# Patient Record
Sex: Female | Born: 1960 | Race: White | Hispanic: No | Smoking: Never smoker
Health system: Southern US, Community
[De-identification: ages and names within clinical notes are randomized; demographics above are authoritative.]

## PROBLEM LIST (undated history)

## (undated) DIAGNOSIS — K635 Polyp of colon: Secondary | ICD-10-CM

## (undated) DIAGNOSIS — G250 Essential tremor: Secondary | ICD-10-CM

## (undated) DIAGNOSIS — F419 Anxiety disorder, unspecified: Secondary | ICD-10-CM

## (undated) DIAGNOSIS — E669 Obesity, unspecified: Secondary | ICD-10-CM

## (undated) DIAGNOSIS — M1990 Unspecified osteoarthritis, unspecified site: Secondary | ICD-10-CM

## (undated) DIAGNOSIS — F32A Depression, unspecified: Secondary | ICD-10-CM

## (undated) DIAGNOSIS — F329 Major depressive disorder, single episode, unspecified: Secondary | ICD-10-CM

## (undated) DIAGNOSIS — G43909 Migraine, unspecified, not intractable, without status migrainosus: Secondary | ICD-10-CM

## (undated) DIAGNOSIS — K589 Irritable bowel syndrome without diarrhea: Secondary | ICD-10-CM

## (undated) HISTORY — DX: Polyp of colon: K63.5

## (undated) HISTORY — DX: Depression, unspecified: F32.A

## (undated) HISTORY — DX: Unspecified osteoarthritis, unspecified site: M19.90

## (undated) HISTORY — DX: Migraine, unspecified, not intractable, without status migrainosus: G43.909

## (undated) HISTORY — PX: APPENDECTOMY: SHX54

## (undated) HISTORY — PX: OTHER SURGICAL HISTORY: SHX169

## (undated) HISTORY — DX: Irritable bowel syndrome, unspecified: K58.9

## (undated) HISTORY — DX: Essential tremor: G25.0

## (undated) HISTORY — DX: Obesity, unspecified: E66.9

## (undated) HISTORY — DX: Major depressive disorder, single episode, unspecified: F32.9

---

## 1997-08-01 ENCOUNTER — Other Ambulatory Visit: Admission: RE | Admit: 1997-08-01 | Discharge: 1997-08-01 | Payer: Self-pay | Admitting: Obstetrics & Gynecology

## 1997-09-14 ENCOUNTER — Other Ambulatory Visit: Admission: RE | Admit: 1997-09-14 | Discharge: 1997-09-14 | Payer: Self-pay | Admitting: Obstetrics and Gynecology

## 1997-10-12 ENCOUNTER — Inpatient Hospital Stay (HOSPITAL_COMMUNITY): Admission: AD | Admit: 1997-10-12 | Discharge: 1997-10-14 | Payer: Self-pay | Admitting: Obstetrics and Gynecology

## 1998-06-12 ENCOUNTER — Other Ambulatory Visit: Admission: RE | Admit: 1998-06-12 | Discharge: 1998-06-12 | Payer: Self-pay | Admitting: Obstetrics and Gynecology

## 1999-09-09 ENCOUNTER — Encounter: Payer: Self-pay | Admitting: Obstetrics and Gynecology

## 1999-09-09 ENCOUNTER — Encounter: Admission: RE | Admit: 1999-09-09 | Discharge: 1999-09-09 | Payer: Self-pay | Admitting: Obstetrics and Gynecology

## 1999-10-08 ENCOUNTER — Other Ambulatory Visit: Admission: RE | Admit: 1999-10-08 | Discharge: 1999-10-08 | Payer: Self-pay | Admitting: Obstetrics and Gynecology

## 2001-02-15 ENCOUNTER — Other Ambulatory Visit: Admission: RE | Admit: 2001-02-15 | Discharge: 2001-02-15 | Payer: Self-pay | Admitting: Obstetrics and Gynecology

## 2002-07-11 ENCOUNTER — Other Ambulatory Visit: Admission: RE | Admit: 2002-07-11 | Discharge: 2002-07-11 | Payer: Self-pay | Admitting: Obstetrics and Gynecology

## 2002-07-13 ENCOUNTER — Ambulatory Visit (HOSPITAL_COMMUNITY): Admission: RE | Admit: 2002-07-13 | Discharge: 2002-07-13 | Payer: Self-pay | Admitting: Obstetrics and Gynecology

## 2002-07-13 ENCOUNTER — Encounter: Payer: Self-pay | Admitting: Obstetrics and Gynecology

## 2004-06-08 ENCOUNTER — Inpatient Hospital Stay (HOSPITAL_COMMUNITY): Admission: EM | Admit: 2004-06-08 | Discharge: 2004-06-09 | Payer: Self-pay | Admitting: Emergency Medicine

## 2004-07-15 ENCOUNTER — Ambulatory Visit: Payer: Self-pay | Admitting: Family Medicine

## 2004-08-01 ENCOUNTER — Encounter (INDEPENDENT_AMBULATORY_CARE_PROVIDER_SITE_OTHER): Payer: Self-pay | Admitting: Specialist

## 2004-08-01 ENCOUNTER — Ambulatory Visit (HOSPITAL_COMMUNITY): Admission: RE | Admit: 2004-08-01 | Discharge: 2004-08-01 | Payer: Self-pay | Admitting: Gastroenterology

## 2004-11-15 ENCOUNTER — Other Ambulatory Visit: Admission: RE | Admit: 2004-11-15 | Discharge: 2004-11-15 | Payer: Self-pay | Admitting: Obstetrics and Gynecology

## 2006-04-17 ENCOUNTER — Ambulatory Visit: Payer: Self-pay | Admitting: Family Medicine

## 2007-06-15 ENCOUNTER — Telehealth (INDEPENDENT_AMBULATORY_CARE_PROVIDER_SITE_OTHER): Payer: Self-pay | Admitting: *Deleted

## 2007-06-23 ENCOUNTER — Other Ambulatory Visit: Admission: RE | Admit: 2007-06-23 | Discharge: 2007-06-23 | Payer: Self-pay | Admitting: Family Medicine

## 2007-06-23 ENCOUNTER — Encounter: Payer: Self-pay | Admitting: Family Medicine

## 2007-06-23 ENCOUNTER — Ambulatory Visit: Payer: Self-pay | Admitting: Family Medicine

## 2007-06-23 DIAGNOSIS — G43829 Menstrual migraine, not intractable, without status migrainosus: Secondary | ICD-10-CM | POA: Insufficient documentation

## 2007-06-23 DIAGNOSIS — F329 Major depressive disorder, single episode, unspecified: Secondary | ICD-10-CM

## 2007-06-23 DIAGNOSIS — F32A Depression, unspecified: Secondary | ICD-10-CM | POA: Insufficient documentation

## 2007-06-23 DIAGNOSIS — F419 Anxiety disorder, unspecified: Secondary | ICD-10-CM | POA: Insufficient documentation

## 2007-06-30 ENCOUNTER — Encounter (INDEPENDENT_AMBULATORY_CARE_PROVIDER_SITE_OTHER): Payer: Self-pay | Admitting: *Deleted

## 2010-09-13 NOTE — Discharge Summary (Signed)
Sandra Mills            ACCOUNT NO.:  [id number]   MEDICAL RECORD NO.:  [id number]          PATIENT TYPE:  INP   LOCATION:  0456                         FACILITY:  [facility]   PHYSICIAN:  Anselm Pancoast. 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  Operation was drainage of perirectal abscess and sphincterotomy.   HISTORY OF PRESENT ILLNESS:  Sandra Mills is a 50 year old female,  mother of 4, who had been seen in the office of the [facility] on Friday by one of their PA's, and this had been discussed  with the gynecologist, but it was questioned whether it was a perirectal  abscess, and our office was called, and the patient was advised to come to  the emergency room and be seen by the ER physician.  The ER physician saw  her and then called me, and I examined her, and on examination, you could  feel a fullness that was really kind of lateral and in the posterior part of  the vagina but not really truly pointed perirectally, and she gave a history  that about 3 or 4 years ago, following her last delivery, she developed an  infection in her vagina which she described as a Bartholin's cyst, and said  this was in a similar area and that she thought this was what was going on.  I examined her, and as far as really on rectal exam, she was just in so much  sphincter spasm, it was difficult to tell anything, but there did appear to  be a little pustule kind of going into the labia majora on the right, and I  called, and talked with the gynecologist on call, and had her come over and  examine the patient.  Dr. Ladell Pier did come over, and she thought that this was  basically an abscess associated with her vagina, and scheduled her for the  surgery, and I asked to  also be there so I could complete my examination.  However, after she had her up in stirrups and induction of general  anesthesia, on examination, the area appeared to be a little deeper than  what she had thought when she clinically examined her.  I scrubbed in, and  with an anoscope, you could see definitely there was purulence coming from  midline anterior as if this had been basically a fistula-in-ano that had  pointed up into the intersphincteric areas, kind of really more lateral and  deeper than the usual perirectal abscess.  I put a right-angle retractor  clamp in the opening and then divided the internal sphincter so we could  actually open up into the abscess cavity and then extended the incision a  little bit, but most of it is really within the external anal canal area and  actually truly a perirectal incision.  After we were comfortable that all  the drainage had been removed, we cultured it, aerobic  and anaerobically.  She was sent to the recovery room, and we have continued IV antibiotics.  She said she was definitely feeling much better 6-8 hours after surgery, and  she has soaked in the tub, and she is afebrile, and has continued to  improve.  I examined her this morning, and now it is about 36 hours after  surgery, and there is no fluctuation nor any purulence draining.  The  incision area is clean, and there has been no bleeding.  I would like to see  her back in the office in approximately 3 days, and she is instructed if  there appears to be increasing pain or the fluctuation is reoccurring to  call or be seen sooner.  I was conservative on my drainage, fearing that we  may develop anal incontinence because of the previous deliveries and  episiotomies, etc.  The culture is not growing any bacteria, but this was  frank pus at the time of surgery, and I will wait for final results.  I am  going to discharge her on Augmentin 875 b.i.d., Vicodin for pain.  She  continues on  her chronic medications, and will soak in the tub about 3 times  a day plus p.r.n. if she has had a bowel movement.  So far, she has not  really had a good bowel movement after surgery.  She was additionally having  problems with voiding, but that has subsided.  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  She understands that she may need further drainage if  this appears to reoccur, but hopefully, this was basically a fistula that  probably started as a fissure.  She states she has had problems with what  she thought were hemorrhoids intermittently, and was from the anal dentate  line as the area of origin.      WJW/MEDQ  D:  06/09/2004  T:  06/09/2004  Job:  [id number]   cc:   Anselm Pancoast. Zachery Dakins, M.D.  [address], [address]  [place]  [place] [address]   Crist Fat. Rivard, M.D.  [address]  [place]  [place] [address]  Fax: [phone]

## 2010-09-13 NOTE — Op Note (Signed)
[name], Sandra Mills            ACCOUNT NO.:  [id number]   MEDICAL RECORD NO.:  [id number]          PATIENT TYPE:  EMS   LOCATION:  ED                           FACILITY:  [facility]   PHYSICIAN:  Anselm Pancoast. Weatherly, M.D.DATE OF BIRTH:  May 15, 1960   DATE OF PROCEDURE:  06/07/2004  DATE OF DISCHARGE:                                 OPERATIVE REPORT   PREOPERATIVE DIAGNOSIS:  Either a paravaginal abscess or possibly a  perirectal abscess.   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   OPERATION:  Evaluation under anesthesia and internal sphincterotomy and  drainage of intersphincteric abscess.   ANESTHESIA:  General anesthesia in stirrups.   HISTORY:  Sandra Sandra Mills Sandra Mills is a 49 year old female, who presented to the  emergency room after she was seen today by the PA in the [facility], and it was questionable of whether this was a perirectal or a  perivaginal abscess.  Approximately 5-6 years ago following a delivery, she  had what she described as a Bartholin cyst and says that the symptoms are  similar and that she saw the gynecologist today.  They recommended that she  get a general surgical opinion, and she was sent over to the emergency room,  and I was called and examined her.  On exam, she has definitely got a lot of  sphincter spasm and points to a little area kind of in the posterolateral  vaginal area as the area of more tenderness, and you could certainly feel a  fullness there, and there is a little of what looks like a little pustule  kind of in the labia majora at this area.  I tried to do a rectal exam, and  she is just so tender, you cannot really tell, and I talked with Dr. Estanislado Pandy,  and she came over and examined her and thought that this was basically a  vaginal abscess.  I recommended that when she took her to surgery to let me  be aware because I wanted to do a rectal examination to make  sure that it is  not a perirectal abscess and a kind of a more anterior location.  She was up  in stirrups and being examined and when she examined her, felt that this  abscess really was deeper than she had appreciated with the local  anesthesia.  I scrubbed in and put an anoscope into the anal canal and with  pushing on the abscess, she could see frank pus coming up from the midline,  dentate line area.  I put a right-angle in this, and it kind of tunnels off  to the right intersphincteric area, and this portion of the external or  internal sphincter was divided with cautery over to the abscess carrier.  As  far as getting truly into the little central localized abscess, it appears  that there are several little kind of pockets.  There is definitely nothing  that goes into the vagina, and we kind of used the [employer] and a right-  angle to kind of open into the area so that  all the pockets and fluctuation  were basically drained.  I also let the anesthesist go light, and I think  that we have done an adequate internal sphincterotomy so that we could get  good drainage and do not want to divide the skin and perianal area any  further, fearing that she possibly will be incontinent.  I think that we  should stop surgery at this point, as the abscess has been drained and the  fistula opening now opened in communication with the actual abscess cavity.  The little packing was placed into the anterior anal canal, and I am going  to continue on IV antibiotics, will reexamine her in the morning and if she  is feeling better, release her on oral antibiotics and follow her in the  office.  Hopefully, she will have anal sphincter  control and also allow adequate drainage of this abscess.  I think this is  most likely an abscess that did originate anterior intersphincteric, sort of  a fistula-in-ano, even though it is in a very anterior position, and the  actual pocket was more approximately at 10  o'clock, even though the internal  fistula opening was directly midline anterior.      WJW/MEDQ  D:  06/07/2004  T:  06/08/2004  Job:  [id number]   cc:   Dois Davenport A. Rivard, M.D.  [address]  [place]  [place] [address]  Fax: [phone]

## 2011-01-01 ENCOUNTER — Other Ambulatory Visit: Payer: Self-pay | Admitting: Internal Medicine

## 2011-01-01 ENCOUNTER — Ambulatory Visit (HOSPITAL_COMMUNITY)
Admission: RE | Admit: 2011-01-01 | Discharge: 2011-01-01 | Disposition: A | Discharge status: Home / Self Care | Payer: BC Managed Care – PPO | Source: Ambulatory Visit | Attending: Internal Medicine | Admitting: Internal Medicine

## 2011-01-01 DIAGNOSIS — K829 Disease of gallbladder, unspecified: Secondary | ICD-10-CM

## 2011-01-01 DIAGNOSIS — R1011 Right upper quadrant pain: Secondary | ICD-10-CM | POA: Insufficient documentation

## 2011-01-01 DIAGNOSIS — K7689 Other specified diseases of liver: Secondary | ICD-10-CM | POA: Insufficient documentation

## 2011-02-21 ENCOUNTER — Emergency Department (INDEPENDENT_AMBULATORY_CARE_PROVIDER_SITE_OTHER): Payer: BC Managed Care – PPO

## 2011-02-21 ENCOUNTER — Inpatient Hospital Stay (HOSPITAL_COMMUNITY): Payer: BC Managed Care – PPO

## 2011-02-21 ENCOUNTER — Encounter: Payer: Self-pay | Admitting: *Deleted

## 2011-02-21 ENCOUNTER — Emergency Department (HOSPITAL_BASED_OUTPATIENT_CLINIC_OR_DEPARTMENT_OTHER)
Admission: EM | Admit: 2011-02-21 | Discharge: 2011-02-21 | Disposition: A | Discharge status: Other Acute Inpatient Hospital | Payer: BC Managed Care – PPO | Source: Home / Self Care | Attending: Emergency Medicine | Admitting: Emergency Medicine

## 2011-02-21 ENCOUNTER — Observation Stay (HOSPITAL_COMMUNITY)
Admission: RE | Admit: 2011-02-21 | Discharge: 2011-02-22 | Disposition: A | Discharge status: Home / Self Care | Payer: BC Managed Care – PPO | Source: Other Acute Inpatient Hospital | Attending: Internal Medicine | Admitting: Internal Medicine

## 2011-02-21 ENCOUNTER — Other Ambulatory Visit: Payer: Self-pay

## 2011-02-21 DIAGNOSIS — R55 Syncope and collapse: Secondary | ICD-10-CM

## 2011-02-21 DIAGNOSIS — R197 Diarrhea, unspecified: Secondary | ICD-10-CM

## 2011-02-21 DIAGNOSIS — R51 Headache: Secondary | ICD-10-CM

## 2011-02-21 DIAGNOSIS — I951 Orthostatic hypotension: Secondary | ICD-10-CM

## 2011-02-21 DIAGNOSIS — R109 Unspecified abdominal pain: Secondary | ICD-10-CM

## 2011-02-21 DIAGNOSIS — R42 Dizziness and giddiness: Secondary | ICD-10-CM | POA: Insufficient documentation

## 2011-02-21 DIAGNOSIS — R112 Nausea with vomiting, unspecified: Secondary | ICD-10-CM

## 2011-02-21 DIAGNOSIS — E860 Dehydration: Secondary | ICD-10-CM | POA: Insufficient documentation

## 2011-02-21 DIAGNOSIS — R404 Transient alteration of awareness: Secondary | ICD-10-CM

## 2011-02-21 DIAGNOSIS — N83209 Unspecified ovarian cyst, unspecified side: Secondary | ICD-10-CM | POA: Insufficient documentation

## 2011-02-21 DIAGNOSIS — F411 Generalized anxiety disorder: Secondary | ICD-10-CM | POA: Insufficient documentation

## 2011-02-21 HISTORY — DX: Anxiety disorder, unspecified: F41.9

## 2011-02-21 LAB — COMPREHENSIVE METABOLIC PANEL
ALT: 29 U/L (ref 0–35)
Albumin: 3.9 g/dL (ref 3.5–5.2)
Alkaline Phosphatase: 73 U/L (ref 39–117)
BUN: 11 mg/dL (ref 6–23)
Chloride: 103 mEq/L (ref 96–112)
Glucose, Bld: 101 mg/dL — ABNORMAL HIGH (ref 70–99)
Potassium: 3.6 mEq/L (ref 3.5–5.1)
Sodium: 139 mEq/L (ref 135–145)
Total Bilirubin: 0.5 mg/dL (ref 0.3–1.2)

## 2011-02-21 LAB — URINALYSIS, ROUTINE W REFLEX MICROSCOPIC
Bilirubin Urine: NEGATIVE
Glucose, UA: NEGATIVE mg/dL
Hgb urine dipstick: NEGATIVE
Specific Gravity, Urine: >1.046 — ABNORMAL HIGH (ref 1.005–1.030)
Urobilinogen, UA: 0.2 mg/dL (ref 0.0–1.0)

## 2011-02-21 LAB — CARDIAC PANEL(CRET KIN+CKTOT+MB+TROPI)
CK, MB: 2 ng/mL (ref 0.3–4.0)
CK, MB: 2.4 ng/mL (ref 0.3–4.0)
Relative Index: 1.7 (ref 0.0–2.5)
Relative Index: INVALID (ref 0.0–2.5)
Total CK: 118 U/L (ref 7–177)
Troponin I: <0.3 ng/mL (ref <0.30)
Troponin I: <0.3 ng/mL (ref <0.30)
Troponin I: <0.3 ng/mL (ref <0.30)

## 2011-02-21 LAB — CBC
HCT: 41.8 % (ref 36.0–46.0)
Hemoglobin: 14.5 g/dL (ref 12.0–15.0)
RDW: 12.9 % (ref 11.5–15.5)
WBC: 9.8 10*3/uL (ref 4.0–10.5)

## 2011-02-21 LAB — PREGNANCY, URINE: Preg Test, Ur: NEGATIVE

## 2011-02-21 LAB — T3, FREE: T3, Free: 3.2 pg/mL (ref 2.3–4.2)

## 2011-02-21 LAB — LACTIC ACID, PLASMA: Lactic Acid, Venous: 1.7 mmol/L (ref 0.5–2.2)

## 2011-02-21 LAB — T4, FREE: Free T4: 1.22 ng/dL (ref 0.80–1.80)

## 2011-02-21 LAB — OCCULT BLOOD X 1 CARD TO LAB, STOOL: Fecal Occult Bld: NEGATIVE

## 2011-02-21 MED ORDER — IOHEXOL 300 MG/ML  SOLN
80.0000 mL | Freq: Once | INTRAMUSCULAR | Status: AC | PRN
  Administered 2011-02-21: 80 mL via INTRAVENOUS

## 2011-02-21 MED ORDER — IOHEXOL 300 MG/ML  SOLN
100.0000 mL | Freq: Once | INTRAMUSCULAR | Status: AC | PRN
  Administered 2011-02-21: 100 mL via INTRAVENOUS

## 2011-02-21 MED ORDER — SODIUM CHLORIDE 0.9 % IV SOLN
INTRAVENOUS | Status: DC
  Administered 2011-02-21: 1000 mL via INTRAVENOUS

## 2011-02-21 MED ORDER — ONDANSETRON HCL 4 MG/2ML IJ SOLN
4.0000 mg | Freq: Once | INTRAMUSCULAR | Status: AC
  Administered 2011-02-21: 4 mg via INTRAVENOUS
  Filled 2011-02-21: qty 2

## 2011-02-21 MED ORDER — SODIUM CHLORIDE 0.9 % IV BOLUS (SEPSIS)
1000.0000 mL | Freq: Once | INTRAVENOUS | Status: AC
  Administered 2011-02-21: 1000 mL via INTRAVENOUS

## 2011-02-21 MED ORDER — SODIUM CHLORIDE 0.9 % IV BOLUS (SEPSIS)
1000.0000 mL | Freq: Once | INTRAVENOUS | Status: DC
  Administered 2011-02-21: 1000 mL via INTRAVENOUS

## 2011-02-21 NOTE — ED Notes (Signed)
To  ED via EMS for syncopal episode

## 2011-02-21 NOTE — ED Provider Notes (Signed)
History     CSN: [id number] Arrival date & time: 02/21/2011 12:47 AM   First MD Initiated Contact with Patient 02/21/11 0055      Chief Complaint  Patient presents with  . Loss of Consciousness  . Abdominal Pain    (Consider location/radiation/quality/duration/timing/severity/associated sxs/prior treatment) HPI Comments: Patient presents by EMS after having syncopal episode at home. Patient states she felt well prior to going to bed but is initially then she began to feel nauseated. She went to the bathroom and became lightheaded, dizzy, clammy hands and had a syncopal episode that was witnessed by her husband. She did not hit her head and she lowered her to the ground. The husband reports she was out for one to 2 minutes. Since this episode patient has had 4 loose bowel movements and continues to feel very nauseated. She denies any chest pain, shortness of breath, headache.  She does admit to some lower abdominal cramping.  She denies any vaginal bleeding or discharge, dysuria, hematuria, blood in the stool.  The history is provided by the patient and the spouse.    Past Medical History  Diagnosis Date  . Anxiety     No past surgical history on file.  No family history on file.  History  Substance Use Topics  . Smoking status: Not on file  . Smokeless tobacco: Not on file  . Alcohol Use:     OB History    Grav Para Term Preterm Abortions TAB SAB Ect Mult Living                  Review of Systems  Constitutional: Positive for diaphoresis and fatigue. Negative for fever, activity change and appetite change.  HENT: Negative for rhinorrhea.   Respiratory: Negative for cough, chest tightness and shortness of breath.   Cardiovascular: Positive for syncope. Negative for chest pain.  Gastrointestinal: Positive for nausea, abdominal pain and diarrhea. Negative for vomiting.  Genitourinary: Negative for dysuria and hematuria.  Musculoskeletal: Negative for back pain.  Skin:  Negative for rash.  Neurological: Positive for dizziness, syncope, weakness and light-headedness. Negative for headaches.    Allergies  Review of patient's allergies indicates no known allergies.  Home Medications   Current Outpatient Rx  Name Route Sig Dispense Refill  . CITALOPRAM HYDROBROMIDE 10 MG PO TABS Oral Take 10 mg by mouth daily.      Marland Kitchen ONDANSETRON HCL 4 MG PO TABS Oral Take 4 mg by mouth once.        BP 113/67  Pulse 81  Temp(Src) 97.8 F (36.6 C) (Oral)  Resp 16  Ht 5\' 2"  (1.575 m)  Wt 170 lb (77.111 kg)  BMI 31.09 kg/m2  SpO2 99%  Physical Exam  Constitutional: She is oriented to person, place, and time. No distress.       Pale  HENT:  Head: Normocephalic and atraumatic.  Mouth/Throat: Oropharynx is clear and moist. No oropharyngeal exudate.       Dry Mucous membranes  Neck: Normal range of motion.  Cardiovascular: Normal rate, regular rhythm and normal heart sounds.   Pulmonary/Chest: Effort normal and breath sounds normal. No respiratory distress.  Abdominal: Soft. There is tenderness. There is no rebound and no guarding.  Musculoskeletal: Normal range of motion. She exhibits no edema and no tenderness.  Neurological: She is alert and oriented to person, place, and time. No cranial nerve deficit.  Skin: Skin is warm.    ED Course  [place] bedside Performed by: Glynn Octave  Authorized by: Glynn Octave Consent: Verbal consent obtained. Consent given by: patient Patient understanding: patient states understanding of the procedure being performed Patient identity confirmed: verbally with patient Patient tolerance: Patient tolerated the procedure well with no immediate complications. Comments: Subxiphoid and apical 4 chamber views obtained.  No pericardial effusion, no RV dilation or signs of strain.     (including critical care time)  Labs Reviewed  COMPREHENSIVE METABOLIC PANEL - Abnormal; Notable for the following:    Glucose, Bld 101 (*)     All other components within normal limits  URINALYSIS, ROUTINE W REFLEX MICROSCOPIC - Abnormal; Notable for the following:    Specific Gravity, Urine >1.046 (*)    All other components within normal limits  D-DIMER, QUANTITATIVE - Abnormal; Notable for the following:    D-Dimer, Quant 14.91 (*)    All other components within normal limits  CBC  LACTIC ACID, PLASMA  LIPASE, BLOOD  PREGNANCY, URINE  CARDIAC PANEL(CRET KIN+CKTOT+MB+TROPI)  OCCULT BLOOD X 1 CARD TO LAB, STOOL   Ct Head Wo Contrast  02/21/2011  *RADIOLOGY REPORT*  Clinical Data: Syncope; headache.  Nausea, vomiting and abdominal pain.  Loss of consciousness.  CT HEAD WITHOUT CONTRAST  Technique:  Contiguous axial images were obtained from the base of the skull through the vertex without contrast.  Comparison: None.  Findings: There is no evidence of acute infarction, mass lesion, or intra- or extra-axial hemorrhage on CT.  The posterior fossa, including the cerebellum, brainstem and fourth ventricle, is within normal limits.  The third and lateral ventricles, and basal ganglia are unremarkable in appearance.  The cerebral hemispheres are symmetric in appearance, with normal gray- white differentiation.  No mass effect or midline shift is seen.  There is no evidence of fracture; visualized osseous structures are unremarkable in appearance.  The orbits are within normal limits. The paranasal sinuses and mastoid air cells are well-aerated.  No significant soft tissue abnormalities are seen.  IMPRESSION: Unremarkable noncontrast CT of the head.  Original Report Authenticated By: Tonia Ghent, M.D.   Ct Abdomen Pelvis W Contrast  02/21/2011  *RADIOLOGY REPORT*  Clinical Data: Abdominal pain, nausea and vomiting; syncope.  CT ABDOMEN AND PELVIS WITH CONTRAST  Technique:  Multidetector CT imaging of the abdomen and pelvis was performed following the standard protocol during bolus administration of intravenous contrast.  Contrast:  OMNIPAQUE IOHEXOL 300 MG/ML IV SOLN  Comparison: Abdominal ultrasound performed 01/01/2011  Findings: The visualized lung bases are clear.  Scattered prominent cysts are noted within the liver, measuring up to 4.8 cm in size.  The liver is otherwise unremarkable in appearance.  The spleen is within normal limits.  The gallbladder is unremarkable.  The pancreas and adrenal glands are within normal limits.  The kidneys are unremarkable in appearance.  There is no evidence of hydronephrosis.  No renal or ureteral stones are seen.  No perinephric stranding is appreciated.  No free fluid is identified.  The small bowel is unremarkable in appearance.  The stomach is within normal limits.  No acute vascular abnormalities are seen.  There is minimal nonspecific stranding within the proximal mesentery.  The appendix is normal in caliber, without evidence for appendicitis.  The colon is unremarkable in appearance.  The bladder is largely decompressed and not well assessed.  A small amount of free fluid is noted within the pelvis; the distribution is slightly unusual, with a small amount of fluid noted superior to the bladder.  There is a slightly irregular cystic  focus within the right ovary, and this could conceivably reflect a recently ruptured ovarian cyst.  The ovaries are otherwise unremarkable in appearance.  No suspicious adnexal masses are seen.  No inguinal lymphadenopathy is seen.  No acute osseous abnormalities are identified.  IMPRESSION:  1.  Small amount of free fluid in the pelvis; the distribution of the fluid is slightly unusual.  Given a mildly irregular cystic focus in the right ovary, this could reflect a recently ruptured ovarian cyst. 2.  Minimal nonspecific stranding within the proximal mesentery. 3.  Prominent hepatic cysts noted.  Original Report Authenticated By: Tonia Ghent, M.D.   CRITICAL CARE Performed by: Glynn Octave   Total critical care time: 45  Critical care time was exclusive  of separately billable procedures and treating other patients.  Critical care was necessary to treat or prevent imminent or life-threatening deterioration.  Critical care was time spent personally by me on the following activities: development of treatment plan with patient and/or surrogate as well as nursing, discussions with consultants, evaluation of patient's response to treatment, examination of patient, obtaining history from patient or surrogate, ordering and performing treatments and interventions, ordering and review of laboratory studies, ordering and review of radiographic studies, pulse oximetry and re-evaluation of patient's condition.  1. Syncope   2. Orthostatic hypotension   3. Diarrhea       MDM  Syncopal episode with prodrome, now with nausea and diarrhea and lower abdominal pain.  Several episodes of diarrhea since arrival in ED and dizziness and near syncope with standing.  Denies CP, SOB, vomiting. Etiology of syncope appears to be vasovagal given ongoing diarrhea and lightheadedness.  Labs reviewed and essentially unremarkable.  Unable to tolerate orthostatic vital signs but clinically positive.  Doubt PE given hemodynamic stability, no hypoxia, chest pain, shortness of breath, tachypnea, tachycardia. No signs of right hear strain on bedside US.  Patient had already received IV contrast prior to elevated Ddimer and would not like to expose kidneys to second dose of contrast from CT chest.   Remains orthostatic and nauseated in the ED with persistent diarrhea.  Will admit for further hydration and workup of syncope.    Date: 02/21/2011  Rate: 85  Rhythm: normal sinus rhythm  QRS Axis: normal  Intervals: normal  ST/T Wave abnormalities: nonspecific ST/T changes  Conduction Disutrbances:none  Narrative Interpretation:   Old EKG Reviewed: unchanged         Glynn Octave, MD 02/21/11 [phone]

## 2011-02-21 NOTE — ED Notes (Signed)
Attempted to call report to floor nurse but unable to take report at this time. RN states she will call back later for report.

## 2011-02-21 NOTE — ED Notes (Signed)
Pt has 20 gauge IV in left hand, patent upon transfer with NS bolus infusing, IV site unremarkable.

## 2011-02-21 NOTE — ED Notes (Signed)
Pt report given to Sean, RN with [facility] 

## 2011-02-21 NOTE — ED Notes (Signed)
Pt assisted to [facility] with assist. Pt urinated small amount of clear yellow urine but no bowel movement at this time. Pt tolerated well.

## 2011-02-21 NOTE — ED Notes (Signed)
Pt report given to Jan, RN at [facility] unit 2000

## 2011-02-21 NOTE — ED Notes (Signed)
Dr Manus Gunning at bs with ultrasound machine for scan of heart and chest. Pt tolerated well.

## 2011-02-21 NOTE — ED Notes (Signed)
Husband called staff to room. Pt was sitting on the bedside commode having a bowel movement. Was pale and her head was lowered. Pt was sliding off the chair, and appeared to be having a near syncopal episode. Additional staff called to room. Pt assisted back to bed. Cleaned of stool. Pt c/o nausea. Cardiac monitor applied. Vitals obtained and stable. Fannie Knee RN at bedside to [employer]. MD made aware of episode.

## 2011-02-21 NOTE — ED Notes (Signed)
Pt room assignment changed to 2036 per charge nurse at [facility].

## 2011-02-21 NOTE — ED Notes (Signed)
Narrative of care til 0315:on arrival placed on monitor,ekg done,color pink alert and oriented.  States had 2 diarrhea stools today,one earlier and another before syncopal episode.  Up to bsc and tolerated well.  Back up to bsc and had a syncopal episode,very pale,placed back in bed.  Given meds for nausea ,bolus of nss continues.  Color pink again,alert and oriented.  Up to bsc again for urine and tolerated well.  Again felt increased nausea ,epigastric pain and flushed.  Placed on bedpan, another brown liquid stool.

## 2011-02-22 LAB — COMPREHENSIVE METABOLIC PANEL
ALT: 20 U/L (ref 0–35)
Albumin: 3 g/dL — ABNORMAL LOW (ref 3.5–5.2)
Alkaline Phosphatase: 64 U/L (ref 39–117)
Potassium: 4 mEq/L (ref 3.5–5.1)
Sodium: 140 mEq/L (ref 135–145)
Total Protein: 6 g/dL (ref 6.0–8.3)

## 2011-02-22 LAB — DIFFERENTIAL
Basophils Absolute: 0 10*3/uL (ref 0.0–0.1)
Basophils Relative: 0 % (ref 0–1)
Eosinophils Relative: 2 % (ref 0–5)
Monocytes Absolute: 0.4 10*3/uL (ref 0.1–1.0)
Neutro Abs: 2.7 10*3/uL (ref 1.7–7.7)

## 2011-02-22 LAB — CBC
Hemoglobin: 12.1 g/dL (ref 12.0–15.0)
MCHC: 33.3 g/dL (ref 30.0–36.0)
RDW: 13.7 % (ref 11.5–15.5)

## 2011-02-23 NOTE — H&P (Signed)
Sandra Mills, FAROOQ NO.:  [id number]  MEDICAL RECORD NO.:  [id number]  LOCATION:  2036                         FACILITY:  [facility]  PHYSICIAN:  Talmage Nap, MD  DATE OF BIRTH:  Dec 31, 1960  DATE OF ADMISSION:  02/21/2011 DATE OF DISCHARGE:                             HISTORY & PHYSICAL   PRIMARY CARE PHYSICIAN:  Unknown.  History obtainable from the patient.  CHIEF COMPLAINT:  Dizzy spell, nausea, and passing out of 1 day's duration.  The patient is a 50 year old Caucasian female with history of anxiety disorder, on citalopram, was said to have been in stable health until evening prior to presenting to the emergency room when she was at rest and suddenly developed a dizzy spell.  The dizziness was said to be associated with nausea.  She denied any history of chest pain.  She denied any history of shortness of breath.  She denied any nausea or vomiting.  The patient however says she had some abdominal discomfort and thereafter had multiple episodes of watery stool.  During this period, the patient claimed that she passed out for unspecified duration of time.  She denied any history of urinary or fecal incontinence.  She denied any PND or orthopnea.  The dizzy spell and nausea and were said to have persisted and with a feeling of trying to pass out for the second time and subsequently presented to [facility].  At [facility], the patient was said to be orthostatic and tachycardic, given boluses of IV normal saline, and blood pressure was stabilized and subsequently transferred to [facility] for further evaluation.  PAST MEDICAL HISTORY:  Positive for anxiety disorder.  PAST SURGICAL HISTORY:Ovarian cystectomy.  PREADMISSION MEDICATION:  Includes citalopram 10 mg p.o. daily.  ALLERGIES:  She has no known allergies.  SOCIAL HISTORY:  Negative for alcohol or tobacco use, and the patient is a Runner, broadcasting/film/video.  FAMILY HISTORY:   Said to positive for breast CA.  REVIEW OF SYSTEMS:  The patient complained about dizzy spells with nausea.  She denies any vomiting.  She denies any chest pain.  She denies any shortness of breath.  She denies any cough.  She denies any systemic symptoms, i.e., no fever, no chills, no rigor.  She complained of vague abdominal discomfort with episodes of loose stools.  She denies any dysuria or hematuria.  No swelling of the lower extremities.  No intolerance to heat or cold and no neuropsychiatric disorder.  PHYSICAL EXAMINATION:  GENERAL:  Middle-aged lady with suboptimal hydration, not in any obvious respiratory distress. VITAL SIGNS:  At present, blood pressure is 115/73, temperature is 96.6, pulse is 80, respiratory rate 18. HEENT:  Pupils are reactive to light and extraocular muscles are intact. NECK:  She has no jugular venous distention.  No carotid bruit and no lymphadenopathy. CHEST:  Clear to auscultation. HEART:  Sounds are 1 and 2. ABDOMEN:  Soft with vague periumbilical tenderness.  Liver, spleen, andkidney not palpable.  Bowel sounds are positive. EXTREMITIES:  No pedal edema. NEUROLOGIC:  Nonfocal. MUSCULOSKELETAL SYSTEM:  Unremarkable. SKIN:  Slightly decreased turgor.  LABORATORY DATA:  Initial complete blood count with no differential showed WBC of 9.8, hemoglobin  of 14.5, hematocrit of 41.8, MCV of 89.7 and platelet count of 287.  Venous lactic acid is 1.7.  Comprehensive metabolic panel showed sodium of 139, potassium of 3.6, chloride of 102 with a bicarb of 24, glucose is 101, BUN is 11, creatinine 0.60.  LFTs are normal.  Lipase 37.  First set of cardiac markers, troponin-I less than 0.30.  Fecal occult blood test was negative.  Urinalysis: Unremarkable.  Urine pregnancy test is negative.  D-dimer is 14.91. Imaging studies done include CT of the head without contrast normal, and CT of the abdomen and pelvis with contrast showed small amount of fluid in the  pelvis.  There is a regular cystic lesion in the right ovary. This could represent ruptured ovarian cyst.  There is minimal nonspecific stranding within the proximal mesentery.  IMPRESSION: 1. Syncope, etiology is unknown. 2. Dehydration. 3. Anxiety disorder.  PLAN:  Admit the patient to telemetry.  The patient will be slowly rehydrated with normal saline IV to go at a rate of 55 mL an hour.  Her nausea will be controlled with Zofran 4 mg IV q.6 h. p.r.n.  She will also be on Dilaudid 1 mg IV q.4 h. p.r.n. for pain and since the patient has vague periumbilical tenderness, she will be on Levsin 1 tablet p.o. t.i.d.  She will be restarted on citalopram 10 mg p.o. for anxiety disorder.  GI prophylaxis will be done with Protonix 40 mg IV q.24 h., and DVT prophylaxis with Lovenox 40 mg subcu q.24 h.  Further workup to be done on this patient will include cardiac enzymes q.6 h. x3; thyroid panel which will include TSH, T3, and T4; EKG stat and a magnesium level stat will also be ordered.  CBC, CMP, and magnesium will be repeated in the a.m.  Imaging studies to be ordered will include venous duplex of the lower extremities, CT thorax with contrast to rule out PE, and 2-D echo and carotid duplex.  The patient will be re-evaluated with lab results, and she will be followed on a daily basis.     Talmage Nap, MD     CN/MEDQ  D:  02/21/2011  T:  02/21/2011  Job:  [id number]  Electronically Signed by Talmage Nap  on 02/23/2011 11:20:22 AM

## 2011-02-28 NOTE — Consult Note (Signed)
Sandra Mills, [name]            ACCOUNT NO.:  [id number]  MEDICAL RECORD NO.:  [id number]  LOCATION:  [facility]                         FACILITY:  [facility]  PHYSICIAN:  Chadley Dziedzic A. Clytie Shetley, M.D. DATE OF BIRTH:  [birth year]  DATE OF CONSULTATION: DATE OF DISCHARGE:  02/21/2011                                CONSULTATION   CHIEF COMPLAINT:  Syncope and questionable ruptured ovarian cyst found on CT scan.  HISTORY OF PRESENT ILLNESS:  The patient is a 50 year old, gravida 4, para 4, who presented to the emergency room after a syncopal episode early this morning.  The patient stated that over the last couple of weeks she has had nausea and vomiting and diarrhea.  She has had no recent travel.  Denies having any fevers and says that there is no blood in her diarrhea, however, she felt nauseous earlier this morning and was running to the bathroom and then passed out.  She was then admitted to the hospital.  On admission to the hospital, her CT scan was found to have some free fluid in the pelvis and a questionable right ovarian focus.  There is no dimensions given on this focus, but it says no adnexal masses are seen and normal-appearing uterus.  GYN HISTORY:  Had menarche in her early teens, it comes every month every 28 days and lasting 4 days.  She changes the pad every 2-4 hours. No cramping.  She denies any history of sexually transmitted diseases.  PAST MEDICAL HISTORY:  She denies having any past medical history.  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  FAMILY HISTORY:  Her mother died of breast cancer.  The patient's mammogram was many years ago.  Her last Pap smear was normal.  SOCIAL HISTORY:  Negative for tobacco and illicit drug use.  Occasional alcohol use.  The patient is married and with children, and so she is in a safe environment.  PHYSICAL EXAMINATION:  VITAL SIGNS:  She is afebrile with stable vital signs. GENERAL:  She is  well-developed, well-nourished female, in no apparent distress. HEART:  Regular rate and rhythm. LUNGS:  Clear to auscultation bilaterally. ABDOMEN:  Soft and nontender. GENITOURINARY:  I was unable to do a speculum exam as she is in the bed, but I did a pelvic exam.  Uterus is top-normal size with no adnexal masses.  No cervical motion tenderness. EXTREMITIES:  No cyanosis, clubbing, or edema. BREAST:  No masses bilaterally, no nipple discharge, and no axillary lymphadenopathy bilaterally.  PERTINENT LABORATORY DATA:  Her hemoglobin is stable at 14.8.  Her urine pregnancy test was negative and T3-T4 within normal limits.  ASSESSMENT:  Syncopal episode with nausea and vomiting, diarrhea, and free fluid found on pelvic CT.  The patient's exam was benign even if she had a ruptured cyst.  PLAN:  Will be to follow up with [facility] number [phone] in 4-6 weeks with the ultrasound.  I will have my office call and make an appointment for her.  She also needs a mammogram especially given her family history.  The patient was very pleasant and nice to be acquainted with.We thank you very much for this consult.  Gidget Quizhpi A. Normand Sloop, M.D.     NAD/MEDQ  D:  02/21/2011  T:  02/22/2011  Job:  [id number]  Electronically Signed by Jaymes Graff M.D. on 02/28/2011 03:15:13 PM

## 2011-05-24 ENCOUNTER — Ambulatory Visit (INDEPENDENT_AMBULATORY_CARE_PROVIDER_SITE_OTHER): Payer: BC Managed Care – PPO

## 2011-05-24 DIAGNOSIS — F329 Major depressive disorder, single episode, unspecified: Secondary | ICD-10-CM

## 2011-05-24 DIAGNOSIS — F3289 Other specified depressive episodes: Secondary | ICD-10-CM

## 2011-12-10 ENCOUNTER — Ambulatory Visit (INDEPENDENT_AMBULATORY_CARE_PROVIDER_SITE_OTHER): Payer: BC Managed Care – PPO | Admitting: Family Medicine

## 2011-12-10 VITALS — BP 106/68 | HR 82 | Temp 98.4°F | Resp 16 | Ht 63.5 in | Wt 183.6 lb

## 2011-12-10 DIAGNOSIS — Z124 Encounter for screening for malignant neoplasm of cervix: Secondary | ICD-10-CM

## 2011-12-10 DIAGNOSIS — Z0000 Encounter for general adult medical examination without abnormal findings: Secondary | ICD-10-CM

## 2011-12-10 DIAGNOSIS — R197 Diarrhea, unspecified: Secondary | ICD-10-CM

## 2011-12-10 DIAGNOSIS — F329 Major depressive disorder, single episode, unspecified: Secondary | ICD-10-CM

## 2011-12-10 DIAGNOSIS — F32A Depression, unspecified: Secondary | ICD-10-CM

## 2011-12-10 DIAGNOSIS — F341 Dysthymic disorder: Secondary | ICD-10-CM

## 2011-12-10 DIAGNOSIS — Z23 Encounter for immunization: Secondary | ICD-10-CM

## 2011-12-10 LAB — CBC WITH DIFFERENTIAL/PLATELET
Eosinophils Absolute: 0.1 10*3/uL (ref 0.0–0.7)
Eosinophils Relative: 1 % (ref 0–5)
Hemoglobin: 13.6 g/dL (ref 12.0–15.0)
Lymphs Abs: 2.9 10*3/uL (ref 0.7–4.0)
MCH: 31 pg (ref 26.0–34.0)
MCV: 88.6 fL (ref 78.0–100.0)
Monocytes Relative: 7 % (ref 3–12)
Neutrophils Relative %: 59 % (ref 43–77)
RBC: 4.39 MIL/uL (ref 3.87–5.11)

## 2011-12-10 LAB — COMPREHENSIVE METABOLIC PANEL
Albumin: 4.7 g/dL (ref 3.5–5.2)
CO2: 24 mEq/L (ref 19–32)
Calcium: 9.7 mg/dL (ref 8.4–10.5)
Chloride: 104 mEq/L (ref 96–112)
Glucose, Bld: 74 mg/dL (ref 70–99)
Sodium: 136 mEq/L (ref 135–145)
Total Bilirubin: 0.5 mg/dL (ref 0.3–1.2)
Total Protein: 7.8 g/dL (ref 6.0–8.3)

## 2011-12-10 LAB — POCT URINALYSIS DIPSTICK
Bilirubin, UA: NEGATIVE
Glucose, UA: NEGATIVE
Spec Grav, UA: <=1.005

## 2011-12-10 LAB — VITAMIN B12: Vitamin B-12: 518 pg/mL (ref 211–911)

## 2011-12-10 MED ORDER — CITALOPRAM HYDROBROMIDE 40 MG PO TABS: 40.0000 mg | ORAL_TABLET | Freq: Every day | ORAL | Status: AC

## 2011-12-10 NOTE — Patient Instructions (Addendum)
1. Routine general medical examination at a health care facility  IFOBT POC (occult bld, rslt in office), POCT UA - Microscopic Only, POCT urinalysis dipstick, CBC with Differential, Comprehensive metabolic panel, Vitamin B12, Vitamin D 25 hydroxy, TSH, EKG 12-Lead  2. Screening for malignant neoplasm of the cervix  Pap IG and HPV (high risk) DNA detection  3. Need for diphtheria-tetanus-pertussis (Tdap) vaccine, adult/adolescent  Tdap vaccine greater than or equal to 7yo IM  4. Need for hepatitis B vaccination  Hepatitis B vaccine adult IM  5. Anxiety and depression  citalopram (CELEXA) 40 MG tablet    We will be scheduling you for a screening mammogram.  WILL NEED TO RETURN IN ONE MONTH FOR 2ND HEPATITIS B VACCINE (APPROXIMATELY AFTER 01/11/12)  Recommend weight loss, continued exercise.   Recommend 3 servings of dairy daily.  Recommend dermatology evaluation in upcoming year.   Recommend evaluation by GI for persistent diarrhea.

## 2011-12-10 NOTE — Progress Notes (Signed)
Subjective:    Patient ID: Sandra Mills, female    DOB: [birth year], 51 y.o.   MRN: [id number]  HPI This 51 y.o. female presents for evaluation of CPE.  Last physical 2010.  Pap smear 3 years ago; normal; no abnormalities.  Mammogram x 1 baseline several years ago.  Colonoscopy 2007; normal.  Tetanus unknown.  Hepatitis B series unknown. Influenza vaccine yearly.  Eye exam 2013; no g/c.  Dental exam every six months.   Regular menses (4-5 days, heavy, cramping).    Diarrhea: onset 6 months ago; daily occurrence; non-bloody; no mucous; no foreign travel; no camping; no antibiotic use before onset of diarrhea.  No associated abdominal pain, nausea, vomiting, constipation. Colonoscopy 2007 and normal.   PMH:  Anxiety/depression, Migraines, Essential Tremor, Regular Menses. Social: married; 4 children; Runner, broadcasting/film/video in school; no tobacco/alcohol/drugs; started [employer] in past several months.   Past Medical History  Diagnosis Date  . Anxiety     No past surgical history on file.  Prior to Admission medications   Medication Sig Start Date End Date Taking? Authorizing Provider  citalopram (CELEXA) 10 MG tablet Take 10 mg by mouth daily.     Yes Historical Provider, MD  ondansetron (ZOFRAN) 4 MG tablet Take 4 mg by mouth once.      Historical Provider, MD    No Known Allergies  History   Social History  . Marital Status: Married    Spouse Name: N/A    Number of Children: N/A  . Years of Education: N/A   Occupational History  . Not on file.   Social History Main Topics  . Smoking status: Never Smoker   . Smokeless tobacco: Not on file  . Alcohol Use: Not on file  . Drug Use: Not on file  . Sexually Active: Not on file   Other Topics Concern  . Not on file   Social History Narrative  . No narrative on file    No family history on file.   Review of Systems  Constitutional: Negative for fever, chills, diaphoresis, activity change, appetite change, fatigue and unexpected  weight change.  HENT: Negative for hearing loss, ear pain, nosebleeds, congestion, rhinorrhea, sneezing, neck pain, neck stiffness and tinnitus.   Eyes: Negative for photophobia, redness and visual disturbance.  Respiratory: Negative for chest tightness, shortness of breath and wheezing.   Cardiovascular: Negative for chest pain, palpitations and leg swelling.  Gastrointestinal: Positive for diarrhea. Negative for nausea, vomiting, abdominal pain, constipation and abdominal distention.  Genitourinary: Negative for dysuria, frequency, hematuria, flank pain, vaginal discharge, genital sores, vaginal pain, menstrual problem, pelvic pain and dyspareunia.  Musculoskeletal: Negative for joint swelling, arthralgias and gait problem.  Skin: Negative for color change, pallor and rash.  Neurological: Positive for tremors. Negative for dizziness, seizures, syncope, weakness, light-headedness, numbness and headaches.  Hematological: Negative for adenopathy. Does not bruise/bleed easily.  Psychiatric/Behavioral: Positive for dysphoric mood. Negative for suicidal ideas, hallucinations, behavioral problems, confusion, self-injury, decreased concentration and agitation. The patient is nervous/anxious. The patient is not hyperactive.        Objective:   Physical Exam  Constitutional: She is oriented to person, place, and time. She appears well-developed and well-nourished. No distress.  HENT:  Head: Normocephalic and atraumatic.  Right Ear: External ear normal.  Left Ear: External ear normal.  Nose: Nose normal.  Mouth/Throat: Oropharynx is clear and moist.  Eyes: Conjunctivae and EOM are normal. Pupils are equal, round, and reactive to light. No scleral icterus.  Neck:  Normal range of motion. Neck supple. No thyromegaly present.  Cardiovascular: Normal rate, regular rhythm, normal heart sounds and intact distal pulses.  Exam reveals no gallop and no friction rub.   No murmur heard. Pulmonary/Chest:  Effort normal and breath sounds normal. No respiratory distress. She has no wheezes. She has no rales.  Abdominal: Soft. Bowel sounds are normal. She exhibits no distension and no mass. There is no tenderness. There is no rebound and no guarding.  Genitourinary: Vagina normal and uterus normal. No vaginal discharge found.  Musculoskeletal: Normal range of motion. She exhibits no edema and no tenderness.  Lymphadenopathy:    She has no cervical adenopathy.  Neurological: She is alert and oriented to person, place, and time. She has normal reflexes. No cranial nerve deficit. She exhibits normal muscle tone.  Skin: Skin is warm and dry. She is not diaphoretic.  Psychiatric: She has a normal mood and affect. Her behavior is normal. Judgment and thought content normal.    Results for orders placed in visit on 12/10/11  POCT URINALYSIS DIPSTICK      Component Value Range   Color, UA yellow     Clarity, UA cloudy     Glucose, UA neg     Bilirubin, UA neg     Ketones, UA neg     Spec Grav, UA <=1.005     Blood, UA moderate     pH, UA 5.0     Protein, UA neg     Urobilinogen, UA 0.2     Nitrite, UA neg     Leukocytes, UA Trace    CBC WITH DIFFERENTIAL      Component Value Range   WBC 8.6  4.0 - 10.5 K/uL   RBC 4.39  3.87 - 5.11 MIL/uL   Hemoglobin 13.6  12.0 - 15.0 g/dL   HCT 16.1  09.6 - 04.5 %   MCV 88.6  78.0 - 100.0 fL   MCH 31.0  26.0 - 34.0 pg   MCHC 35.0  30.0 - 36.0 g/dL   RDW 40.9  81.1 - 91.4 %   Platelets 317  150 - 400 K/uL   Neutrophils Relative 59  43 - 77 %   Neutro Abs 5.0  1.7 - 7.7 K/uL   Lymphocytes Relative 33  12 - 46 %   Lymphs Abs 2.9  0.7 - 4.0 K/uL   Monocytes Relative 7  3 - 12 %   Monocytes Absolute 0.6  0.1 - 1.0 K/uL   Eosinophils Relative 1  0 - 5 %   Eosinophils Absolute 0.1  0.0 - 0.7 K/uL   Basophils Relative 0  0 - 1 %   Basophils Absolute 0.0  0.0 - 0.1 K/uL   Smear Review Criteria for review not met    COMPREHENSIVE METABOLIC PANEL       Component Value Range   Sodium 136  135 - 145 mEq/L   Potassium 4.6  3.5 - 5.3 mEq/L   Chloride 104  96 - 112 mEq/L   CO2 24  19 - 32 mEq/L   Glucose, Bld 74  70 - 99 mg/dL   BUN 17  6 - 23 mg/dL   Creat 7.82  9.56 - 2.13 mg/dL   Total Bilirubin 0.5  0.3 - 1.2 mg/dL   Alkaline Phosphatase 70  39 - 117 U/L   AST 18  0 - 37 U/L   ALT 19  0 - 35 U/L   Total Protein 7.8  6.0 - 8.3 g/dL   Albumin 4.7  3.5 - 5.2 g/dL   Calcium 9.7  8.4 - 16.1 mg/dL  VITAMIN W96      Component Value Range   Vitamin B-12 518  211 - 911 pg/mL  VITAMIN D 25 HYDROXY      Component Value Range   Vit D, 25-Hydroxy 36  30 - 89 ng/mL  TSH      Component Value Range   TSH 2.103  0.350 - 4.500 uIU/mL  PAP IG AND HPV HIGH-RISK      Component Value Range   HPV DNA High Risk       Specimen adequacy:       FINAL DIAGNOSIS:       Cytotechnologist:             Assessment & Plan:   1. Routine general medical examination at a health care facility  IFOBT POC (occult bld, rslt in office), POCT UA - Microscopic Only, POCT urinalysis dipstick, CBC with Differential, Comprehensive metabolic panel, Vitamin B12, Vitamin D 25 hydroxy, TSH, EKG 12-Lead  2. Screening for malignant neoplasm of the cervix  Pap IG and HPV (high risk) DNA detection  3. Need for diphtheria-tetanus-pertussis (Tdap) vaccine, adult/adolescent  Tdap vaccine greater than or equal to 7yo IM  4. Need for hepatitis B vaccination  Hepatitis B vaccine adult IM  5. Anxiety and depression  citalopram (CELEXA) 40 MG tablet  6.  Diarrhea              GI Referral   1.  CPE;  Anticipatory guidance --- weight loss, exercise.  Pap smear obtained; refer for mammogram. Stool negative for blood; colonoscopy UTD.  Immunizations---s/p TDAP in office; s/p Hepatitis B#1 in office.  Obtain labs. 2.  Pap smear obtained and current guidelines reviewed during visit. 3.  TDAP 4.  Hepatitis B#1:  RTC 1 month for Hepatitis B#2.  School Runner, broadcasting/film/video. 5.  Anxiety and depression:  worsening; agreeable to increasing medication to 40mg  daily.  Rx provided. 7. Diarrhea: onset six months ago with persistent; no associated symptoms; refer to GI.

## 2011-12-11 LAB — VITAMIN D 25 HYDROXY (VIT D DEFICIENCY, FRACTURES): Vit D, 25-Hydroxy: 36 ng/mL (ref 30–89)

## 2011-12-13 ENCOUNTER — Encounter: Payer: Self-pay | Admitting: Family Medicine

## 2011-12-15 LAB — PAP IG AND HPV HIGH-RISK

## 2011-12-19 NOTE — Progress Notes (Signed)
Reviewed and agree.

## 2012-11-30 ENCOUNTER — Other Ambulatory Visit: Payer: Self-pay

## 2012-11-30 ENCOUNTER — Other Ambulatory Visit: Payer: Self-pay | Admitting: Family Medicine

## 2012-11-30 DIAGNOSIS — Z1231 Encounter for screening mammogram for malignant neoplasm of breast: Secondary | ICD-10-CM

## 2012-12-07 ENCOUNTER — Ambulatory Visit
Admission: RE | Admit: 2012-12-07 | Discharge: 2012-12-07 | Disposition: A | Discharge status: Home / Self Care | Payer: BC Managed Care – PPO | Source: Ambulatory Visit

## 2012-12-07 DIAGNOSIS — Z1231 Encounter for screening mammogram for malignant neoplasm of breast: Secondary | ICD-10-CM

## 2012-12-08 ENCOUNTER — Other Ambulatory Visit: Payer: Self-pay | Admitting: Family Medicine

## 2012-12-08 DIAGNOSIS — R928 Other abnormal and inconclusive findings on diagnostic imaging of breast: Secondary | ICD-10-CM

## 2012-12-09 DIAGNOSIS — K635 Polyp of colon: Secondary | ICD-10-CM

## 2012-12-09 HISTORY — PX: COLONOSCOPY W/ POLYPECTOMY: SHX1380

## 2012-12-09 HISTORY — DX: Polyp of colon: K63.5

## 2012-12-24 ENCOUNTER — Ambulatory Visit
Admission: RE | Admit: 2012-12-24 | Discharge: 2012-12-24 | Disposition: A | Discharge status: Home / Self Care | Payer: BC Managed Care – PPO | Source: Ambulatory Visit | Attending: Family Medicine | Admitting: Family Medicine

## 2012-12-24 DIAGNOSIS — R928 Other abnormal and inconclusive findings on diagnostic imaging of breast: Secondary | ICD-10-CM

## 2012-12-28 ENCOUNTER — Encounter: Payer: Self-pay | Admitting: Radiology

## 2013-01-05 ENCOUNTER — Ambulatory Visit (INDEPENDENT_AMBULATORY_CARE_PROVIDER_SITE_OTHER): Payer: BC Managed Care – PPO | Admitting: Family Medicine

## 2013-01-05 ENCOUNTER — Encounter: Payer: Self-pay | Admitting: Family Medicine

## 2013-01-05 VITALS — BP 114/62 | HR 84 | Temp 99.2°F | Resp 16 | Ht 63.5 in | Wt 206.0 lb

## 2013-01-05 DIAGNOSIS — R928 Other abnormal and inconclusive findings on diagnostic imaging of breast: Secondary | ICD-10-CM

## 2013-01-05 DIAGNOSIS — Z23 Encounter for immunization: Secondary | ICD-10-CM

## 2013-01-05 DIAGNOSIS — R921 Mammographic calcification found on diagnostic imaging of breast: Secondary | ICD-10-CM

## 2013-01-05 DIAGNOSIS — M25512 Pain in left shoulder: Secondary | ICD-10-CM

## 2013-01-05 DIAGNOSIS — M25519 Pain in unspecified shoulder: Secondary | ICD-10-CM

## 2013-01-05 DIAGNOSIS — Z803 Family history of malignant neoplasm of breast: Secondary | ICD-10-CM

## 2013-01-05 DIAGNOSIS — Z0000 Encounter for general adult medical examination without abnormal findings: Secondary | ICD-10-CM

## 2013-01-05 LAB — CBC WITH DIFFERENTIAL/PLATELET
Basophils Absolute: 0 K/uL (ref 0.0–0.1)
Basophils Relative: 0 % (ref 0–1)
Eosinophils Absolute: 0.1 K/uL (ref 0.0–0.7)
Eosinophils Relative: 1 % (ref 0–5)
HCT: 36.4 % (ref 36.0–46.0)
Hemoglobin: 12.4 g/dL (ref 12.0–15.0)
Lymphocytes Relative: 31 % (ref 12–46)
Lymphs Abs: 2.8 K/uL (ref 0.7–4.0)
MCH: 30.3 pg (ref 26.0–34.0)
MCHC: 34.1 g/dL (ref 30.0–36.0)
MCV: 89 fL (ref 78.0–100.0)
Monocytes Absolute: 0.6 K/uL (ref 0.1–1.0)
Monocytes Relative: 6 % (ref 3–12)
Neutro Abs: 5.4 K/uL (ref 1.7–7.7)
Neutrophils Relative %: 62 % (ref 43–77)
Platelets: 372 K/uL (ref 150–400)
RBC: 4.09 MIL/uL (ref 3.87–5.11)
RDW: 15.4 % (ref 11.5–15.5)
WBC: 8.9 K/uL (ref 4.0–10.5)

## 2013-01-05 LAB — POCT URINALYSIS DIPSTICK
Bilirubin, UA: NEGATIVE
Glucose, UA: NEGATIVE
Ketones, UA: NEGATIVE
Spec Grav, UA: 1.015

## 2013-01-05 LAB — COMPREHENSIVE METABOLIC PANEL WITH GFR
ALT: 24 U/L (ref 0–35)
AST: 20 U/L (ref 0–37)
Albumin: 4.5 g/dL (ref 3.5–5.2)
Alkaline Phosphatase: 78 U/L (ref 39–117)
BUN: 12 mg/dL (ref 6–23)
CO2: 25 meq/L (ref 19–32)
Calcium: 9.2 mg/dL (ref 8.4–10.5)
Chloride: 101 meq/L (ref 96–112)
Creat: 0.64 mg/dL (ref 0.50–1.10)
Glucose, Bld: 81 mg/dL (ref 70–99)
Potassium: 4 meq/L (ref 3.5–5.3)
Sodium: 136 meq/L (ref 135–145)
Total Bilirubin: 0.5 mg/dL (ref 0.3–1.2)
Total Protein: 7.7 g/dL (ref 6.0–8.3)

## 2013-01-05 LAB — LIPID PANEL
Cholesterol: 191 mg/dL (ref 0–200)
HDL: 59 mg/dL
LDL Cholesterol: 89 mg/dL (ref 0–99)
Total CHOL/HDL Ratio: 3.2 ratio
Triglycerides: 217 mg/dL — ABNORMAL HIGH
VLDL: 43 mg/dL — ABNORMAL HIGH (ref 0–40)

## 2013-01-05 LAB — HEMOGLOBIN A1C: Mean Plasma Glucose: 105 mg/dL (ref <117)

## 2013-01-05 MED ORDER — CITALOPRAM HYDROBROMIDE 40 MG PO TABS: 40.0000 mg | ORAL_TABLET | Freq: Every day | ORAL | Status: DC

## 2013-01-05 NOTE — Patient Instructions (Addendum)

## 2013-01-05 NOTE — Progress Notes (Signed)
[address]   [place], [place]  [address]   [phone]  Subjective:    Patient ID: Sandra Mills, female    DOB: 12/01/1960, 52 y.o.   MRN: [id number]  HPI This 52 y.o. female presents for CPE.  Last physical 12/08/11.   Pap smear 12/08/11. Mammogram 12/10/11.  Scheduled repeat in six months due to calcifications in B breasts.   Colonoscopy 12/09/12 polyps three and two precancerous polyps; Hung.  Repeat in 3-5 years. TDAP 12/10/11. Flu vaccine today. Hepatitis B #1 12/10/11. Eye exam 11/2012; +glasses. Dental exam 10/2012.  IBS diarrhea: s/p colonoscopy; recommend preplanned Imodium.  Diarrhea x 3-4 times per day for two years.    L shoulder pain:  Ongoing for some time; pain with certain movements.    Breast calcification and family history of breast cancer in mother: concerned if needs genetic testing.    Review of Systems  Constitutional: Positive for unexpected weight change. Negative for fever, chills, diaphoresis, activity change, appetite change and fatigue.  HENT: Negative for congestion, dental problem, drooling, ear discharge, ear pain, facial swelling, hearing loss, mouth sores, nosebleeds, postnasal drip, rhinorrhea, sinus pressure, sneezing, sore throat and tinnitus.   Eyes: Negative for photophobia, pain, discharge, redness, itching and visual disturbance.  Respiratory: Negative for cough, choking, shortness of breath, wheezing and stridor.   Cardiovascular: Negative for chest pain, palpitations and leg swelling.  Gastrointestinal: Positive for diarrhea. Negative for nausea, vomiting, abdominal pain, constipation, blood in stool, abdominal distention, anal bleeding and rectal pain.  Endocrine: Negative for cold intolerance, heat intolerance, polydipsia, polyphagia and polyuria.  Genitourinary: Negative for dysuria, urgency, frequency, hematuria, flank pain, decreased urine volume, vaginal bleeding, vaginal discharge, enuresis, difficulty urinating, genital sores,  vaginal pain, pelvic pain and dyspareunia.  Musculoskeletal: Negative for arthralgias, back pain, gait problem, joint swelling, myalgias, neck pain and neck stiffness.  Skin: Negative for color change, pallor, rash and wound.  Neurological: Negative for dizziness, tremors, seizures, syncope, facial asymmetry, speech difficulty, weakness, light-headedness, numbness and headaches.  Hematological: Does not bruise/bleed easily.  Psychiatric/Behavioral: Negative for suicidal ideas, sleep disturbance, dysphoric mood and decreased concentration. The patient is not nervous/anxious.    Past Medical History  Diagnosis Date  . Anxiety   . Tremor, essential   . Migraine     menstrually related.  . Colon polyps 12/09/12    three polyps; Elnoria Howard; repeat in 3-5 years.   Past Surgical History  Procedure Laterality Date  . Cyst resection perineal    . Colonoscopy w/ polypectomy  12/09/12    three polyps; Hung. Repeat in 3-5 years.   No Known Allergies No current outpatient prescriptions on file prior to visit.   No current facility-administered medications on file prior to visit.   History   Social History  . Marital Status: Married    Spouse Name: N/A    Number of Children: N/A  . Years of Education: N/A   Occupational History  . Not on file.   Social History Main Topics  . Smoking status: Never Smoker   . Smokeless tobacco: Not on file  . Alcohol Use: No  . Drug Use: No  . Sexual Activity: Yes   Other Topics Concern  . Not on file   Social History Narrative   Married x 28 years; happily married; no abuse.   Lives with husband, 4 children (21, 35, 96, 69).  No grandchildren.   4 children; no grandchildren.   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  Previously worked [employer].   No tobacco/alcohol/drugs   Exercise sporadic; curves and [employer].   .     Family History  Problem Relation Age of Onset  . Cancer Mother 42    Breast cancer  . Atrial fibrillation Father   . Atrial  fibrillation Sister       Objective:   Physical Exam  Nursing note and vitals reviewed. Constitutional: She is oriented to person, place, and time. She appears well-developed and well-nourished. No distress.  HENT:  Head: Normocephalic and atraumatic.  Right Ear: External ear normal.  Left Ear: External ear normal.  Nose: Nose normal.  Mouth/Throat: Oropharynx is clear and moist.  Eyes: Conjunctivae and EOM are normal. Pupils are equal, round, and reactive to light.  Neck: Normal range of motion. Neck supple. No thyromegaly present.  Cardiovascular: Normal rate, regular rhythm, normal heart sounds and intact distal pulses.  Exam reveals no gallop.   No murmur heard. Pulmonary/Chest: Effort normal and breath sounds normal. She has no wheezes. She has no rales.  Abdominal: Soft. Bowel sounds are normal. She exhibits no distension. There is no tenderness. There is no rebound and no guarding.  Genitourinary: Vagina normal and uterus normal. No breast swelling, tenderness, discharge or bleeding. There is no rash, tenderness or lesion on the right labia. There is no rash, tenderness or lesion on the left labia. Cervix exhibits no motion tenderness, no discharge and no friability. Right adnexum displays no mass, no tenderness and no fullness. Left adnexum displays no mass, no tenderness and no fullness.  Lymphadenopathy:    She has no cervical adenopathy.  Neurological: She is alert and oriented to person, place, and time. No cranial nerve deficit. She exhibits normal muscle tone. Coordination normal.  Skin: Skin is warm and dry. No rash noted. She is not diaphoretic. No erythema. No pallor.  Psychiatric: She has a normal mood and affect. Her behavior is normal. Judgment and thought content normal.   HEPATITIS B#2 AND INFLUENZA VACCINES ADMINISTERED IN OFFICE.    Assessment & Plan:  Routine general medical examination at a health care facility - Plan: POCT urinalysis dipstick, EKG 12-Lead, CBC  with Differential, Comprehensive metabolic panel, TSH, Hemoglobin A1c, Lipid panel, Vitamin B12, Folate, Vit D  25 hydroxy (rtn osteoporosis monitoring)  Need for prophylactic vaccination and inoculation against influenza - Plan: Flu Vaccine QUAD 36+ mos IM  Pain in joint, shoulder region, left  Breast calcifications - Plan: Ambulatory referral to Oncology  Family history of breast cancer in first degree relative - Plan: Ambulatory referral to Oncology  Need for hepatitis B vaccination - Plan: Hepatitis B vaccine adult IM   1.  CPE: anticipatory guidance provided ---weight loss, exercise. Pap smear UTD.  Mammogram UTD. Colonoscopy UTD.  S/p Hepatitis B#2; s/p influenza vaccine.  Obtain labs. 2. Gynecological exam: pap smear UTD; mammogram UTD; pelvic and breast examinations completed. 3.  L shoulder pain/strain:  New.  Recommend scheduled NSAIDs for two weeks and the PRN; recommend home exercise program; avoid lifting with arm for two weeks.  If persists > 1 month, RTC for xrays and referral to ortho. 4.  Breast calcifications: New.  S/p mammogram 11/2012; refer to oncology due to family history of breast cancer in mother. 5.  Family history of breast in mother: refer to oncology for further risk stratification. 6.  S/p Hepatitis B#2; RTC five months for #3. 6. S/p influenza vaccine. 7. Depression with anxiety: moderately controlled; rx for Citalopram 40mg  daily provided.  Meds ordered this encounter  Medications  . DISCONTD: citalopram (CELEXA) 20 MG tablet    Sig: Take 20 mg by mouth daily.  . citalopram (CELEXA) 40 MG tablet    Sig: Take 1 tablet (40 mg total) by mouth daily.    Dispense:  30 tablet    Refill:  11

## 2013-01-06 LAB — TSH: TSH: 2.199 u[IU]/mL (ref 0.350–4.500)

## 2013-01-06 LAB — VITAMIN D 25 HYDROXY (VIT D DEFICIENCY, FRACTURES): Vit D, 25-Hydroxy: 32 ng/mL (ref 30–89)

## 2013-01-06 LAB — FOLATE: Folate: 19.9 ng/mL

## 2013-01-06 LAB — VITAMIN B12: Vitamin B-12: 495 pg/mL (ref 211–911)

## 2013-01-10 ENCOUNTER — Telehealth: Payer: Self-pay | Admitting: *Deleted

## 2013-01-10 NOTE — Telephone Encounter (Signed)
Left message for pt to return my call so I can schedule a genetic appt.  

## 2013-01-17 ENCOUNTER — Telehealth: Payer: Self-pay | Admitting: *Deleted

## 2013-01-17 NOTE — Telephone Encounter (Signed)
Left message for pt to return my call so I can schedule a genetic appt.  

## 2013-01-24 ENCOUNTER — Telehealth: Payer: Self-pay | Admitting: *Deleted

## 2013-01-24 NOTE — Telephone Encounter (Signed)
Called pt on her cell and could not leave a message.  Left a message at home requesting for her to call me so I can schedule a genetic appt.  Emailed Kristi at referring to make her aware that I cannot get in touch w/ pt.

## 2013-01-24 NOTE — Telephone Encounter (Signed)
Pt returned my call & I confirmed 03/03/13 genetic appt w/ her.  Emailed Kristi at referring to make her aware.

## 2013-02-07 ENCOUNTER — Encounter: Payer: Self-pay | Admitting: Family Medicine

## 2013-03-03 ENCOUNTER — Encounter (INDEPENDENT_AMBULATORY_CARE_PROVIDER_SITE_OTHER): Payer: Self-pay

## 2013-03-03 ENCOUNTER — Encounter: Payer: Self-pay | Admitting: Genetic Counselor

## 2013-03-03 ENCOUNTER — Ambulatory Visit (HOSPITAL_BASED_OUTPATIENT_CLINIC_OR_DEPARTMENT_OTHER): Payer: BC Managed Care – PPO | Admitting: Genetic Counselor

## 2013-03-03 ENCOUNTER — Other Ambulatory Visit: Payer: BC Managed Care – PPO | Admitting: Lab

## 2013-03-03 DIAGNOSIS — Z803 Family history of malignant neoplasm of breast: Secondary | ICD-10-CM

## 2013-03-03 DIAGNOSIS — Z86010 Personal history of colonic polyps: Secondary | ICD-10-CM

## 2013-03-03 DIAGNOSIS — IMO0002 Reserved for concepts with insufficient information to code with codable children: Secondary | ICD-10-CM

## 2013-03-03 NOTE — Progress Notes (Signed)
Dr.  Nilda Simmer requested a consultation for genetic counseling and risk assessment for Sandra Mills, a 52 y.o. female, for discussion of her family history of breast cancer and personal history of colon polyps.  She presents to clinic today to discuss the possibility of a genetic predisposition to cancer, and to further clarify her risks, as well as her family members' risks for cancer.   HISTORY OF PRESENT ILLNESS: Sandra Mills is a 52 y.o. female with no personal history of cancer.  She had a recent mammagram that she was called back upon b/c they found calcifications.  They will see her back in 6 months, but they did not do a biopsy.  Sandra Mills had a colonoscopy that found 5 polyps.  She is on an increased schedule for colonoscopies.  Past Medical History  Diagnosis Date  . Anxiety   . Tremor, essential   . Migraine     menstrually related.  . Colon polyps 12/09/12    three polyps; Sandra Mills; repeat in 3-5 years.  . IBS (irritable bowel syndrome)     diarrhea predominant; s/p GI consult with colonoscopy    Past Surgical History  Procedure Laterality Date  . Cyst resection perineal    . Colonoscopy w/ polypectomy  12/09/12    three polyps; Hung. Repeat in 3-5 years.    History   Social History  . Marital Status: Married    Spouse Name: N/A    Number of Children: 4  . Years of Education: N/A   Occupational History  . TEACHER    Social History Main Topics  . Smoking status: Never Smoker   . Smokeless tobacco: Not on file  . Alcohol Use: No  . Drug Use: No  . Sexual Activity: Yes   Other Topics Concern  . Not on file   Social History Narrative   Married x 28 years; happily married; no abuse.   Lives with husband, 4 children (21, 65, 34, 61).  No grandchildren.   4 children; no grandchildren.   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  Previously worked [employer].   No tobacco/alcohol/drugs   Exercise sporadic; curves and [employer].   Marland Kitchen      REPRODUCTIVE HISTORY  AND PERSONAL RISK ASSESSMENT FACTORS: Menarche was at age 75.   perimenopausal Uterus Intact: yes Ovaries Intact: yes [id number], first live birth at age 76  She has not previously undergone treatment for infertility.   Oral Contraceptive use: 10 years   She has not used HRT in the past.    FAMILY HISTORY:  We obtained a detailed, 4-generation family history.  Significant diagnoses are listed below: Family History  Problem Relation Age of Onset  . Breast cancer Mother 70  . Atrial fibrillation Father   . Atrial fibrillation Sister   . Breast cancer Other     maternal great aunt dx in her 60s  . Breast cancer Other     6 maternal great aunts dx in their 38s  The patient's mother was diagnosed with breast cancer at age 57 and died at 62.  Her maternal grandmother never had breast cancer but her grandmother had seven sisters who all had breast cancer around age 51.  There was one brother who had cancer NOS and died soon after his diagnosis.  Patient's maternal ancestors are of [place] descent, and paternal ancestors are of [place] descent. There is no reported Ashkenazi Jewish ancestry. There is no known consanguinity.  GENETIC COUNSELING  ASSESSMENT: Sandra Mills is a 52 y.o. female with a family history of breast cancer in her mother and seven great aunts which somewhat suggestive of a hereditary cancer syndrome and predisposition to cancer. We, therefore, discussed and recommended the following at today's visit.   DISCUSSION: We reviewed the characteristics, features and inheritance patterns of hereditary cancer syndromes. We also discussed genetic testing, including the appropriate family members to test, the process of testing, insurance coverage and turn-around-time for results. We discussed that the one aunt who is living is the best person to test.  That aunt lives in [place], so she will see if she would be willing to come in for testing.  PLAN: After  considering the risks, benefits, and limitations, Sandra Mills provided informed consent to pursue genetic testing and the blood sample will be sent to [facility] for analysis of the Breast/Ovarian cancer panel. We discussed the implications of a positive, negative and/ or variant of uncertain significance genetic test result. Results should be available within approximately 3 weeks' time, at which point they will be disclosed by telephone to Sandra Mills, as will any additional recommendations warranted by these results. Sandra Mills will receive a summary of her genetic counseling visit and a copy of her results once available. This information will also be available in Epic. We encouraged Sandra Mills to remain in contact with cancer genetics annually so that we can continuously update the family history and inform her of any changes in cancer genetics and testing that may be of benefit for her family. Sandra Mills's questions were answered to her satisfaction today. Our contact information was provided should additional questions or concerns arise.  The patient was seen for a total of 60 minutes, greater than 50% of which was spent face-to-face counseling.  This note will also be sent to the referring provider via the electronic medical record. The patient will be supplied with a summary of this genetic counseling discussion as well as educational information on the discussed hereditary cancer syndromes following the conclusion of their visit.   Patient was discussed with Dr. Drue Second.   _______________________________________________________________________ For Office Staff:  Number of people involved in session: 1 Was an Intern/ student involved with case: yes

## 2013-03-16 ENCOUNTER — Encounter: Payer: Self-pay | Admitting: Genetic Counselor

## 2013-03-16 ENCOUNTER — Telehealth: Payer: Self-pay | Admitting: Genetic Counselor

## 2013-03-16 NOTE — Telephone Encounter (Signed)
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

## 2013-03-16 NOTE — Telephone Encounter (Signed)
I was unable to leave a message on either her home phone or mobile number.  The VM had not been set up on the mobile number and the  Home phone VM was full.  I am hesitant to call work with genetic test results, but will if I am not able to get ahold of patient this afternoon.

## 2013-07-11 ENCOUNTER — Ambulatory Visit (INDEPENDENT_AMBULATORY_CARE_PROVIDER_SITE_OTHER): Payer: BC Managed Care – PPO | Admitting: Family Medicine

## 2013-07-11 VITALS — BP 110/70 | HR 83 | Temp 98.2°F | Resp 16 | Ht 63.5 in | Wt 186.0 lb

## 2013-07-11 DIAGNOSIS — F411 Generalized anxiety disorder: Secondary | ICD-10-CM

## 2013-07-11 MED ORDER — ESCITALOPRAM OXALATE 20 MG PO TABS: ORAL_TABLET | ORAL | Status: DC

## 2013-07-11 NOTE — Progress Notes (Signed)
This chart was scribed for Sandra SidleKurt Lauenstein, MD by Sandra Mills, ED Scribe. This patient was seen in room 8 and the patient's care was started at 5:54 PM.  Patient ID: Sandra Mills MRN: [id number], DOB: 12/23/1960, 53 y.o. Date of Encounter: 07/11/2013, 5:47 PM  Primary Physician: Sandra FreudYvonne Lowne, DO  Chief Complaint:  Chief Complaint  Patient presents with   Medication Problem    Celexa not working     HPI: 53 y.o. 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 Pt history is below, she [facility] with medication problems. Pt states that her current medication for her anxiety is not working. She states that she was previously on Lexapro and she says that she was doing better on that. Sandra Mills is requesting for a higher dosage of medication in order to control her anxiety. She denies having any recent panic attacks, says that she feels real anxious during the day like "she has butterflies". Pt states that she has scheduled an appointment to speak with her psychiatrist. Other than the symptoms stated above, pt is generally healthy. She states that she recently lost 20 lbs sing August 2014.    Past Medical History  Diagnosis Date   Anxiety    Tremor, essential    Migraine     menstrually related.   Colon polyps 12/09/12    three polyps; Elnoria HowardHung; repeat in 3-5 years.   IBS (irritable bowel syndrome)     diarrhea predominant; s/p GI consult with colonoscopy     Home Meds: Prior to Admission medications   Medication Sig Start Date End Date Taking? Authorizing Provider  citalopram (CELEXA) 40 MG tablet Take 1 tablet (40 mg total) by mouth daily. 01/05/13  Yes Sandra ChickKristi M Smith, MD    Allergies: No Known Allergies  History   Social History   Marital Status: Married    Spouse Name: N/A    Number of Children: 4   Years of Education: N/A   Occupational History   TEACHER    Social History  Main Topics   Smoking status: Never Smoker    Smokeless tobacco: Not on file   Alcohol Use: No   Drug Use: No   Sexual Activity: Yes   Other Topics Concern   Not on file   Social History Narrative   Married x 28 years; happily married; no abuse.   Lives with husband, 4 children (21, 5319, 8317, 2815).  No grandchildren.   4 children; no grandchildren.   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  Previously worked [employer].   No tobacco/alcohol/drugs   Exercise sporadic; curves and [employer].   Marland Kitchen.       Review of Systems: anxiety Constitutional: negative for chills, fever, night sweats, weight changes, or fatigue  HEENT: negative for vision changes, hearing loss, congestion, rhinorrhea, ST, epistaxis, or sinus pressure Cardiovascular: negative for chest pain or palpitations Respiratory: negative for hemoptysis, wheezing, shortness of breath, or cough Abdominal: negative for abdominal pain, nausea, vomiting, diarrhea, or constipation Dermatological: negative for rash Neurologic: negative for headache, dizziness, or syncope All other systems reviewed and are otherwise negative with the exception to those above and in the HPI.   Physical Exam: Lungs are clear bilaterally. No wheezes. No rales. Heart sounds are normal. No murmur. No rubs. No gallops. HEENT unremarkable.  Blood pressure 110/70, pulse 83, temperature 98.2 F (36.8 C), temperature source Oral, resp.  rate 16, height 5' 3.5" (1.613 m), weight 186 lb (84.369 kg), last menstrual period 07/01/2013, SpO2 98.00%., Body mass index is 32.43 kg/(m^2). General: Well developed, well nourished, in no acute distress. Head: Normocephalic, atraumatic, eyes without discharge, sclera non-icteric, nares are without discharge. Bilateral auditory canals clear, TM's are without perforation, pearly grey and translucent with reflective cone of light bilaterally. Oral cavity moist, posterior pharynx without exudate, erythema, peritonsillar abscess, or post  nasal drip.  Neck: Supple. No thyromegaly. Full ROM. No lymphadenopathy. Lungs: Clear bilaterally to auscultation without wheezes, rales, or rhonchi. Breathing is unlabored. Heart: RRR with S1 S2. No murmurs, rubs, or gallops appreciated. Abdomen: Soft, non-tender, non-distended with normoactive bowel sounds. No hepatomegaly. No rebound/guarding. No obvious abdominal masses. Msk:  Strength and tone normal for age. Extremities/Skin: Warm and dry. No clubbing or cyanosis. No edema. No rashes or suspicious lesions. Neuro: Alert and oriented X 3. Moves all extremities spontaneously. Gait is normal. CNII-XII grossly in tact. Psych:  Responds to questions appropriately with a normal affect.   Labs:   ASSESSMENT AND PLAN:  53 y.o. year old female with anxiety Lexapro 20 mg qd. Follow up with therapist    Signed, Sandra Sidle, MD 07/11/2013 5:47 PM

## 2013-07-12 ENCOUNTER — Other Ambulatory Visit: Payer: Self-pay | Admitting: Family Medicine

## 2013-07-12 DIAGNOSIS — R921 Mammographic calcification found on diagnostic imaging of breast: Secondary | ICD-10-CM

## 2013-08-01 ENCOUNTER — Ambulatory Visit
Admission: RE | Admit: 2013-08-01 | Discharge: 2013-08-01 | Disposition: A | Discharge status: Home / Self Care | Payer: Self-pay | Source: Ambulatory Visit | Attending: Family Medicine | Admitting: Family Medicine

## 2013-08-01 DIAGNOSIS — R921 Mammographic calcification found on diagnostic imaging of breast: Secondary | ICD-10-CM

## 2013-08-08 ENCOUNTER — Other Ambulatory Visit: Payer: Self-pay | Admitting: Family Medicine

## 2013-08-08 ENCOUNTER — Telehealth: Payer: Self-pay

## 2013-08-08 MED ORDER — ESCITALOPRAM OXALATE 20 MG PO TABS: 40.0000 mg | ORAL_TABLET | Freq: Every day | ORAL | Status: DC

## 2013-08-08 NOTE — Telephone Encounter (Signed)
Pt states provider told her she could double her generic lexapro  If needed,she has done that and now will need to refill med early   Best phone [phone]   [facility]

## 2014-07-18 ENCOUNTER — Other Ambulatory Visit: Payer: Self-pay | Admitting: Family Medicine

## 2014-08-20 ENCOUNTER — Other Ambulatory Visit: Payer: Self-pay | Admitting: Family Medicine

## 2014-09-06 ENCOUNTER — Other Ambulatory Visit: Payer: Self-pay | Admitting: Family Medicine

## 2014-09-26 ENCOUNTER — Telehealth: Payer: Self-pay

## 2014-09-26 NOTE — Telephone Encounter (Signed)
Advise on refill. 

## 2014-09-26 NOTE — Telephone Encounter (Signed)
Pt would like a refill on her escitalopram (LEXAPRO) 20 MG tablet [[id number]][[id number]] . I let her know that she has had a 2nd notice of no more refills without an office visit. 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 Please advise at [phone]

## 2014-09-28 MED ORDER — ESCITALOPRAM OXALATE 20 MG PO TABS: 20.0000 mg | ORAL_TABLET | Freq: Every day | ORAL | Status: DC

## 2014-09-28 NOTE — Telephone Encounter (Signed)
When does she think she'll be able to come in?  It has been >12 months since we've seen her. I am happy to refill it ONE MORE TIME, but she's going to need to make the time to come in to be seen.  Meds ordered this encounter  Medications  . escitalopram (LEXAPRO) 20 MG tablet    Sig: Take 1 tablet (20 mg total) by mouth daily.    Dispense:  30 tablet    Refill:  0    Need office visit for additional refills. 3rd/FINAL notice    Order Specific Question:  Supervising Provider    Answer:  Ellamae SiaOLITTLE, ROBERT P [[id number]]

## 2014-09-28 NOTE — Telephone Encounter (Signed)
Called pt, unable to leave message.

## 2014-09-29 NOTE — Telephone Encounter (Signed)
Called pt again, no voicemail.

## 2014-10-16 ENCOUNTER — Ambulatory Visit (INDEPENDENT_AMBULATORY_CARE_PROVIDER_SITE_OTHER): Payer: BC Managed Care – PPO | Admitting: Internal Medicine

## 2014-10-16 VITALS — BP 124/70 | HR 87 | Temp 98.5°F | Resp 16 | Ht 63.5 in | Wt 203.0 lb

## 2014-10-16 DIAGNOSIS — Z7189 Other specified counseling: Secondary | ICD-10-CM | POA: Diagnosis not present

## 2014-10-16 DIAGNOSIS — F39 Unspecified mood [affective] disorder: Secondary | ICD-10-CM | POA: Diagnosis not present

## 2014-10-16 MED ORDER — ESCITALOPRAM OXALATE 20 MG PO TABS: ORAL_TABLET | ORAL | Status: DC

## 2014-10-16 NOTE — Progress Notes (Signed)
   Subjective:    Patient ID: Sandra Mills, female    DOB: 25-Aug-1960, 54 y.o.   MRN: [id number]  HPI 54 year old female here at [facility] for refill on Lexapro. Taking 2 tablets of the 20 mg  Would like a rx for Lexapro 40 mg.  Last check up with [name] last 2014 No side effects Feeling much better Taking for a few years Review of Systems     Objective:   Physical Exam  Constitutional: She is oriented to person, place, and time. She appears well-developed and well-nourished. No distress.  HENT:  Head: Normocephalic.  Mouth/Throat: Oropharynx is clear and moist.  Eyes: EOM are normal. No scleral icterus.  Neck: Normal range of motion.  Cardiovascular: Normal rate.   Pulmonary/Chest: Effort normal.  Neurological: She is alert and oriented to person, place, and time. She exhibits normal muscle tone. Coordination normal.  Psychiatric: She has a normal mood and affect. Her behavior is normal. Thought content normal.          Assessment & Plan:  Agrees to check up for this summer RF given

## 2014-10-16 NOTE — Patient Instructions (Signed)
Escitalopram tablets  What is this medicine?  ESCITALOPRAM (es sye TAL oh pram) is used to treat depression and certain types of anxiety.  This medicine may be used for other purposes; ask your health care provider or pharmacist if you have questions.  COMMON BRAND NAME(S): Lexapro  What should I tell my health care provider before I take this medicine?  They need to know if you have any of these conditions:  -bipolar disorder or a family history of bipolar disorder  -diabetes  -glaucoma  -heart disease  -kidney or liver disease  -receiving electroconvulsive therapy  -seizures (convulsions)  -suicidal thoughts, plans, or attempt by you or a family member  -an unusual or allergic reaction to escitalopram, the related drug citalopram, other medicines, foods, dyes, or preservatives  -pregnant or trying to become pregnant  -breast-feeding  How should I use this medicine?  Take this medicine by mouth with a glass of water. Follow the directions on the prescription label. You can take it with or without food. If it upsets your stomach, take it with food. Take your medicine at regular intervals. Do not take it more often than directed. Do not stop taking this medicine suddenly except upon the advice of your doctor. Stopping this medicine too quickly may cause serious side effects or your condition may worsen.  A special MedGuide will be given to you by the pharmacist with each prescription and refill. Be sure to read this information carefully each time.  Talk to your pediatrician regarding the use of this medicine in children. Special care may be needed.  Overdosage: If you think you have taken too much of this medicine contact a poison control center or emergency room at once.  NOTE: This medicine is only for you. Do not share this medicine with others.  What if I miss a dose?  If you miss a dose, take it as soon as you can. If it is almost time for your next dose, take only that dose. Do not take double or extra  doses.  What may interact with this medicine?  Do not take this medicine with any of the following medications:  -certain medicines for fungal infections like fluconazole, itraconazole, ketoconazole, posaconazole, voriconazole  -cisapride  -citalopram  -dofetilide  -dronedarone  -linezolid  -MAOIs like Carbex, Eldepryl, Marplan, Nardil, and Parnate  -methylene blue (injected into a vein)  -pimozide  -thioridazine  -ziprasidone  This medicine may also interact with the following medications:  -alcohol  -aspirin and aspirin-like medicines  -carbamazepine  -certain medicines for depression, anxiety, or psychotic disturbances  -certain medicines for migraine headache like almotriptan, eletriptan, frovatriptan, naratriptan, rizatriptan, sumatriptan, zolmitriptan  -certain medicines for sleep  -certain medicines that treat or prevent blood clots like warfarin, enoxaparin, dalteparin  -cimetidine  -diuretics  -fentanyl  -furazolidone  -isoniazid  -lithium  -metoprolol  -NSAIDs, medicines for pain and inflammation, like ibuprofen or naproxen  -other medicines that prolong the QT interval (cause an abnormal heart rhythm)  -procarbazine  -rasagiline  -supplements like St. John's wort, kava kava, valerian  -tramadol  -tryptophan  This list may not describe all possible interactions. Give your health care provider a list of all the medicines, herbs, non-prescription drugs, or dietary supplements you use. Also tell them if you smoke, drink alcohol, or use illegal drugs. Some items may interact with your medicine.  What should I watch for while using this medicine?  Tell your doctor if your symptoms do not get better or if they   get worse. Visit your doctor or health care professional for regular checks on your progress. Because it may take several weeks to see the full effects of this medicine, it is important to continue your treatment as prescribed by your doctor.  Patients and their families should watch out for new or  worsening thoughts of suicide or depression. Also watch out for sudden changes in feelings such as feeling anxious, agitated, panicky, irritable, hostile, aggressive, impulsive, severely restless, overly excited and hyperactive, or not being able to sleep. If this happens, especially at the beginning of treatment or after a change in dose, call your health care professional.  You may get drowsy or dizzy. Do not drive, use machinery, or do anything that needs mental alertness until you know how this medicine affects you. Do not stand or sit up quickly, especially if you are an older patient. This reduces the risk of dizzy or fainting spells. Alcohol may interfere with the effect of this medicine. Avoid alcoholic drinks.  Your mouth may get dry. Chewing sugarless gum or sucking hard candy, and drinking plenty of water may help. Contact your doctor if the problem does not go away or is severe.  What side effects may I notice from receiving this medicine?  Side effects that you should report to your doctor or health care professional as soon as possible:  -allergic reactions like skin rash, itching or hives, swelling of the face, lips, or tongue  -confusion  -feeling faint or lightheaded, falls  -fast talking and excited feelings or actions that are out of control  -hallucination, loss of contact with reality  -seizures  -suicidal thoughts or other mood changes  -unusual bleeding or bruising  Side effects that usually do not require medical attention (report to your doctor or health care professional if they continue or are bothersome):  -blurred vision  -changes in appetite  -change in sex drive or performance  -headache  -increased sweating  -nausea  This list may not describe all possible side effects. Call your doctor for medical advice about side effects. You may report side effects to FDA at 1-800-FDA-1088.  Where should I keep my medicine?  Keep out of reach of children.  Store at room temperature between 15 and  30 degrees C (59 and 86 degrees F). Throw away any unused medicine after the expiration date.  NOTE: This sheet is a summary. It may not cover all possible information. If you have questions about this medicine, talk to your doctor, pharmacist, or health care provider.  © 2015, Elsevier/[employer] Standard. (2012-11-09 12:32:55)

## 2014-10-16 NOTE — Progress Notes (Signed)
   Subjective:    Patient ID: Sandra Mills, female    DOB: 1960/11/28, 54 y.o.   MRN: [id number]  HPI    Review of Systems     Objective:   Physical Exam        Assessment & Plan:  Mood disorder Med rev and refill

## 2015-01-08 ENCOUNTER — Encounter: Payer: Self-pay | Admitting: Family Medicine

## 2015-01-08 ENCOUNTER — Ambulatory Visit (INDEPENDENT_AMBULATORY_CARE_PROVIDER_SITE_OTHER): Payer: BC Managed Care – PPO | Admitting: Family Medicine

## 2015-01-08 VITALS — BP 120/79 | HR 116 | Temp 98.2°F | Resp 16 | Ht 63.25 in | Wt 189.0 lb

## 2015-01-08 DIAGNOSIS — G43009 Migraine without aura, not intractable, without status migrainosus: Secondary | ICD-10-CM

## 2015-01-08 DIAGNOSIS — Z1329 Encounter for screening for other suspected endocrine disorder: Secondary | ICD-10-CM | POA: Diagnosis not present

## 2015-01-08 DIAGNOSIS — Z13220 Encounter for screening for lipoid disorders: Secondary | ICD-10-CM

## 2015-01-08 DIAGNOSIS — E669 Obesity, unspecified: Secondary | ICD-10-CM | POA: Diagnosis not present

## 2015-01-08 DIAGNOSIS — Z114 Encounter for screening for human immunodeficiency virus [HIV]: Secondary | ICD-10-CM

## 2015-01-08 DIAGNOSIS — Z6833 Body mass index (BMI) 33.0-33.9, adult: Secondary | ICD-10-CM | POA: Diagnosis not present

## 2015-01-08 DIAGNOSIS — Z131 Encounter for screening for diabetes mellitus: Secondary | ICD-10-CM | POA: Diagnosis not present

## 2015-01-08 DIAGNOSIS — Z7189 Other specified counseling: Secondary | ICD-10-CM | POA: Diagnosis not present

## 2015-01-08 DIAGNOSIS — F39 Unspecified mood [affective] disorder: Secondary | ICD-10-CM | POA: Diagnosis not present

## 2015-01-08 DIAGNOSIS — Z0000 Encounter for general adult medical examination without abnormal findings: Secondary | ICD-10-CM

## 2015-01-08 LAB — CBC WITH DIFFERENTIAL/PLATELET
BASOS PCT: 0 % (ref 0–1)
Basophils Absolute: 0 10*3/uL (ref 0.0–0.1)
EOS ABS: 0.2 10*3/uL (ref 0.0–0.7)
Eosinophils Relative: 2 % (ref 0–5)
HCT: 36.5 % (ref 36.0–46.0)
HEMOGLOBIN: 12.3 g/dL (ref 12.0–15.0)
Lymphocytes Relative: 30 % (ref 12–46)
Lymphs Abs: 2.3 10*3/uL (ref 0.7–4.0)
MCH: 29.1 pg (ref 26.0–34.0)
MCHC: 33.7 g/dL (ref 30.0–36.0)
MCV: 86.5 fL (ref 78.0–100.0)
MONO ABS: 0.5 10*3/uL (ref 0.1–1.0)
MPV: 9.9 fL (ref 8.6–12.4)
Monocytes Relative: 6 % (ref 3–12)
NEUTROS ABS: 4.8 10*3/uL (ref 1.7–7.7)
Neutrophils Relative %: 62 % (ref 43–77)
Platelets: 340 10*3/uL (ref 150–400)
RBC: 4.22 MIL/uL (ref 3.87–5.11)
RDW: 15.9 % — ABNORMAL HIGH (ref 11.5–15.5)
WBC: 7.8 10*3/uL (ref 4.0–10.5)

## 2015-01-08 LAB — POCT URINALYSIS DIPSTICK
BILIRUBIN UA: NEGATIVE
Glucose, UA: NEGATIVE
Ketones, UA: NEGATIVE
Nitrite, UA: NEGATIVE
PH UA: 5.5
PROTEIN UA: NEGATIVE
Urobilinogen, UA: 0.2

## 2015-01-08 LAB — COMPREHENSIVE METABOLIC PANEL
ALBUMIN: 4 g/dL (ref 3.6–5.1)
ALT: 23 U/L (ref 6–29)
AST: 19 U/L (ref 10–35)
Alkaline Phosphatase: 67 U/L (ref 33–130)
BUN: 13 mg/dL (ref 7–25)
CHLORIDE: 101 mmol/L (ref 98–110)
CO2: 24 mmol/L (ref 20–31)
Calcium: 9.7 mg/dL (ref 8.6–10.4)
Creat: 0.73 mg/dL (ref 0.50–1.05)
Glucose, Bld: 98 mg/dL (ref 65–99)
POTASSIUM: 4 mmol/L (ref 3.5–5.3)
SODIUM: 138 mmol/L (ref 135–146)
TOTAL PROTEIN: 6.7 g/dL (ref 6.1–8.1)
Total Bilirubin: 0.5 mg/dL (ref 0.2–1.2)

## 2015-01-08 LAB — LIPID PANEL
CHOL/HDL RATIO: 3.9 ratio (ref <=5.0)
CHOLESTEROL: 167 mg/dL (ref 125–200)
HDL: 43 mg/dL — AB (ref >=46)
LDL Cholesterol: 91 mg/dL (ref <130)
TRIGLYCERIDES: 166 mg/dL — AB (ref <150)
VLDL: 33 mg/dL — ABNORMAL HIGH (ref <30)

## 2015-01-08 LAB — TSH: TSH: 1.902 u[IU]/mL (ref 0.350–4.500)

## 2015-01-08 MED ORDER — ESCITALOPRAM OXALATE 20 MG PO TABS: ORAL_TABLET | ORAL | Status: DC

## 2015-01-08 MED ORDER — RIZATRIPTAN BENZOATE 10 MG PO TBDP: 10.0000 mg | ORAL_TABLET | ORAL | Status: DC | PRN

## 2015-01-08 NOTE — Patient Instructions (Signed)
1. SCHEDULE MAMMOGRAM PLEASE!  Keeping You Healthy  Get These Tests  Blood Pressure- Have your blood pressure checked by your healthcare provider at least once a year.  Normal blood pressure is 120/80.  Weight- Have your body mass index (BMI) calculated to screen for obesity.  BMI is a measure of body fat based on height and weight.  You can calculate your own BMI at https://www.west-esparza.com/  Cholesterol- Have your cholesterol checked every year.  Diabetes- Have your blood sugar checked every year if you have high blood pressure, high cholesterol, a family history of diabetes or if you are overweight.  Pap Test - Have a pap test every 1 to 5 years if you have been sexually active.  If you are older than 65 and recent pap tests have been normal you may not need additional pap tests.  In addition, if you have had a hysterectomy  for benign disease additional pap tests are not necessary.  Mammogram-Yearly mammograms are essential for early detection of breast cancer  Screening for Colon Cancer- Colonoscopy starting at age 38. Screening may begin sooner depending on your family history and other health conditions.  Follow up colonoscopy as directed by your Gastroenterologist.  Screening for Osteoporosis- Screening begins at age 67 with bone density scanning, sooner if you are at higher risk for developing Osteoporosis.  Get these medicines  Calcium with Vitamin D- Your body requires 1200-1500 mg of Calcium a day and [phone] IU of Vitamin D a day.  You can only absorb 500 mg of Calcium at a time therefore Calcium must be taken in 2 or 3 separate doses throughout the day.  Hormones- Hormone therapy has been associated with increased risk for certain cancers and heart disease.  Talk to your healthcare provider about if you need relief from menopausal symptoms.  Aspirin- Ask your healthcare provider about taking Aspirin to prevent Heart Disease and Stroke.  Get these Immuniztions  Flu shot-  Every fall  Pneumonia shot- Once after the age of 35; if you are younger ask your healthcare provider if you need a pneumonia shot.  Tetanus- Every ten years.  Zostavax- Once after the age of 41 to prevent shingles.  Take these steps  Don't smoke- Your healthcare provider can help you quit. For tips on how to quit, ask your healthcare provider or go to www.smokefree.gov or call 1-800 QUIT-NOW.  Be physically active- Exercise 5 days a week for a minimum of 30 minutes.  If you are not already physically active, start slow and gradually work up to 30 minutes of moderate physical activity.  Try walking, dancing, bike riding, swimming, etc.  Eat a healthy diet- Eat a variety of healthy foods such as fruits, vegetables, whole grains, low fat milk, low fat cheeses, yogurt, lean meats, chicken, fish, eggs, dried beans, tofu, etc.  For more information go to www.thenutritionsource.org  Dental visit- Brush and floss teeth twice daily; visit your dentist twice a year.  Eye exam- Visit your Optometrist or Ophthalmologist yearly.  Drink alcohol in moderation- Limit alcohol intake to one drink or less a day.  Never drink and drive.  Depression- Your emotional health is as important as your physical health.  If you're feeling down or losing interest in things you normally enjoy, please talk to your healthcare provider.  Seat Belts- can save your life; always wear one  Smoke/Carbon Monoxide detectors- These detectors need to be installed on the appropriate level of your home.  Replace batteries at least once  a year.  Violence- If anyone is threatening or hurting you, please tell your healthcare provider.  Living Will/ Health care power of attorney- Discuss with your healthcare provider and family.

## 2015-01-08 NOTE — Progress Notes (Signed)
Subjective:    Patient ID: Sandra Mills, female    DOB: Mar 31, 1961, 54 y.o.   MRN: [id number]  01/08/2015  Annual Exam   HPI This 54 y.o. female presents for Complete Physical Examination.  Last physical:  01-05-2013 Pap smear: 12-10-2011 WNL; HPV negative. Currently on menses; regular; bleeding 4 days, heavy, cramping.  Migraines with headaches.   Vasectomy. Mammogram:  08-11-2013 benign calcifications.  Received letter.  S/p genetics consultation in 2014; no BRCA genes. Colonoscopy:  12-09-2012 polyps; repeat 5 years. TDAP:  2013 Influenza:  At school this year Eye exam:  Just completed 2016; +freckle on retina; s/p evaluation by retina specialist; follow-up. Dental exam:  Just completed.  Anxiety and depression: Patient reports good compliance with medication, good tolerance to medication, and good symptom control.  Taking Lexapro one daily; may take a second one if needed.  Not seeing psychiatrist or therapist.  [facility] has been refilling Lexapro.  Known GAD.    Migraines:  +photophobia; +nausea; +vomiting some.  Taking Tylenol and Pepsi.  Goes to bed.  Vasectomy. Tried Imitrex in past without improvement.  Tried a student's Maxalt MLT with good results while in [place] this summer.  Leg swelling B with travel: traveled to [place] this summer; after trip to [place], B legs swelled significantly and suffered with a blood red rash B lower extremities.  Has suffered with moderate swelling with travel and prolonged walking in the past on several occasions.  Would like to help prevent swelling.   Review of Systems  Constitutional: Negative for fever, chills, diaphoresis, activity change, appetite change, fatigue and unexpected weight change.  HENT: Negative for congestion, dental problem, drooling, ear discharge, ear pain, facial swelling, hearing loss, mouth sores, nosebleeds, postnasal drip, rhinorrhea, sinus pressure, sneezing, sore throat, tinnitus, trouble swallowing and voice change.     Eyes: Positive for visual disturbance. Negative for photophobia, pain, discharge, redness and itching.  Respiratory: Negative for apnea, cough, choking, chest tightness, shortness of breath, wheezing and stridor.   Cardiovascular: Positive for leg swelling. Negative for chest pain and palpitations.  Gastrointestinal: Positive for diarrhea. Negative for nausea, vomiting, abdominal pain, constipation, blood in stool, abdominal distention, anal bleeding and rectal pain.  Endocrine: Negative for cold intolerance, heat intolerance, polydipsia, polyphagia and polyuria.  Genitourinary: Negative for dysuria, urgency, frequency, hematuria, flank pain, decreased urine volume, vaginal bleeding, vaginal discharge, enuresis, difficulty urinating, genital sores, vaginal pain, menstrual problem, pelvic pain and dyspareunia.  Musculoskeletal: Negative for myalgias, back pain, joint swelling, arthralgias, gait problem, neck pain and neck stiffness.  Skin: Negative for color change, pallor, rash and wound.  Allergic/Immunologic: Negative for environmental allergies, food allergies and immunocompromised state.  Neurological: Positive for headaches. Negative for dizziness, tremors, seizures, syncope, facial asymmetry, speech difficulty, weakness, light-headedness and numbness.  Hematological: Negative for adenopathy. Does not bruise/bleed easily.  Psychiatric/Behavioral: Negative for suicidal ideas, hallucinations, behavioral problems, confusion, sleep disturbance, self-injury, dysphoric mood, decreased concentration and agitation. The patient is not nervous/anxious and is not hyperactive.     Past Medical History  Diagnosis Date  . Anxiety   . Tremor, essential   . Migraine     menstrually related.  . Colon polyps 12/09/12    three polyps; [facility] [place]; repeat in 3-5 years.  . IBS (irritable bowel syndrome)     diarrhea predominant; s/p GI consult with colonoscopy   Past Surgical History  Procedure Laterality Date   . Cyst resection perineal    . Colonoscopy w/ polypectomy  12/09/12  three polyps; [place]. Repeat in 3-5 years.   Not on File Current Outpatient Prescriptions  Medication Sig Dispense Refill  . escitalopram (LEXAPRO) 20 MG tablet Take two po daily 180 tablet 3  . rizatriptan (MAXALT-MLT) 10 MG disintegrating tablet Take 1 tablet (10 mg total) by mouth as needed for migraine. May repeat in 2 hours if needed 8 tablet 11   No current facility-administered medications for this visit.   Social History   Social History  . Marital Status: Married    Spouse Name: N/A  . Number of Children: 4  . Years of Education: N/A   Occupational History  . TEACHER    Social History Main Topics  . Smoking status: Never Smoker   . Smokeless tobacco: Not on file  . Alcohol Use: No  . Drug Use: No  . Sexual Activity: Yes   Other Topics Concern  . Not on file   Social History Narrative   Marital status:  Married x 30 years; happily married; no abuse.      Lives: Lives with husband, 3/4 children (96 43, 34, 5).  No grandchildren.      Children: 4 children; no grandchildren.      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  Previously worked [employer].   No tobacco/alcohol/drugs; wine three times per year.   Exercise sporadic; curves and [employer].   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     Family History  Problem Relation Age of Onset  . Breast cancer Mother 44  . Cancer Mother 78    Breast cancer age 57  . Atrial fibrillation Father   . Atrial fibrillation Sister   . Breast cancer Other     maternal great aunt dx in her 34s  . Breast cancer Other     6 maternal great aunts dx in their 38s       Objective:    BP 120/79 mmHg  Pulse 116  Temp(Src) 98.2 F (36.8 C) (Oral)  Resp 16  Ht 5' 3.25" (1.607 m)  Wt 189 lb (85.73 kg)  BMI 33.20 kg/m2  SpO2 98%  LMP 01/08/2015 Physical Exam  Constitutional: She is oriented to person, place, and time. She appears well-developed  and well-nourished. No distress.  obese  HENT:  Head: Normocephalic and atraumatic.  Right Ear: External ear normal.  Left Ear: External ear normal.  Nose: Nose normal.  Mouth/Throat: Oropharynx is clear and moist.  Eyes: Conjunctivae and EOM are normal. Pupils are equal, round, and reactive to light.  Neck: Normal range of motion and full passive range of motion without pain. Neck supple. No JVD present. Carotid bruit is not present. No thyromegaly present.  Cardiovascular: Normal rate, regular rhythm and normal heart sounds.  Exam reveals no gallop and no friction rub.   No murmur heard. Pulmonary/Chest: Effort normal and breath sounds normal. She has no wheezes. She has no rales. Right breast exhibits no inverted nipple, no mass, no nipple discharge, no skin change and no tenderness. Left breast exhibits no inverted nipple, no mass, no nipple discharge, no skin change and no tenderness. Breasts are symmetrical.  Abdominal: Soft. Bowel sounds are normal. She exhibits no distension and no mass. There is no tenderness. There is no rebound and no guarding.  Musculoskeletal:       Right shoulder: Normal.       Left shoulder: Normal.       Cervical back: Normal.  Lymphadenopathy:    She has  no cervical adenopathy.  Neurological: She is alert and oriented to person, place, and time. She has normal reflexes. She displays tremor. No cranial nerve deficit. She exhibits normal muscle tone. Coordination normal.  Mild head tremor present.  Skin: Skin is warm and dry. No rash noted. She is not diaphoretic. No erythema. No pallor.  Scattered nevi along torso.  Psychiatric: She has a normal mood and affect. Her behavior is normal. Judgment and thought content normal.  Nursing note and vitals reviewed.  Results for orders placed or performed in visit on 01/05/13  CBC with Differential  Result Value Ref Range   WBC 8.9 4.0 - 10.5 K/uL   RBC 4.09 3.87 - 5.11 MIL/uL   Hemoglobin 12.4 12.0 - 15.0 g/dL     HCT 36.4 36.0 - 46.0 %   MCV 89.0 78.0 - 100.0 fL   MCH 30.3 26.0 - 34.0 pg   MCHC 34.1 30.0 - 36.0 g/dL   RDW 15.4 11.5 - 15.5 %   Platelets 372 150 - 400 K/uL   Neutrophils Relative % 62 43 - 77 %   Neutro Abs 5.4 1.7 - 7.7 K/uL   Lymphocytes Relative 31 12 - 46 %   Lymphs Abs 2.8 0.7 - 4.0 K/uL   Monocytes Relative 6 3 - 12 %   Monocytes Absolute 0.6 0.1 - 1.0 K/uL   Eosinophils Relative 1 0 - 5 %   Eosinophils Absolute 0.1 0.0 - 0.7 K/uL   Basophils Relative 0 0 - 1 %   Basophils Absolute 0.0 0.0 - 0.1 K/uL   Smear Review Criteria for review not met   Comprehensive metabolic panel  Result Value Ref Range   Sodium 136 135 - 145 mEq/L   Potassium 4.0 3.5 - 5.3 mEq/L   Chloride 101 96 - 112 mEq/L   CO2 25 19 - 32 mEq/L   Glucose, Bld 81 70 - 99 mg/dL   BUN 12 6 - 23 mg/dL   Creat 0.64 0.50 - 1.10 mg/dL   Total Bilirubin 0.5 0.3 - 1.2 mg/dL   Alkaline Phosphatase 78 39 - 117 U/L   AST 20 0 - 37 U/L   ALT 24 0 - 35 U/L   Total Protein 7.7 6.0 - 8.3 g/dL   Albumin 4.5 3.5 - 5.2 g/dL   Calcium 9.2 8.4 - 10.5 mg/dL  TSH  Result Value Ref Range   TSH 2.199 0.350 - 4.500 uIU/mL  Hemoglobin A1c  Result Value Ref Range   Hgb A1c MFr Bld 5.3 <5.7 %   Mean Plasma Glucose 105 <117 mg/dL  Lipid panel  Result Value Ref Range   Cholesterol 191 0 - 200 mg/dL   Triglycerides 217 (H) <150 mg/dL   HDL 59 >39 mg/dL   Total CHOL/HDL Ratio 3.2 Ratio   VLDL 43 (H) 0 - 40 mg/dL   LDL Cholesterol 89 0 - 99 mg/dL  Vitamin B12  Result Value Ref Range   Vitamin B-12 495 211 - 911 pg/mL  Folate  Result Value Ref Range   Folate 19.9 ng/mL  Vit D  25 hydroxy (rtn osteoporosis monitoring)  Result Value Ref Range   Vit D, 25-Hydroxy 32 30 - 89 ng/mL  POCT urinalysis dipstick  Result Value Ref Range   Color, UA yellow    Clarity, UA clear    Glucose, UA neg    Bilirubin, UA neg    Ketones, UA neg    Spec Grav, UA 1.015  Blood, UA neg    pH, UA 5.5    Protein, UA neg     Urobilinogen, UA 0.2    Nitrite, UA neg    Leukocytes, UA Trace        Assessment & Plan:   1. Routine physical examination   2. Screening for diabetes mellitus   3. Screening, lipid   4. Screening for thyroid disorder   5. Screening for HIV (human immunodeficiency virus)   6. Migraine without aura and without status migrainosus, not intractable   7. Episodic mood disorder   8. Encounter for medication review and counseling     1. Complete Physical Examination: anticipatory guidance --- exercise, weight loss, 3 servings of dairy daily. Pap smear UTD.  Pt to schedule mammogram.  Colonoscopy UTD.  Immunizations UTD. Perform pap smear in six months.   Gynecological exam: pap smear UTD; currently on menses; will perform pap smear in six months; pt to schedule mammogram.  Vasectomy for contraception. 2.  Screening DMII: obtain glucose, HgbA1c. 3. Screening lipid: obtain FLP. 4.  Screening HIV per CDC guidelines: obtain HIV. 5.  Menstrual migraines: worsening; rx for Maxalt MLT provided; also recommend increasing Lexapro to 1.5-2 tablets daily during menses. 6.  Generalized anxiety disorder: controlled; refill of Lexapro provided; RTC six months. 7. Obesity: pt participating in weight loss program; recommend exercise and low-caloric food choices.   Orders Placed This Encounter  Procedures  . CBC with Differential/Platelet  . Comprehensive metabolic panel    Order Specific Question:  Has the patient fasted?    Answer:  Yes  . Hemoglobin A1c  . Lipid panel    Order Specific Question:  Has the patient fasted?    Answer:  Yes  . TSH  . HIV antibody  . POCT urinalysis dipstick   Meds ordered this encounter  Medications  . rizatriptan (MAXALT-MLT) 10 MG disintegrating tablet    Sig: Take 1 tablet (10 mg total) by mouth as needed for migraine. May repeat in 2 hours if needed    Dispense:  8 tablet    Refill:  11  . escitalopram (LEXAPRO) 20 MG tablet    Sig: Take two po daily     Dispense:  180 tablet    Refill:  3    Return in about 6 months (around 07/08/2015) for recheck anxiety, migraines.   Becker Christopher Elayne Guerin, M.D. [facility] [address] [place],   [address] [phone] phone [phone] fax

## 2015-01-08 NOTE — Progress Notes (Signed)
   Subjective:    Patient ID: Sandra Mills, female    DOB: 1960-07-24, 54 y.o.   MRN: [id number]  HPI    Review of Systems  Constitutional: Negative.   Eyes: Positive for visual disturbance.  Respiratory: Negative.   Cardiovascular: Positive for leg swelling.  Gastrointestinal: Positive for diarrhea.  Endocrine: Negative.   Genitourinary: Negative.   Musculoskeletal: Negative.   Skin: Negative.   Allergic/Immunologic: Negative.   Neurological: Positive for headaches.  Hematological: Negative.   Psychiatric/Behavioral: Negative.        Objective:   Physical Exam        Assessment & Plan:

## 2015-01-09 ENCOUNTER — Encounter: Payer: Self-pay | Admitting: Family Medicine

## 2015-01-09 LAB — HIV ANTIBODY (ROUTINE TESTING W REFLEX): HIV 1&2 Ab, 4th Generation: NONREACTIVE

## 2015-01-09 LAB — HEMOGLOBIN A1C
HEMOGLOBIN A1C: 5.4 % (ref <5.7)
MEAN PLASMA GLUCOSE: 108 mg/dL (ref <117)

## 2015-01-17 ENCOUNTER — Telehealth: Payer: Self-pay

## 2015-01-17 NOTE — Telephone Encounter (Signed)
The following email was submitted to your website from Gabryel Talamo Can you email me my test results from my recent physical?  I am not feeling well, and I am wondering if something showed up.  The office has been trying to contact me by my cell phone, but I'm usually in class with students and unable to answer.  Thank you! Kerina  Date of birth: 1960-09-08

## 2015-01-17 NOTE — Telephone Encounter (Signed)
Called pt, unable to leave voicemail

## 2015-01-19 NOTE — Telephone Encounter (Signed)
Called unable to leave message

## 2015-02-08 ENCOUNTER — Telehealth: Payer: Self-pay | Admitting: Radiology

## 2015-02-08 NOTE — Telephone Encounter (Signed)
Noted  

## 2015-02-08 NOTE — Telephone Encounter (Signed)
Correspondence from [facility]/ Lexapro dose exceeds maximum guidelines / pharmacy wants you to consider risks versus benefits of 20 mg bid dosing

## 2015-07-09 ENCOUNTER — Ambulatory Visit: Payer: BC Managed Care – PPO | Admitting: Family Medicine

## 2015-07-24 ENCOUNTER — Ambulatory Visit: Payer: BC Managed Care – PPO | Admitting: Family Medicine

## 2015-10-11 ENCOUNTER — Encounter: Payer: Self-pay | Admitting: Physician Assistant

## 2015-10-11 ENCOUNTER — Ambulatory Visit (INDEPENDENT_AMBULATORY_CARE_PROVIDER_SITE_OTHER): Payer: BC Managed Care – PPO | Admitting: Physician Assistant

## 2015-10-11 ENCOUNTER — Telehealth: Payer: Self-pay

## 2015-10-11 VITALS — BP 116/78 | HR 92 | Temp 97.9°F | Resp 18 | Ht 63.25 in | Wt 199.0 lb

## 2015-10-11 DIAGNOSIS — F411 Generalized anxiety disorder: Secondary | ICD-10-CM | POA: Diagnosis not present

## 2015-10-11 LAB — TSH: TSH: 1.68 mIU/L

## 2015-10-11 LAB — CBC
HCT: 40.3 % (ref 35.0–45.0)
HEMOGLOBIN: 13.6 g/dL (ref 11.7–15.5)
MCH: 30.6 pg (ref 27.0–33.0)
MCHC: 33.7 g/dL (ref 32.0–36.0)
MCV: 90.6 fL (ref 80.0–100.0)
MPV: 9.2 fL (ref 7.5–12.5)
PLATELETS: 311 10*3/uL (ref 140–400)
RBC: 4.45 MIL/uL (ref 3.80–5.10)
RDW: 14.7 % (ref 11.0–15.0)
WBC: 8.2 10*3/uL (ref 3.8–10.8)

## 2015-10-11 MED ORDER — CLONAZEPAM 1 MG PO TABS: 0.5000 mg | ORAL_TABLET | Freq: Two times a day (BID) | ORAL | Status: DC | PRN

## 2015-10-11 MED ORDER — PAROXETINE HCL 20 MG PO TABS: 20.0000 mg | ORAL_TABLET | ORAL | Status: DC

## 2015-10-11 NOTE — Telephone Encounter (Signed)
Please advise. Pt also sent MyChart message to Deliah BostonMichael Clark, PA-C.

## 2015-10-11 NOTE — Patient Instructions (Addendum)
Reduce Lexapro by 10 mg every three days until finished, then start Paxil.  Okay to increase Paxil by 10 mg weekly until 50 mgs is reached.  Please take klonopin as prescribed during the transition.      IF you received an x-ray today, you will receive an invoice from [facility]. Please contact [facility] at [phone] with questions or concerns regarding your invoice.   IF you received labwork today, you will receive an invoice from [facility]. Please contact [facility] at [phone] with questions or concerns regarding your invoice.   Our billing staff will not be able to assist you with questions regarding bills from these companies.  You will be contacted with the lab results as soon as they are available. The fastest way to get your results is to activate your My Chart account. Instructions are located on the last page of this paperwork. If you have not heard from us regarding the results in 2 weeks, please contact this office.

## 2015-10-11 NOTE — Telephone Encounter (Signed)
°  Patient is concerned the medication prescribed today causes weight gain, and she is currently over weight.  Is there another medication that won't cause weight gain?  [phone]

## 2015-10-11 NOTE — Telephone Encounter (Signed)
All SSRI medications can cause weight gain. Paroxetine is particularly good at helping with anxiety and panic. We can try another SSRI however I don't think the difference between the medication she is currently taking and any other high dose SSRI will carry a significantly different risk of weight gain. The other med to consider would Sertraline, but I think paroxetine would be better for her symptoms.

## 2015-10-11 NOTE — Progress Notes (Signed)
10/11/2015 1:10 PM   DOB: 06/27/1960 / MRN: [id number]  SUBJECTIVE:  Sandra Mills is a 55 y.o. female presenting for recheck of anxiety.  She is taking Lexapro 40 mg and reports while this used to work very well to control her symptoms, it is no longer helping her.  Reports she wakes up in the morning and can feel the anxiety "in my stomach," and states that she feels that she is near panic on a daily basis. She has never taken benzodiazepine therapy.  She denies alcohol and illicit drugs.  She is amenable to psych referral if needed and counseling.  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  She would like to take this time to try another medication to aid in her anxiousness.   She has No Known Allergies.   She  has a past medical history of Anxiety; Tremor, essential; Migraine; Colon polyps (12/09/12); and IBS (irritable bowel syndrome).    She  reports that she has never smoked. She does not have any smokeless tobacco history on file. She reports that she does not drink alcohol or use illicit drugs. She  reports that she currently engages in sexual activity. She reports using the following method of birth control/protection: Surgical. The patient  has past surgical history that includes Cyst resection perineal and Colonoscopy w/ polypectomy (12/09/12).  Her family history includes Atrial fibrillation in her father and sister; Breast cancer in her other and other; Breast cancer (age of onset: 650) in her mother; Cancer (age of onset: 6450) in her mother.  Review of Systems  Constitutional: Negative for fever and chills.  Eyes: Negative for blurred vision.  Respiratory: Negative for cough and shortness of breath.   Cardiovascular: Negative for chest pain.  Gastrointestinal: Negative for nausea and abdominal pain.  Genitourinary: Negative for dysuria, urgency and frequency.  Musculoskeletal: Negative for myalgias.  Skin: Negative for rash.  Neurological: Negative for dizziness,  tingling and headaches.  Psychiatric/Behavioral: Negative for depression. The patient is not nervous/anxious.     Problem list and medications reviewed and updated by myself where necessary, and exist elsewhere in the encounter.   OBJECTIVE:  BP 116/78 mmHg  Pulse 92  Temp(Src) 97.9 F (36.6 C) (Oral)  Resp 18  Ht 5' 3.25" (1.607 m)  Wt 199 lb (90.266 kg)  BMI 34.95 kg/m2  SpO2 96%  LMP 08/13/2015  Physical Exam  Constitutional: She is oriented to person, place, and time. She appears well-nourished. No distress.  Eyes: EOM are normal. Pupils are equal, round, and reactive to light.  Cardiovascular: Normal rate.   Pulmonary/Chest: Effort normal.  Abdominal: She exhibits no distension.  Neurological: She is alert and oriented to person, place, and time. No cranial nerve deficit. Gait normal.  Skin: Skin is dry. She is not diaphoretic.  Psychiatric: Her speech is normal. Judgment normal. Her mood appears anxious. She is not hyperactive, not slowed and not withdrawn. Cognition and memory are normal. She exhibits a depressed mood. She expresses no homicidal and no suicidal ideation. She is attentive.  Vitals reviewed.   No results found for this or any previous visit (from the past 72 hour(s)).  No results found.  ASSESSMENT AND PLAN  Lupita LeashDonna was seen today for medication problem.  Diagnoses and all orders for this visit:  GAD (generalized anxiety disorder): Will try her on Paxil. Will ween her off of Lexapro per AVS and will titrate up Paxil.  If improved with hold off on  psych consult.  Will start talk therapy via referral to [facility]'s office. .  -     clonazePAM (KLONOPIN) 1 MG tablet; Take 0.5-1 tablets (0.5-1 mg total) by mouth 2 (two) times daily as needed for anxiety. -     PARoxetine (PAXIL) 20 MG tablet; Take 1 tablet (20 mg total) by mouth every morning. May increase by 10 mg weekly. Max dose 50 mg daily in the morning. -     TSH -     CBC    The patient was  advised to call or return to clinic if she does not see an improvement in symptoms or to seek the care of the closest emergency department if she worsens with the above plan.   Deliah Boston, MHS, PA-C [facility] 10/11/2015 1:10 PM

## 2015-10-12 ENCOUNTER — Encounter: Payer: Self-pay | Admitting: Family Medicine

## 2015-10-12 NOTE — Telephone Encounter (Signed)
Informed pt of this, she understands and will continue to take her current medication

## 2015-10-19 NOTE — Telephone Encounter (Signed)
I did call and speak with patient. She has decided to proceed with Paroxitene given other SSRI options.  She will follow up with myself or Dr. Katrinka BlazingSmith in clinic for refills or further medication management.  Deliah BostonMichael Jasn Xia, MS, PA-C 11:11 AM, 10/19/2015

## 2015-11-04 ENCOUNTER — Encounter: Payer: Self-pay | Admitting: Physician Assistant

## 2015-11-10 ENCOUNTER — Other Ambulatory Visit: Payer: Self-pay | Admitting: Physician Assistant

## 2015-11-13 ENCOUNTER — Encounter: Payer: Self-pay | Admitting: Physician Assistant

## 2015-11-13 NOTE — Telephone Encounter (Signed)
She had an email in her chart stating she didn't hear from the psychologist yet. Can we refill?

## 2015-12-09 ENCOUNTER — Other Ambulatory Visit: Payer: Self-pay | Admitting: Physician Assistant

## 2015-12-14 ENCOUNTER — Ambulatory Visit (INDEPENDENT_AMBULATORY_CARE_PROVIDER_SITE_OTHER): Payer: BC Managed Care – PPO | Admitting: Physician Assistant

## 2015-12-14 VITALS — BP 120/85 | HR 112 | Temp 97.6°F | Resp 16 | Ht 64.0 in | Wt 201.8 lb

## 2015-12-14 DIAGNOSIS — N943 Premenstrual tension syndrome: Secondary | ICD-10-CM

## 2015-12-14 DIAGNOSIS — F32A Depression, unspecified: Secondary | ICD-10-CM

## 2015-12-14 DIAGNOSIS — F418 Other specified anxiety disorders: Secondary | ICD-10-CM

## 2015-12-14 DIAGNOSIS — N951 Menopausal and female climacteric states: Secondary | ICD-10-CM

## 2015-12-14 DIAGNOSIS — R350 Frequency of micturition: Secondary | ICD-10-CM | POA: Diagnosis not present

## 2015-12-14 DIAGNOSIS — G43829 Menstrual migraine, not intractable, without status migrainosus: Secondary | ICD-10-CM | POA: Diagnosis not present

## 2015-12-14 DIAGNOSIS — F419 Anxiety disorder, unspecified: Secondary | ICD-10-CM

## 2015-12-14 DIAGNOSIS — R232 Flushing: Secondary | ICD-10-CM

## 2015-12-14 DIAGNOSIS — F329 Major depressive disorder, single episode, unspecified: Secondary | ICD-10-CM

## 2015-12-14 LAB — POCT URINALYSIS DIP (MANUAL ENTRY)
Bilirubin, UA: NEGATIVE
Glucose, UA: NEGATIVE
Ketones, POC UA: NEGATIVE
Nitrite, UA: NEGATIVE
PROTEIN UA: NEGATIVE
Spec Grav, UA: 1.02
Urobilinogen, UA: 0.2
pH, UA: 5.5

## 2015-12-14 LAB — POC MICROSCOPIC URINALYSIS (UMFC): MUCUS RE: ABSENT

## 2015-12-14 MED ORDER — NITROFURANTOIN MONOHYD MACRO 100 MG PO CAPS: 100.0000 mg | ORAL_CAPSULE | Freq: Two times a day (BID) | ORAL | 0 refills | Status: AC

## 2015-12-14 MED ORDER — RIZATRIPTAN BENZOATE 10 MG PO TBDP: 10.0000 mg | ORAL_TABLET | ORAL | 11 refills | Status: DC | PRN

## 2015-12-14 MED ORDER — VENLAFAXINE HCL ER 37.5 MG PO CP24: 37.5000 mg | ORAL_CAPSULE | Freq: Every day | ORAL | 1 refills | Status: DC

## 2015-12-14 MED ORDER — CLONAZEPAM 1 MG PO TABS: 0.5000 mg | ORAL_TABLET | Freq: Two times a day (BID) | ORAL | 0 refills | Status: DC | PRN

## 2015-12-14 NOTE — Patient Instructions (Signed)
     IF you received an x-ray today, you will receive an invoice from [facility]. Please contact [facility] at [phone] with questions or concerns regarding your invoice.   IF you received labwork today, you will receive an invoice from [facility]. Please contact [facility] at [phone] with questions or concerns regarding your invoice.   Our billing staff will not be able to assist you with questions regarding bills from these companies.  You will be contacted with the lab results as soon as they are available. The fastest way to get your results is to activate your My Chart account. Instructions are located on the last page of this paperwork. If you have not heard from us regarding the results in 2 weeks, please contact this office.      

## 2015-12-14 NOTE — Progress Notes (Deleted)
   12/14/2015 5:26 PM   DOB: 06/02/1960 / MRN: [id number]  SUBJECTIVE:  Sandra Mills is a 55 y.o. female presenting for   She has no allergies on file.   She  has a past medical history of Anxiety; Colon polyps (12/09/12); IBS (irritable bowel syndrome); Migraine; and Tremor, essential.    She  reports that she has never smoked. She does not have any smokeless tobacco history on file. She reports that she does not drink alcohol or use drugs. She  reports that she currently engages in sexual activity. She reports using the following method of birth control/protection: Surgical. The patient  has a past surgical history that includes Cyst resection perineal and Colonoscopy w/ polypectomy (12/09/12).  Her family history includes Atrial fibrillation in her father and sister; Breast cancer in her other and other; Breast cancer (age of onset: 9050) in her mother; Cancer (age of onset: 3050) in her mother.  ROS  The problem list and medications were reviewed and updated by myself where necessary and exist elsewhere in the encounter.   OBJECTIVE:  BP 120/85 (BP Location: Left Arm, Patient Position: Sitting, Cuff Size: Normal)   Pulse (!) 112   Temp 97.6 F (36.4 C) (Oral)   Resp 16   Ht 5\' 4"  (1.626 m)   Wt 201 lb 12.8 oz (91.5 kg)   LMP 11/27/2015   SpO2 96%   BMI 34.64 kg/m   Physical Exam  Results for orders placed or performed in visit on 12/14/15 (from the past 72 hour(s))  POCT urinalysis dipstick     Status: Abnormal   Collection Time: 12/14/15  5:19 PM  Result Value Ref Range   Color, UA yellow yellow   Clarity, UA clear clear   Glucose, UA negative negative   Bilirubin, UA negative negative   Ketones, POC UA negative negative   Spec Grav, UA 1.020    Blood, UA trace-lysed (A) negative   pH, UA 5.5    Protein Ur, POC negative negative   Urobilinogen, UA 0.2    Nitrite, UA Negative Negative   Leukocytes, UA small (1+) (A) Negative  POCT Microscopic Urinalysis ([facility])      Status: Abnormal   Collection Time: 12/14/15  5:24 PM  Result Value Ref Range   WBC,UR,HPF,POC Many (A) None WBC/hpf   RBC,UR,HPF,POC None None RBC/hpf   Bacteria None None, Too numerous to count   Mucus Absent Absent   Epithelial Cells, UR Per Microscopy Moderate (A) None, Too numerous to count cells/hpf    No results found.  ASSESSMENT AND PLAN  Sandra Mills was seen today for urinary frequency, other and other.  Diagnoses and all orders for this visit:  Urine frequency -     POCT urinalysis dipstick -     POCT Microscopic Urinalysis ([facility])    The patient is advised to call or return to clinic if she does not see an improvement in symptoms, or to seek the care of the closest emergency department if she worsens with the above plan.   Deliah BostonMichael Janiene Aarons, MHS, PA-C [facility] 12/14/2015 5:26 PM

## 2015-12-14 NOTE — Progress Notes (Signed)
Patient ID: Sandra NorseDonna F Bartosiewicz, female    DOB: 05/29/1960, 55 y.o.   MRN: [id number]  PCP: Nilda SimmerSMITH,KRISTI, MD  Subjective:   Chief Complaint  Patient presents with  . Urinary Frequency  . Other    depression scale 8   . Other    medication questions    HPI Presents for evaluation of increased urinary frequency beginning 8/14 or 15. She is accompanied by her husband.  Increased oral hydration, which has helped. Lower abdominal pain resolved. No hematuria. No atypical vaginal discharge. Monogamous sex with her husband. Today, back has been hurting. No vomiting, but having nausea. No fever, chills.  Has switched from Lexapro to Paxil. Significantly improved her anxiety and depression. Experiencing severe hot flashes. Prevents her from sleeping well.    Review of Systems As above.  Dep[facility] 12/14/2015 10/11/2015 01/08/2015 10/16/2014  Decreased Interest 1 0 0 0  Down, Depressed, Hopeless 1 0 0 0  PHQ - 2 Score 2 0 0 0  Altered sleeping 3 - - -  Tired, decreased energy 1 - - -  Change in appetite 0 - - -  Feeling bad or failure about yourself  2 - - -  Trouble concentrating 0 - - -  Moving slowly or fidgety/restless 0 - - -  Suicidal thoughts 0 - - -  PHQ-9 Score 8 - - -       Patient Active Problem List   Diagnosis Date Noted  . DEPRESSION 06/23/2007  . TREMOR, ESSENTIAL 06/23/2007  . MIGRAINE, MENSTRUAL 06/23/2007     Prior to Admission medications   Medication Sig Start Date End Date Taking? Authorizing Provider  PARoxetine (PAXIL) 20 MG tablet TAKE 1 TABLET BY MOUTH EVERY MORNING MAY INCREASE BY 1/2 TABLET WEEKLY **MAX DOSE OF 50MG  IN AM** 12/11/15  Yes Ofilia NeasMichael L Clark, PA-C  clonazePAM (KLONOPIN) 1 MG tablet Take 0.5-1 tablets (0.5-1 mg total) by mouth 2 (two) times daily as needed for anxiety. Patient not taking: Reported on 12/14/2015 10/11/15   Ofilia NeasMichael L Clark, PA-C  rizatriptan (MAXALT-MLT) 10 MG disintegrating tablet Take 1 tablet (10  mg total) by mouth as needed for migraine. May repeat in 2 hours if needed Patient not taking: Reported on 12/14/2015 01/08/15   Ethelda ChickKristi M Smith, MD     Not on File     Objective:  Physical Exam  Constitutional: She is oriented to person, place, and time. She appears well-developed and well-nourished. She is active and cooperative. No distress.  BP 120/85 (BP Location: Left Arm, Patient Position: Sitting, Cuff Size: Normal)   Pulse (!) 112   Temp 97.6 F (36.4 C) (Oral)   Resp 16   Ht 5\' 4"  (1.626 m)   Wt 201 lb 12.8 oz (91.5 kg)   LMP 11/27/2015   SpO2 96%   BMI 34.64 kg/m   HENT:  Head: Normocephalic and atraumatic.  Right Ear: Hearing normal.  Left Ear: Hearing normal.  Eyes: Conjunctivae are normal. No scleral icterus.  Neck: Normal range of motion. Neck supple. No thyromegaly present.  Cardiovascular: Normal rate, regular rhythm and normal heart sounds.   Pulses:      Radial pulses are 2+ on the right side, and 2+ on the left side.  Pulmonary/Chest: Effort normal and breath sounds normal.  Abdominal: Normal appearance and bowel sounds are normal. There is no hepatosplenomegaly. There is no tenderness. There is no CVA tenderness.  Lymphadenopathy:       Head (right side): No  tonsillar, no preauricular, no posterior auricular and no occipital adenopathy present.       Head (left side): No tonsillar, no preauricular, no posterior auricular and no occipital adenopathy present.    She has no cervical adenopathy.       Right: No supraclavicular adenopathy present.       Left: No supraclavicular adenopathy present.  Neurological: She is alert and oriented to person, place, and time. No sensory deficit.  Skin: Skin is warm and intact. No rash noted. She is diaphoretic. No cyanosis or erythema. Nails show no clubbing.  Psychiatric: She has a normal mood and affect. Her speech is normal and behavior is normal.       Results for orders placed or performed in visit on 12/14/15    POCT urinalysis dipstick  Result Value Ref Range   Color, UA yellow yellow   Clarity, UA clear clear   Glucose, UA negative negative   Bilirubin, UA negative negative   Ketones, POC UA negative negative   Spec Grav, UA 1.020    Blood, UA trace-lysed (A) negative   pH, UA 5.5    Protein Ur, POC negative negative   Urobilinogen, UA 0.2    Nitrite, UA Negative Negative   Leukocytes, UA small (1+) (A) Negative  POCT Microscopic Urinalysis ([facility])  Result Value Ref Range   WBC,UR,HPF,POC Many (A) None WBC/hpf   RBC,UR,HPF,POC None None RBC/hpf   Bacteria None None, Too numerous to count   Mucus Absent Absent   Epithelial Cells, UR Per Microscopy Moderate (A) None, Too numerous to count cells/hpf       Assessment & Plan:   1. Urine frequency While UA looks contaminated and not like an infection, her symptoms suggest UTI. Treat empirically and await UCx. - POCT urinalysis dipstick - POCT Microscopic Urinalysis ([facility]) - Urine culture - nitrofurantoin, macrocrystal-monohydrate, (MACROBID) 100 MG capsule; Take 1 capsule (100 mg total) by mouth 2 (two) times daily.  Dispense: 20 capsule; Refill: 0  2. Anxiety and depression Well controlled on paroxetine, but hot flashes are intolerable. Stop paroxetine and replace with venlafaxine XR. Also try peppermint oil behind the ears PRN. Initial adverse effects of venlafaxine can be treated with PRN clonazepam. - clonazePAM (KLONOPIN) 1 MG tablet; Take 0.5-1 tablets (0.5-1 mg total) by mouth 2 (two) times daily as needed for anxiety.  Dispense: 45 tablet; Refill: 0 - venlafaxine XR (EFFEXOR-XR) 37.5 MG 24 hr capsule; Take 1-2 capsules (37.5-75 mg total) by mouth daily with breakfast.  Dispense: 60 capsule; Refill: 1  3. Hot flashes Exacerbated by paroxetine. As above. - venlafaxine XR (EFFEXOR-XR) 37.5 MG 24 hr capsule; Take 1-2 capsules (37.5-75 mg total) by mouth daily with breakfast.  Dispense: 60 capsule; Refill: 1  4. Menstrual migraine  without status migrainosus, not intractable Stable. Refill rizatriptan. - rizatriptan (MAXALT-MLT) 10 MG disintegrating tablet; Take 1 tablet (10 mg total) by mouth as needed for migraine. May repeat in 2 hours if needed  Dispense: 8 tablet; Refill: 11   Fernande Brashelle S. Gillian Meeuwsen, PA-C Physician Assistant-Certified [facility]

## 2015-12-16 LAB — URINE CULTURE

## 2016-01-10 ENCOUNTER — Encounter: Payer: Self-pay | Admitting: Physician Assistant

## 2016-01-10 DIAGNOSIS — F32A Depression, unspecified: Secondary | ICD-10-CM

## 2016-01-10 DIAGNOSIS — F329 Major depressive disorder, single episode, unspecified: Secondary | ICD-10-CM

## 2016-01-10 DIAGNOSIS — F419 Anxiety disorder, unspecified: Principal | ICD-10-CM

## 2016-01-20 ENCOUNTER — Other Ambulatory Visit: Payer: Self-pay | Admitting: Physician Assistant

## 2016-01-20 DIAGNOSIS — F329 Major depressive disorder, single episode, unspecified: Secondary | ICD-10-CM

## 2016-01-20 DIAGNOSIS — F419 Anxiety disorder, unspecified: Principal | ICD-10-CM

## 2016-01-20 DIAGNOSIS — F32A Depression, unspecified: Secondary | ICD-10-CM

## 2016-01-21 MED ORDER — CLONAZEPAM 1 MG PO TABS: 0.5000 mg | ORAL_TABLET | Freq: Two times a day (BID) | ORAL | 0 refills | Status: DC | PRN

## 2016-01-21 NOTE — Telephone Encounter (Signed)
Faxed,

## 2016-01-21 NOTE — Telephone Encounter (Signed)
Meds ordered this encounter  Medications  . clonazePAM (KLONOPIN) 1 MG tablet    Sig: Take 0.5-1 tablets (0.5-1 mg total) by mouth 2 (two) times daily as needed for anxiety.    Dispense:  45 tablet    Refill:  0    Order Specific Question:   Supervising Provider    Answer:   Clelia CroftSHAW, EVA N [[id number]]

## 2016-01-30 NOTE — Telephone Encounter (Signed)
[facility]

## 2016-01-31 ENCOUNTER — Other Ambulatory Visit: Payer: Self-pay | Admitting: Physician Assistant

## 2016-01-31 DIAGNOSIS — F419 Anxiety disorder, unspecified: Principal | ICD-10-CM

## 2016-01-31 DIAGNOSIS — F329 Major depressive disorder, single episode, unspecified: Secondary | ICD-10-CM

## 2016-01-31 DIAGNOSIS — F32A Depression, unspecified: Secondary | ICD-10-CM

## 2016-01-31 DIAGNOSIS — R232 Flushing: Secondary | ICD-10-CM

## 2016-02-25 ENCOUNTER — Other Ambulatory Visit: Payer: Self-pay | Admitting: Physician Assistant

## 2016-02-25 DIAGNOSIS — R232 Flushing: Secondary | ICD-10-CM

## 2016-02-25 DIAGNOSIS — F419 Anxiety disorder, unspecified: Principal | ICD-10-CM

## 2016-02-25 DIAGNOSIS — F329 Major depressive disorder, single episode, unspecified: Secondary | ICD-10-CM

## 2016-02-25 DIAGNOSIS — F32A Depression, unspecified: Secondary | ICD-10-CM

## 2016-02-26 ENCOUNTER — Other Ambulatory Visit: Payer: Self-pay | Admitting: Physician Assistant

## 2016-02-26 DIAGNOSIS — F32A Depression, unspecified: Secondary | ICD-10-CM

## 2016-02-26 DIAGNOSIS — F419 Anxiety disorder, unspecified: Principal | ICD-10-CM

## 2016-02-26 DIAGNOSIS — F329 Major depressive disorder, single episode, unspecified: Secondary | ICD-10-CM

## 2016-02-26 DIAGNOSIS — R232 Flushing: Secondary | ICD-10-CM

## 2016-02-27 MED ORDER — VENLAFAXINE HCL ER 37.5 MG PO CP24
37.5000 mg | ORAL_CAPSULE | Freq: Every day | ORAL | 3 refills | Status: DC
Start: 2016-02-27 — End: 2016-03-03

## 2016-02-27 NOTE — Telephone Encounter (Signed)
Sent in RFs and notified pt on Mychart. 

## 2016-03-03 ENCOUNTER — Ambulatory Visit (INDEPENDENT_AMBULATORY_CARE_PROVIDER_SITE_OTHER): Payer: BC Managed Care – PPO | Admitting: Physician Assistant

## 2016-03-03 VITALS — BP 114/74 | HR 99 | Temp 97.9°F | Resp 17 | Ht 64.0 in | Wt 192.0 lb

## 2016-03-03 DIAGNOSIS — Z23 Encounter for immunization: Secondary | ICD-10-CM | POA: Diagnosis not present

## 2016-03-03 DIAGNOSIS — F418 Other specified anxiety disorders: Secondary | ICD-10-CM

## 2016-03-03 DIAGNOSIS — R232 Flushing: Secondary | ICD-10-CM

## 2016-03-03 DIAGNOSIS — F32A Depression, unspecified: Secondary | ICD-10-CM

## 2016-03-03 DIAGNOSIS — F329 Major depressive disorder, single episode, unspecified: Secondary | ICD-10-CM

## 2016-03-03 DIAGNOSIS — F419 Anxiety disorder, unspecified: Principal | ICD-10-CM

## 2016-03-03 MED ORDER — CLONAZEPAM 1 MG PO TABS: 0.5000 mg | ORAL_TABLET | Freq: Two times a day (BID) | ORAL | 0 refills | Status: DC | PRN

## 2016-03-03 MED ORDER — VENLAFAXINE HCL ER 37.5 MG PO CP24: 112.5000 mg | ORAL_CAPSULE | Freq: Every day | ORAL | 3 refills | Status: DC

## 2016-03-03 NOTE — Progress Notes (Signed)
Subjective:    Patient ID: Sandra Mills, female    DOB: 01/16/1961, 55 y.o.   MRN: [id number]  HPI: Presents for refill of her Effexor and Klonopin for her anxiey and depression. States she started taking the Effexor as three 37.5 mg pills at once in the morning about a month ago as recommended by Porfirio Oarhelle Jeffery and noticed it was managing her anxiety well along with the Klonopin 1 mg at night for sleep. Recently was beginning to run out of the Effexor so started taking two pills again instead of the three and noticed some increased anxiety when she had to do that. States there was one evening where she was not able to sleep because she was so down and depressed and had a crying episode and could not sleep even after taking 2 Klonopin and therefore ended up having to miss work the next day. Much of her current anxiety and depression comes from recent separation with her husband. States she moved out and into her own apartment 3 weeks ago. States her children have been good about coming to stay with her, especially her older son.   Prefers to do the recommended three pills of Effexor at once in the morning and then the Klonopin at night for sleep. Denies medication side effects.  Has been using the [employer] diet program for weight loss and has lost 10 lbs in the past month. States she is using Rizatriptan for migraines as needed, usually only about once a month and does not currently need a refill of this.   Review of Systems Pertinent ROS mentioned above in HPI  No Known Allergies  Prior to Admission medications   Medication Sig Start Date End Date Taking? Authorizing Provider  clonazePAM (KLONOPIN) 1 MG tablet Take 0.5-1 tablets (0.5-1 mg total) by mouth 2 (two) times daily as needed for anxiety. 03/03/16  Yes Chelle Jeffery, PA-C  rizatriptan (MAXALT-MLT) 10 MG disintegrating tablet Take 1 tablet (10 mg total) by mouth as needed for migraine. May repeat in 2 hours if needed 12/14/15  Yes  Chelle Leotis ShamesJeffery, PA-C  venlafaxine XR (EFFEXOR-XR) 37.5 MG 24 hr capsule Take 3 capsules (112.5 mg total) by mouth daily with breakfast. 03/03/16  Yes Porfirio Oarhelle Jeffery, PA-C   Patient Active Problem List   Diagnosis Date Noted  . Anxiety and depression 06/23/2007  . TREMOR, ESSENTIAL 06/23/2007  . Migraine, menstrual 06/23/2007      Objective:   Physical Exam  Constitutional: She is oriented to person, place, and time. She appears well-developed and well-nourished.  HENT:  Head: Normocephalic and atraumatic.  Eyes: Conjunctivae are normal. Pupils are equal, round, and reactive to light. Right eye exhibits no discharge. Left eye exhibits no discharge. No scleral icterus.  Neck: Normal range of motion. Neck supple. No tracheal deviation present. No thyromegaly present.  Cardiovascular: Normal rate, regular rhythm and intact distal pulses.  Exam reveals no gallop and no friction rub.   No murmur heard. Pulmonary/Chest: Effort normal and breath sounds normal. No respiratory distress. She has no wheezes. She has no rales.  Lymphadenopathy:    She has no cervical adenopathy.  Neurological: She is alert and oriented to person, place, and time.  Skin: Skin is warm and dry.  Psychiatric: She has a normal mood and affect. Her behavior is normal. Judgment and thought content normal.       Assessment & Plan:  1. Anxiety and depression Currently controlled on medication.Take Effexor-XR 37.5 mg 3 tablets PO daily  and Klonopin 1 mg each night. RTC if symptoms are no longer well-controlled on medication. Recommended patient to schedule wellness visit as she has several items of overdue health maintenance. - venlafaxine XR (EFFEXOR-XR) 37.5 MG 24 hr capsule; Take 3 capsules (112.5 mg total) by mouth daily with breakfast.  Dispense: 90 capsule; Refill: 3 - clonazePAM (KLONOPIN) 1 MG tablet; Take 0.5-1 tablets (0.5-1 mg total) by mouth 2 (two) times daily as needed for anxiety.  Dispense: 45 tablet; Refill:  0

## 2016-03-03 NOTE — Patient Instructions (Addendum)
In 1 month, if you are still needing the clonazepam every night, let's try increasing the venlafaxine to 150 mg (4 of the 37.5 mg capsules).    IF you received an x-ray today, you will receive an invoice from [facility]. Please contact [facility] at [phone] with questions or concerns regarding your invoice.   IF you received labwork today, you will receive an invoice from [facility]. Please contact [facility] at [phone] with questions or concerns regarding your invoice.   Our billing staff will not be able to assist you with questions regarding bills from these companies.  You will be contacted with the lab results as soon as they are available. The fastest way to get your results is to activate your My Chart account. Instructions are located on the last page of this paperwork. If you have not heard from us regarding the results in 2 weeks, please contact this office.     We recommend that you schedule a mammogram for breast cancer screening. Typically, you do not need a referral to do this. Please contact a local imaging center to schedule your mammogram.  [facility] - [phone]  *ask for the Radiology Department [facility] - [phone] or [phone]  [facility] - [phone] [facility] - [phone] [facility] - [phone]  *ask for the Radiology Department [facility] - [phone]  *ask for the Radiology Department [facility] - [phone]  *ask for the Mammography Department [facility] - [phone]

## 2016-03-03 NOTE — Progress Notes (Signed)
Patient ID: Sandra NorseDonna F Mills, female    DOB: 12/06/1960, 55 y.o.   MRN: [id number]  PCP: Nilda SimmerSMITH,KRISTI, MD  Subjective:   Chief Complaint  Patient presents with  . Medication Refill    klonopin and effexor     HPI Presents for medication refills.  Venlafaxine has been working well. Started in August due to increased anxiety related to marital stress. Currently taking 112.5 mg daily, and using clonazepam QHS for sleep. As the originally Rx for venlafaxine was for 75 mg dosing, though she was advised she could increase the dose to 3 capsules if needed, she began to run out, and reduced the dose back to 75 mg, noting increased anxiety.  Has had only one episode of increased symptoms such that she was unable to sleep (even after taking a second clonazepam dose).  She is in her own living space now, for about 3 weeks, and her children have been supportive, coming to visit and stay with her there. Successful weight loss with [employer].    Review of Systems As above.  Depression screen [facility] 2/9 12/14/2015 10/11/2015 01/08/2015 10/16/2014  Decreased Interest 1 0 0 0  Down, Depressed, Hopeless 1 0 0 0  PHQ - 2 Score 2 0 0 0  Altered sleeping 3 - - -  Tired, decreased energy 1 - - -  Change in appetite 0 - - -  Feeling bad or failure about yourself  2 - - -  Trouble concentrating 0 - - -  Moving slowly or fidgety/restless 0 - - -  Suicidal thoughts 0 - - -  PHQ-9 Score 8 - - -       Patient Active Problem List   Diagnosis Date Noted  . Anxiety and depression 06/23/2007  . TREMOR, ESSENTIAL 06/23/2007  . Migraine, menstrual 06/23/2007     Prior to Admission medications   Medication Sig Start Date End Date Taking? Authorizing Provider  clonazePAM (KLONOPIN) 1 MG tablet Take 0.5-1 tablets (0.5-1 mg total) by mouth 2 (two) times daily as needed for anxiety. 01/21/16  Yes Chardae Mulkern, PA-C  rizatriptan (MAXALT-MLT) 10 MG disintegrating tablet Take 1 tablet (10 mg total) by  mouth as needed for migraine. May repeat in 2 hours if needed 12/14/15  Yes Reinette Cuneo Leotis ShamesJeffery, PA-C  venlafaxine XR (EFFEXOR-XR) 37.5 MG 24 hr capsule Take 1-2 capsules (37.5-75 mg total) by mouth daily with breakfast. 02/27/16  Yes Jacarius Handel, PA-C     No Known Allergies     Objective:  Physical Exam  Constitutional: She is oriented to person, place, and time. She appears well-developed and well-nourished. She is active and cooperative. No distress.  BP 114/74 (BP Location: Right Arm, Patient Position: Sitting, Cuff Size: Large)   Pulse 99   Temp 97.9 F (36.6 C) (Oral)   Resp 17   Ht 5\' 4"  (1.626 m)   Wt 192 lb (87.1 kg)   LMP  (LMP Unknown)   SpO2 96%   BMI 32.96 kg/m   HENT:  Head: Normocephalic and atraumatic.  Right Ear: Hearing normal.  Left Ear: Hearing normal.  Eyes: Conjunctivae are normal. No scleral icterus.  Neck: Normal range of motion. Neck supple. No thyromegaly present.  Cardiovascular: Normal rate, regular rhythm and normal heart sounds.   Pulses:      Radial pulses are 2+ on the right side, and 2+ on the left side.  Pulmonary/Chest: Effort normal and breath sounds normal.  Lymphadenopathy:       Head (right  side): No tonsillar, no preauricular, no posterior auricular and no occipital adenopathy present.       Head (left side): No tonsillar, no preauricular, no posterior auricular and no occipital adenopathy present.    She has no cervical adenopathy.       Right: No supraclavicular adenopathy present.       Left: No supraclavicular adenopathy present.  Neurological: She is alert and oriented to person, place, and time. No sensory deficit.  Skin: Skin is warm, dry and intact. No rash noted. No cyanosis or erythema. Nails show no clubbing.  Psychiatric: She has a normal mood and affect. Her speech is normal and behavior is normal.           Assessment & Plan:   1. Anxiety and depression Much improved. Continue 112.5 mg daily, and let me know if she'd  like to increase to 150 mg. - venlafaxine XR (EFFEXOR-XR) 37.5 MG 24 hr capsule; Take 3 capsules (112.5 mg total) by mouth daily with breakfast.  Dispense: 90 capsule; Refill: 3 - clonazePAM (KLONOPIN) 1 MG tablet; Take 0.5-1 tablets (0.5-1 mg total) by mouth 2 (two) times daily as needed for anxiety.  Dispense: 45 tablet; Refill: 0  2. Need for prophylactic vaccination and inoculation against influenza - Flu Vaccine QUAD 36+ mos IM   Fernande Brashelle S. Lendy Dittrich, PA-C Physician Assistant-Certified [facility]

## 2016-03-19 ENCOUNTER — Encounter: Payer: Self-pay | Admitting: Physician Assistant

## 2016-04-17 ENCOUNTER — Encounter: Payer: Self-pay | Admitting: Physician Assistant

## 2016-04-17 ENCOUNTER — Other Ambulatory Visit: Payer: Self-pay | Admitting: Physician Assistant

## 2016-04-17 DIAGNOSIS — F419 Anxiety disorder, unspecified: Principal | ICD-10-CM

## 2016-04-17 DIAGNOSIS — F32A Depression, unspecified: Secondary | ICD-10-CM

## 2016-04-17 DIAGNOSIS — F329 Major depressive disorder, single episode, unspecified: Secondary | ICD-10-CM

## 2016-04-17 MED ORDER — CLONAZEPAM 1 MG PO TABS: 0.5000 mg | ORAL_TABLET | Freq: Two times a day (BID) | ORAL | 0 refills | Status: DC | PRN

## 2016-04-17 NOTE — Telephone Encounter (Signed)
Meds ordered this encounter  Medications  . clonazePAM (KLONOPIN) 1 MG tablet    Sig: Take 0.5-1 tablets (0.5-1 mg total) by mouth 2 (two) times daily as needed for anxiety.    Dispense:  45 tablet    Refill:  0    Order Specific Question:   Supervising Provider    Answer:   Sherren MochaSHAW, EVA N [phone][[id number]]   Patient notified via My Chart.

## 2016-05-05 ENCOUNTER — Ambulatory Visit (INDEPENDENT_AMBULATORY_CARE_PROVIDER_SITE_OTHER): Payer: BC Managed Care – PPO | Admitting: Physician Assistant

## 2016-05-05 VITALS — BP 110/66 | HR 110 | Temp 97.8°F | Resp 18 | Ht 64.0 in | Wt 183.0 lb

## 2016-05-05 DIAGNOSIS — K047 Periapical abscess without sinus: Secondary | ICD-10-CM | POA: Diagnosis not present

## 2016-05-05 DIAGNOSIS — R82998 Other abnormal findings in urine: Secondary | ICD-10-CM

## 2016-05-05 DIAGNOSIS — L299 Pruritus, unspecified: Secondary | ICD-10-CM | POA: Diagnosis not present

## 2016-05-05 DIAGNOSIS — R8299 Other abnormal findings in urine: Secondary | ICD-10-CM

## 2016-05-05 LAB — POC MICROSCOPIC URINALYSIS (UMFC): Mucus: ABSENT

## 2016-05-05 LAB — POCT CBC
Granulocyte percent: 63.4 %G (ref 37–80)
HCT, POC: 37 % — AB (ref 37.7–47.9)
HEMOGLOBIN: 12.9 g/dL (ref 12.2–16.2)
Lymph, poc: 2.2 (ref 0.6–3.4)
MCH, POC: 31.5 pg — AB (ref 27–31.2)
MCHC: 34.8 g/dL (ref 31.8–35.4)
MCV: 90.5 fL (ref 80–97)
MID (cbc): 0.6 (ref 0–0.9)
MPV: 6.8 fL (ref 0–99.8)
POC Granulocyte: 4.9 (ref 2–6.9)
POC LYMPH PERCENT: 28.4 %L (ref 10–50)
POC MID %: 8.2 % (ref 0–12)
Platelet Count, POC: 309 10*3/uL (ref 142–424)
RBC: 4.09 M/uL (ref 4.04–5.48)
RDW, POC: 14.9 %
WBC: 7.8 10*3/uL (ref 4.6–10.2)

## 2016-05-05 LAB — POCT URINALYSIS DIP (MANUAL ENTRY)
Bilirubin, UA: NEGATIVE
Glucose, UA: NEGATIVE
Ketones, POC UA: NEGATIVE
NITRITE UA: NEGATIVE
PH UA: 5.5
PROTEIN UA: NEGATIVE
Spec Grav, UA: >=1.03
Urobilinogen, UA: 0.2

## 2016-05-05 MED ORDER — CETIRIZINE HCL 10 MG PO TABS
10.0000 mg | ORAL_TABLET | Freq: Every day | ORAL | 0 refills | Status: DC
Start: 2016-05-05 — End: 2016-05-31

## 2016-05-05 MED ORDER — AMOXICILLIN-POT CLAVULANATE 875-125 MG PO TABS: 1.0000 | ORAL_TABLET | Freq: Two times a day (BID) | ORAL | 0 refills | Status: DC

## 2016-05-05 NOTE — Patient Instructions (Signed)
     IF you received an x-ray today, you will receive an invoice from [facility]. Please contact [facility] at [phone] with questions or concerns regarding your invoice.   IF you received labwork today, you will receive an invoice from [facility]. Please contact [facility] at [phone] with questions or concerns regarding your invoice.   Our billing staff will not be able to assist you with questions regarding bills from these companies.  You will be contacted with the lab results as soon as they are available. The fastest way to get your results is to activate your My Chart account. Instructions are located on the last page of this paperwork. If you have not heard from us regarding the results in 2 weeks, please contact this office.     

## 2016-05-05 NOTE — Progress Notes (Signed)
05/05/2016 6:47 PM   DOB: 09/17/1960 / MRN: [id number]  SUBJECTIVE:  Sandra Mills is a 56 y.o. female presenting for itching.  Reports this started after a dental procedure in which a molar was extracted. States that she had a rash that "itches all over" and caused her to be red.  Has been taking tylenol 1000 mg q4-q6 as needed for pain.    Complains of excessive pain at the sight of the tooth extraction and feel this is getting worse.  Describes the pain a pulsatile.  Feels she is getting worse. Has pain with anything touching the area and has notice some pus coming from the gum.   She has No Known Allergies.   She  has a past medical history of Anxiety; Colon polyps (12/09/12); IBS (irritable bowel syndrome); Migraine; and Tremor, essential.    She  reports that she has never smoked. She has never used smokeless tobacco. She reports that she does not drink alcohol or use drugs. She  reports that she currently engages in sexual activity. She reports using the following method of birth control/protection: Surgical. The patient  has a past surgical history that includes Cyst resection perineal and Colonoscopy w/ polypectomy (12/09/12).  Her family history includes Atrial fibrillation in her father and sister; Breast cancer in her other and other; Breast cancer (age of onset: 78) in her mother; Cancer (age of onset: 67) in her mother.  Review of Systems  Constitutional: Negative for chills and fever.  HENT: Negative for sore throat.   Respiratory: Negative for cough.   Gastrointestinal: Negative for abdominal pain and nausea.  Genitourinary: Negative for dysuria.  Skin: Positive for itching and rash.  Neurological: Negative for dizziness, sensory change and headaches.    The problem list and medications were reviewed and updated by myself where necessary and exist elsewhere in the encounter.   OBJECTIVE:  BP 110/66 (BP Location: Right Arm, Patient Position: Sitting, Cuff Size: Small)    Pulse (!) 110   Temp 97.8 F (36.6 C) (Oral)   Resp 18   Ht '5\' 4"'$  (1.626 m)   Wt 183 lb (83 kg)   SpO2 99%   BMI 31.41 kg/m   Pulse Readings from Last 3 Encounters:  05/05/16 (!) 110  03/03/16 99  12/14/15 (!) 112   Lab Results  Component Value Date   TSH 1.68 10/11/2015    Physical Exam  Constitutional: She is oriented to person, place, and time.  HENT:  Mouth/Throat:    Cardiovascular: Normal rate and regular rhythm.   Pulmonary/Chest: Effort normal and breath sounds normal.  Musculoskeletal: Normal range of motion.  Neurological: She is alert and oriented to person, place, and time.  Skin: Skin is warm and dry. No rash noted. No erythema. No pallor.    Results for orders placed or performed in visit on 05/05/16 (from the past 72 hour(s))  POCT CBC     Status: Abnormal   Collection Time: 05/05/16 12:37 PM  Result Value Ref Range   WBC 7.8 4.6 - 10.2 K/uL   Lymph, poc 2.2 0.6 - 3.4   POC LYMPH PERCENT 28.4 10 - 50 %L   MID (cbc) 0.6 0 - 0.9   POC MID % 8.2 0 - 12 %M   POC Granulocyte 4.9 2 - 6.9   Granulocyte percent 63.4 37 - 80 %G   RBC 4.09 4.04 - 5.48 M/uL   Hemoglobin 12.9 12.2 - 16.2 g/dL   HCT, POC 37.0 (A)  37.7 - 47.9 %   MCV 90.5 80 - 97 fL   MCH, POC 31.5 (A) 27 - 31.2 pg   MCHC 34.8 31.8 - 35.4 g/dL   RDW, POC 14.9 %   Platelet Count, POC 309 142 - 424 K/uL   MPV 6.8 0 - 99.8 fL  POCT urinalysis dipstick     Status: Abnormal   Collection Time: 05/05/16 12:39 PM  Result Value Ref Range   Color, UA yellow yellow   Clarity, UA clear clear   Glucose, UA negative negative   Bilirubin, UA negative negative   Ketones, POC UA negative negative   Spec Grav, UA >=1.030    Blood, UA small (A) negative   pH, UA 5.5    Protein Ur, POC negative negative   Urobilinogen, UA 0.2    Nitrite, UA Negative Negative   Leukocytes, UA small (1+) (A) Negative  POCT Microscopic Urinalysis ([facility])     Status: Abnormal   Collection Time: 05/05/16 12:59 PM  Result  Value Ref Range   WBC,UR,HPF,POC Too numerous to count  (A) None WBC/hpf   RBC,UR,HPF,POC None None RBC/hpf   Bacteria Many (A) None, Too numerous to count   Mucus Absent Absent   Epithelial Cells, UR Per Microscopy Many (A) None, Too numerous to count cells/hpf    No results found.  ASSESSMENT AND PLAN:  Khari was seen today for rash.  Diagnoses and all orders for this visit:  Pruritus -     POCT CBC -     POCT urinalysis dipstick -     CMP14+EGFR -     cetirizine (ZYRTEC) 10 MG tablet; Take 1 tablet (10 mg total) by mouth daily.  Dental infection -     amoxicillin-clavulanate (AUGMENTIN) 875-125 MG tablet; Take 1 tablet by mouth 2 (two) times daily.  Urine leukocytes: She appears to have a UTI however the sample is contaminated.  I will culture.  Augmentin should cover.  -     POCT Microscopic Urinalysis ([facility]) -     Urine culture    The patient is advised to call or return to clinic if she does not see an improvement in symptoms, or to seek the care of the closest emergency department if she worsens with the above plan.   Philis Fendt, MHS, PA-C [facility] 05/05/2016 6:47 PM

## 2016-05-06 LAB — CMP14+EGFR
A/G RATIO: 1.5 (ref 1.2–2.2)
ALT: 42 IU/L — ABNORMAL HIGH (ref 0–32)
AST: 34 IU/L (ref 0–40)
Albumin: 4.2 g/dL (ref 3.5–5.5)
Alkaline Phosphatase: 81 IU/L (ref 39–117)
BUN/Creatinine Ratio: 10 (ref 9–23)
BUN: 6 mg/dL (ref 6–24)
Bilirubin Total: 0.3 mg/dL (ref 0.0–1.2)
CO2: 24 mmol/L (ref 18–29)
Calcium: 9.1 mg/dL (ref 8.7–10.2)
Chloride: 100 mmol/L (ref 96–106)
Creatinine, Ser: 0.6 mg/dL (ref 0.57–1.00)
GFR calc Af Amer: 120 mL/min/{1.73_m2} (ref >59)
GFR calc non Af Amer: 104 mL/min/{1.73_m2} (ref >59)
GLOBULIN, TOTAL: 2.8 g/dL (ref 1.5–4.5)
Glucose: 86 mg/dL (ref 65–99)
POTASSIUM: 4.2 mmol/L (ref 3.5–5.2)
SODIUM: 140 mmol/L (ref 134–144)
Total Protein: 7 g/dL (ref 6.0–8.5)

## 2016-05-07 ENCOUNTER — Other Ambulatory Visit: Payer: Self-pay | Admitting: *Deleted

## 2016-05-07 LAB — URINE CULTURE

## 2016-05-09 ENCOUNTER — Other Ambulatory Visit: Payer: Self-pay | Admitting: *Deleted

## 2016-05-09 DIAGNOSIS — R945 Abnormal results of liver function studies: Principal | ICD-10-CM

## 2016-05-09 DIAGNOSIS — R7989 Other specified abnormal findings of blood chemistry: Secondary | ICD-10-CM

## 2016-05-13 ENCOUNTER — Encounter: Payer: BC Managed Care – PPO | Admitting: Physician Assistant

## 2016-05-13 NOTE — Progress Notes (Deleted)
Patient ID: Sandra Mills, female    DOB: 11/18/1960, 56 y.o.   MRN: [id number]  PCP: Nilda SimmerSMITH,KRISTI, MD  No chief complaint on file.   Subjective:   Presents for [facility] [facility] Visit.  Cervical Cancer Screening: last pap 11/2011. Cytology and HPV negative. Repeat in 11/2016. Breast Cancer Screening: *** Colorectal Cancer Screening: *** Bone Density Testing: *** HIV Screening: *** STI Screening: *** Seasonal Influenza Vaccination: *** Td/Tdap Vaccination: *** Pneumococcal Vaccination: not yet a candidate Zoster Vaccination: *** Frequency of Dental evaluation: ***Q6 months Frequency of Eye evaluation: ***  ***    Patient Active Problem List   Diagnosis Date Noted  . Need for prophylactic vaccination and inoculation against influenza 03/03/2016  . Anxiety and depression 06/23/2007  . TREMOR, ESSENTIAL 06/23/2007  . Migraine, menstrual 06/23/2007    Past Medical History:  Diagnosis Date  . Anxiety   . Colon polyps 12/09/12   three polyps; [place]; repeat in 3-5 years.  . IBS (irritable bowel syndrome)    diarrhea predominant; s/p GI consult with colonoscopy  . Migraine    menstrually related.  . Tremor, essential      Prior to Admission medications   Medication Sig Start Date End Date Taking? Authorizing Provider  amoxicillin-clavulanate (AUGMENTIN) 875-125 MG tablet Take 1 tablet by mouth 2 (two) times daily. 05/05/16   Ofilia NeasMichael L Clark, PA-C  cetirizine (ZYRTEC) 10 MG tablet Take 1 tablet (10 mg total) by mouth daily. 05/05/16 05/15/16  Ofilia NeasMichael L Clark, PA-C  clonazePAM (KLONOPIN) 1 MG tablet Take 0.5-1 tablets (0.5-1 mg total) by mouth 2 (two) times daily as needed for anxiety. 04/17/16   Denika Krone, PA-C  rizatriptan (MAXALT-MLT) 10 MG disintegrating tablet Take 1 tablet (10 mg total) by mouth as needed for migraine. May repeat in 2 hours if needed 12/14/15   Porfirio Oarhelle Angle Karel, PA-C  venlafaxine XR (EFFEXOR-XR) 37.5 MG 24 hr capsule Take 3 capsules (112.5 mg total)  by mouth daily with breakfast. 03/03/16   Porfirio Oarhelle Imunique Samad, PA-C    No Known Allergies  Past Surgical History:  Procedure Laterality Date  . COLONOSCOPY W/ POLYPECTOMY  12/09/12   [place] polyps; [place]. Repeat in 3-5 years.  . Cyst resection perineal      Family History  Problem Relation Age of Onset  . Breast cancer Mother 7450  . Cancer Mother 4650    Breast cancer age 55  . Atrial fibrillation Father   . Atrial fibrillation Sister   . Breast cancer Other     maternal great aunt dx in her 1050s  . Breast cancer Other     6 maternal great aunts dx in their 7150s    Social History   Social History  . Marital status: Married    Spouse name: N/A  . Number of children: 4  . Years of education: N/A   Occupational History  . TEACHER [employer]   Social History Main Topics  . Smoking status: Never Smoker  . Smokeless tobacco: Never Used  . Alcohol use No  . Drug use: No  . Sexual activity: Yes    Birth control/ protection: Surgical     Comment: Husband vasectomy   Other Topics Concern  . Not on file   Social History Narrative   Marital status:  Married x 30 years; happily married; no abuse.      Lives: Lives with husband, 3/4 children ([phone], 9, 7317).  No grandchildren.      Children: 4 children; no grandchildren.  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  Previously worked [employer].   No tobacco/alcohol/drugs; wine three times per year.   Exercise sporadic; curves and [employer].   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         Review of Systems      Objective:  Physical Exam         Assessment & Plan:  ***

## 2016-05-31 ENCOUNTER — Other Ambulatory Visit: Payer: Self-pay | Admitting: Physician Assistant

## 2016-05-31 DIAGNOSIS — L299 Pruritus, unspecified: Secondary | ICD-10-CM

## 2016-06-02 ENCOUNTER — Other Ambulatory Visit: Payer: Self-pay | Admitting: Physician Assistant

## 2016-06-02 DIAGNOSIS — F329 Major depressive disorder, single episode, unspecified: Secondary | ICD-10-CM

## 2016-06-02 DIAGNOSIS — F419 Anxiety disorder, unspecified: Principal | ICD-10-CM

## 2016-06-02 DIAGNOSIS — F32A Depression, unspecified: Secondary | ICD-10-CM

## 2016-06-03 NOTE — Telephone Encounter (Signed)
Rx printed at 104. Will bring to 102 after clinic.  Meds ordered this encounter  Medications  . clonazePAM (KLONOPIN) 1 MG tablet    Sig: TAKE 1/2-1 TABLET BY MOUTH TWICE A DAY AS NEEDED FOR ANXIETY    Dispense:  45 tablet    Refill:  0    Not to exceed 5 additional fills before 10/14/2016.   Please advise the patient that she needs to schedule a wellness visit.

## 2016-06-04 NOTE — Telephone Encounter (Signed)
[facility]ed to [facility]

## 2016-07-07 ENCOUNTER — Other Ambulatory Visit: Payer: Self-pay | Admitting: Physician Assistant

## 2016-07-07 DIAGNOSIS — F329 Major depressive disorder, single episode, unspecified: Secondary | ICD-10-CM

## 2016-07-07 DIAGNOSIS — F419 Anxiety disorder, unspecified: Principal | ICD-10-CM

## 2016-07-07 DIAGNOSIS — R232 Flushing: Secondary | ICD-10-CM

## 2016-07-07 DIAGNOSIS — F32A Depression, unspecified: Secondary | ICD-10-CM

## 2016-07-07 NOTE — Telephone Encounter (Signed)
Answering service call - patient needing a refill on her Effexor. She has increase the dose to 150 mg qd which is 4 of her 37.5 mg capsules as instructed by her PCP Tinnie GensJeffrey (see MyChart email) so she is running out early. she had an old prescription for the effexor on file with a different sig which the pharmacist filled so she currently has a 2 week supply. She reports she is doing very well on the current dose of 150 mg daily so I sent in a 6 month supply and advised that she make a follow-up wellness visit with her PCP within the next several months as she was previously recommended.  Please call pt to see if she would like to schedule her complete physical with Chelle. thanks

## 2016-07-18 ENCOUNTER — Ambulatory Visit (INDEPENDENT_AMBULATORY_CARE_PROVIDER_SITE_OTHER): Payer: BC Managed Care – PPO | Admitting: Physician Assistant

## 2016-07-18 ENCOUNTER — Other Ambulatory Visit: Payer: Self-pay | Admitting: Physician Assistant

## 2016-07-18 DIAGNOSIS — R350 Frequency of micturition: Secondary | ICD-10-CM

## 2016-07-18 DIAGNOSIS — R7989 Other specified abnormal findings of blood chemistry: Secondary | ICD-10-CM | POA: Diagnosis not present

## 2016-07-18 DIAGNOSIS — R300 Dysuria: Secondary | ICD-10-CM

## 2016-07-18 DIAGNOSIS — N390 Urinary tract infection, site not specified: Secondary | ICD-10-CM

## 2016-07-18 DIAGNOSIS — R945 Abnormal results of liver function studies: Secondary | ICD-10-CM

## 2016-07-18 LAB — POCT URINALYSIS DIP (MANUAL ENTRY)
BILIRUBIN UA: NEGATIVE
GLUCOSE UA: NEGATIVE
Ketones, POC UA: NEGATIVE
NITRITE UA: NEGATIVE
Protein Ur, POC: 30 — AB
Spec Grav, UA: 1.015 (ref 1.030–1.035)
UROBILINOGEN UA: 0.2 (ref ?–2.0)
pH, UA: 5.5 (ref 5.0–8.0)

## 2016-07-18 LAB — POC MICROSCOPIC URINALYSIS (UMFC): MUCUS RE: ABSENT

## 2016-07-18 MED ORDER — SULFAMETHOXAZOLE-TRIMETHOPRIM 800-160 MG PO TABS: 1.0000 | ORAL_TABLET | Freq: Two times a day (BID) | ORAL | 0 refills | Status: DC

## 2016-07-18 NOTE — Patient Instructions (Addendum)
I placing you on bactrim for kidney infection.   I will contact you if the urine culture is revealing that your antibiotic will not cover.    Pyelonephritis, Adult Pyelonephritis is a kidney infection. The kidneys are organs that help clean your blood by moving waste out of your blood and into your pee (urine). This infection can happen quickly, or it can last for a long time. In most cases, it clears up with treatment and does not cause other problems. Follow these instructions at home: Medicines   Take over-the-counter and prescription medicines only as told by your doctor.  Take your antibiotic medicine as told by your doctor. Do not stop taking the medicine even if you start to feel better. General instructions   Drink enough fluid to keep your pee clear or pale yellow.  Avoid caffeine, tea, and carbonated drinks.  Pee (urinate) often. Avoid holding in pee for long periods of time.  Pee before and after sex.  After pooping (having a bowel movement), women should wipe from front to back. Use each tissue only once.  Keep all follow-up visits as told by your doctor. This is important. Contact a doctor if:  You do not feel better after 2 days.  Your symptoms get worse.  You have a fever. Get help right away if:  You cannot take your medicine or drink fluids as told.  You have chills and shaking.  You throw up (vomit).  You have very bad pain in your side (flank) or back.  You feel very weak or you pass out (faint). This information is not intended to replace advice given to you by your health care provider. Make sure you discuss any questions you have with your health care provider. Document Released: 05/22/2004 Document Revised: 09/20/2015 Document Reviewed: 08/07/2014 Elsevier Interactive Patient Education  2017 [employer]   We recommend that you schedule a mammogram for breast cancer screening. Typically, you do not need a referral to do this. Please contact a  local imaging center to schedule your mammogram.  [facility] - [phone]  *ask for the Radiology Department [facility] - [phone] or [phone]) [phone]  [facility] - [phone] [facility] - [phone] [facility] - [phone]  *ask for the Radiology Department [facility] - [phone]  *ask for the Radiology Department [facility] - [phone]  *ask for the Mammography Department [facility] - [phone]) [phone]   IF you received an x-ray today, you will receive an invoice from [facility]. Please contact [facility] at [phone] with questions or concerns regarding your invoice.   IF you received labwork today, you will receive an invoice from [facility]. Please contact [facility] at [phone] with questions or concerns regarding your invoice.   Our billing staff will not be able to assist you with questions regarding bills from these companies.  You will be contacted with the lab results as soon as they are available. The fastest way to get your results is to activate your My Chart account. Instructions are located on the last page of this paperwork. If you have not heard from us regarding the results in 2 weeks, please contact this office.

## 2016-07-18 NOTE — Progress Notes (Signed)
PRIMARY CARE AT [facility] [address], [place] [address] [phone]  Date:  07/18/2016   Name:  Sandra Mills   DOB:  January 14, 1961   MRN:  [id number]  PCP:  Porfirio Oar, PA-C    History of Present Illness:  Sandra Mills is a 56 y.o. female patient who presents to PCP with  Chief Complaint  Patient presents with  . frequent urination    lower back pain, depression scale during triage, score 7  . bloodwork    liver enzymes elevated     3 days of dysuria, frequency, and urgency.  No hematuria.  This morning, she was having significant nausea, however no emesis.  No fever.  No abnormal vaginal discharge.  She has not taken anything for your her symptoms.  Sexually active and unprotected with one partner.   Is also here for enzyme blood work from prior elevated liver enzyme.  This was pulled from prior order by Deliah Boston for follow up. Wt Readings from Last 3 Encounters:  05/05/16 183 lb (83 kg)  03/03/16 192 lb (87.1 kg)  12/14/15 201 lb 12.8 oz (91.5 kg)     Patient Active Problem List   Diagnosis Date Noted  . Need for prophylactic vaccination and inoculation against influenza 03/03/2016  . Anxiety and depression 06/23/2007  . TREMOR, ESSENTIAL 06/23/2007  . Migraine, menstrual 06/23/2007    Past Medical History:  Diagnosis Date  . Anxiety   . Colon polyps 12/09/12   three polyps; [place]; repeat in 3-5 years.  . IBS (irritable bowel syndrome)    diarrhea predominant; s/p GI consult with colonoscopy  . Migraine    menstrually related.  . Tremor, essential     Past Surgical History:  Procedure Laterality Date  . COLONOSCOPY W/ POLYPECTOMY  12/09/12   three polyps; [place]. Repeat in 3-5 years.  . Cyst resection perineal      Social History  Substance Use Topics  . Smoking status: Never Smoker  . Smokeless tobacco: Never Used  . Alcohol use No    Family History  Problem Relation Age of Onset  . Breast cancer Mother 86  . Cancer Mother 62   Breast cancer age 34  . Atrial fibrillation Father   . Atrial fibrillation Sister   . Breast cancer Other     maternal great aunt dx in her 55s  . Breast cancer Other     6 maternal great aunts dx in their 39s    No Known Allergies  Medication list has been reviewed and updated.  Current Outpatient Prescriptions on File Prior to Visit  Medication Sig Dispense Refill  . clonazePAM (KLONOPIN) 1 MG tablet TAKE 1/2-1 TABLET BY MOUTH TWICE A DAY AS NEEDED FOR ANXIETY 45 tablet 0  . rizatriptan (MAXALT-MLT) 10 MG disintegrating tablet Take 1 tablet (10 mg total) by mouth as needed for migraine. May repeat in 2 hours if needed 8 tablet 11  . venlafaxine XR (EFFEXOR-XR) 150 MG 24 hr capsule Take 1 capsule (150 mg total) by mouth daily with breakfast. 90 capsule 1  . cetirizine (ZYRTEC) 10 MG tablet TAKE 1 TABLET (10 MG TOTAL) BY MOUTH DAILY. 30 tablet 1   No current facility-administered medications on file prior to visit.     ROS ROS otherwise unremarkable unless listed above.  Physical Examination: There were no vitals taken for this visit. Ideal Body Weight:    Physical Exam  Constitutional: She is oriented to person, place, and time. She  appears well-developed and well-nourished. No distress.  HENT:  Head: Normocephalic and atraumatic.  Right Ear: External ear normal.  Left Ear: External ear normal.  Eyes: Conjunctivae and EOM are normal. Pupils are equal, round, and reactive to light.  Cardiovascular: Normal rate and regular rhythm.   Pulmonary/Chest: Effort normal. No respiratory distress.  Abdominal: Soft. Normal appearance and bowel sounds are normal. There is tenderness in the suprapubic area.  Musculoskeletal:  Right sided thoracic back pain upon palpation.  However negative cva.  Neurological: She is alert and oriented to person, place, and time.  Skin: She is not diaphoretic.  Psychiatric: She has a normal mood and affect. Her behavior is normal.    Results for  orders placed or performed in visit on 07/18/16  Urine culture  Result Value Ref Range   Urine Culture, Routine Preliminary report (A)    Urine Culture result 1 Escherichia coli (A)   Hepatic function panel  Result Value Ref Range   Total Protein 7.4 6.0 - 8.5 g/dL   Albumin 4.6 3.5 - 5.5 g/dL   Bilirubin Total 0.3 0.0 - 1.2 mg/dL   Bilirubin, Direct 0.450.08 0.00 - 0.40 mg/dL   Alkaline Phosphatase 90 39 - 117 IU/L   AST 37 0 - 40 IU/L   ALT 38 (H) 0 - 32 IU/L  POCT urinalysis dipstick  Result Value Ref Range   Color, UA yellow yellow   Clarity, UA cloudy (A) clear   Glucose, UA negative negative   Bilirubin, UA negative negative   Ketones, POC UA negative negative   Spec Grav, UA 1.015 1.030 - 1.035   Blood, UA moderate (A) negative   pH, UA 5.5 5.0 - 8.0   Protein Ur, POC =30 (A) negative   Urobilinogen, UA 0.2 Negative - 2.0   Nitrite, UA Negative Negative   Leukocytes, UA small (1+) (A) Negative  POCT Microscopic Urinalysis ([facility])  Result Value Ref Range   WBC,UR,HPF,POC Too numerous to count  (A) None WBC/hpf   RBC,UR,HPF,POC Too numerous to count  (A) None RBC/hpf   Bacteria Moderate (A) None, Too numerous to count   Mucus Absent Absent   Epithelial Cells, UR Per Microscopy Few (A) None, Too numerous to count cells/hpf     Assessment and Plan: Sandra Mills is a 56 y.o. female who is here today for cc of urinary sxs. Will treat for infection and cover for possible pyelo. Urine culture obtained today. Frequent urination - Plan: POCT urinalysis dipstick, POCT Microscopic Urinalysis ([facility])  Elevated LFTs - Plan: Hepatic function panel  Urinary tract infection without hematuria, site unspecified - Plan: sulfamethoxazole-trimethoprim (BACTRIM DS,SEPTRA DS) 800-160 MG tablet  Dysuria - Plan: Urine culture  Trena PlattStephanie Shaquera Ansley, PA-C [facility] 3/25/201810:19 AM

## 2016-07-19 LAB — HEPATIC FUNCTION PANEL
ALBUMIN: 4.6 g/dL (ref 3.5–5.5)
ALT: 38 IU/L — AB (ref 0–32)
AST: 37 IU/L (ref 0–40)
Alkaline Phosphatase: 90 IU/L (ref 39–117)
Bilirubin Total: 0.3 mg/dL (ref 0.0–1.2)
Bilirubin, Direct: 0.08 mg/dL (ref 0.00–0.40)
Total Protein: 7.4 g/dL (ref 6.0–8.5)

## 2016-07-20 LAB — URINE CULTURE

## 2016-07-24 ENCOUNTER — Other Ambulatory Visit: Payer: Self-pay | Admitting: Physician Assistant

## 2016-07-24 DIAGNOSIS — F329 Major depressive disorder, single episode, unspecified: Secondary | ICD-10-CM

## 2016-07-24 DIAGNOSIS — F32A Depression, unspecified: Secondary | ICD-10-CM

## 2016-07-24 DIAGNOSIS — F419 Anxiety disorder, unspecified: Principal | ICD-10-CM

## 2016-07-25 MED ORDER — CLONAZEPAM 1 MG PO TABS: 0.5000 mg | ORAL_TABLET | Freq: Two times a day (BID) | ORAL | 0 refills | Status: DC | PRN

## 2016-07-25 NOTE — Telephone Encounter (Signed)
06/03/16 last refill 

## 2016-07-25 NOTE — Telephone Encounter (Signed)
Called to [facility] 

## 2016-07-25 NOTE — Telephone Encounter (Signed)
Patient notified via My Chart.  Meds ordered this encounter  Medications  . clonazePAM (KLONOPIN) 1 MG tablet    Sig: Take 0.5-1 tablets (0.5-1 mg total) by mouth 2 (two) times daily as needed for anxiety.    Dispense:  45 tablet    Refill:  0    Not to exceed 5 additional fills before 10/14/2016.

## 2016-07-28 ENCOUNTER — Encounter: Payer: Self-pay | Admitting: Physician Assistant

## 2016-07-28 LAB — HM DIABETES EYE EXAM

## 2016-08-05 ENCOUNTER — Encounter: Payer: Self-pay | Admitting: Physician Assistant

## 2016-09-01 ENCOUNTER — Ambulatory Visit (INDEPENDENT_AMBULATORY_CARE_PROVIDER_SITE_OTHER): Payer: BC Managed Care – PPO | Admitting: Physician Assistant

## 2016-09-01 VITALS — BP 135/81 | HR 112 | Temp 98.3°F | Resp 16 | Wt 169.0 lb

## 2016-09-01 DIAGNOSIS — F419 Anxiety disorder, unspecified: Secondary | ICD-10-CM

## 2016-09-01 DIAGNOSIS — F329 Major depressive disorder, single episode, unspecified: Secondary | ICD-10-CM | POA: Diagnosis not present

## 2016-09-01 DIAGNOSIS — F32A Depression, unspecified: Secondary | ICD-10-CM

## 2016-09-01 MED ORDER — ESCITALOPRAM OXALATE 20 MG PO TABS: 20.0000 mg | ORAL_TABLET | Freq: Every day | ORAL | 1 refills | Status: DC

## 2016-09-01 NOTE — Progress Notes (Signed)
Patient ID: Sandra Mills, female    DOB: 05-Jun-1960, 56 y.o.   MRN: [id number]  PCP: Sandra Oar, PA-C  Chief Complaint  Patient presents with  . Anxiety    anxiety and depression  dad cva and uncle cva (died)    Subjective:   Presents for evaluation of anxiety and depression.  She had been thinking that she needed to make a change in her regimen for some months, finding herself more irritable, grumpy, "prickly" than usual, but generally feeling happy with her life. She had also developed a dry mouth and reduced libido associated with venlafaxine, causing irritation of the gums, tongue and lips. The oral symptoms were exacerbated by recent dental work (she is having a dental implant placed). Initially the reduced sex drive was not an issue, but she is in a new relationship, and the is interested in alternatives.  Then last week (on 08/28/2016) her father and his brother both had strokes ONT THE SAME DAY! Her father, 67, is in ICU in [place], [place]. She has spend most of the past week there with her step-mother. He apparently experienced a cerebral hemorrhage, and is expected to make some recovery, but may not be able to return home without assistance in addition to his wife. PT and OT will begin this week.   Her uncle, her father's younger brother, experienced a ruptured cerebral aneurysm and died yesterday after several days on life support.  She finds herself unable to control her emotions, and her tremor is much worse. She is crying, feeling overwhelmed. She has an appointment with me tomorrow for Wellness Exam, but didn't think she could wait. Has had some suicidal ideations, so wants me to take the pills she was given for her dental surgery, but denies any intention to harm herself. She also has plans to spend tonight with her boyfriend, rather than at home alone. The pills she gave me are Ibuprofen 800 mg tablets.  She has previously taken paroxetine and escitalopram. She  recalls that the escitalopram worked well for her initially, and she does not recall adverse effects but it seemed to loose it's effectiveness. The paroxetine caused hot flashes. Her son takes Wellbutrin with good benefit.    Review of Systems As above.    Patient Active Problem List   Diagnosis Date Noted  . Need for prophylactic vaccination and inoculation against influenza 03/03/2016  . Anxiety and depression 06/23/2007  . TREMOR, ESSENTIAL 06/23/2007  . Migraine, menstrual 06/23/2007     Prior to Admission medications   Medication Sig Start Date End Date Taking? Authorizing Provider  amoxicillin (AMOXIL) 500 MG capsule Take 500 mg by mouth 3 (three) times daily. For pre dental   Yes [provider]  clonazePAM (KLONOPIN) 1 MG tablet Take 0.5-1 tablets (0.5-1 mg total) by mouth 2 (two) times daily as needed for anxiety. 07/25/16  Yes Hortensia Duffin, PA-C  rizatriptan (MAXALT-MLT) 10 MG disintegrating tablet Take 1 tablet (10 mg total) by mouth as needed for migraine. May repeat in 2 hours if needed 12/14/15  Yes Leotis Shames, Chantel Teti, PA-C  venlafaxine XR (EFFEXOR-XR) 150 MG 24 hr capsule Take 1 capsule (150 mg total) by mouth daily with breakfast. 07/07/16  Yes Sherren Mocha, MD  cetirizine (ZYRTEC) 10 MG tablet TAKE 1 TABLET (10 MG TOTAL) BY MOUTH DAILY. 06/01/16 06/11/16  Ofilia Neas, PA-C     No Known Allergies     Objective:  Physical Exam  Constitutional: She is oriented  to person, place, and time. She appears well-developed and well-nourished. She is active and cooperative. No distress.  BP 135/81 (Cuff Size: Normal)   Pulse (!) 112   Temp 98.3 F (36.8 C) (Oral)   Resp 16   Wt 169 lb (76.7 kg)   SpO2 95%   BMI 29.01 kg/m   HENT:  Head: Normocephalic and atraumatic.  Right Ear: Hearing normal.  Left Ear: Hearing normal.  Eyes: Conjunctivae are normal. No scleral icterus.  Neck: Normal range of motion. Neck supple. No thyromegaly present.  Cardiovascular:  Normal rate, regular rhythm and normal heart sounds.   Pulses:      Radial pulses are 2+ on the right side, and 2+ on the left side.  Pulmonary/Chest: Effort normal and breath sounds normal.  Lymphadenopathy:       Head (right side): No tonsillar, no preauricular, no posterior auricular and no occipital adenopathy present.       Head (left side): No tonsillar, no preauricular, no posterior auricular and no occipital adenopathy present.    She has no cervical adenopathy.       Right: No supraclavicular adenopathy present.       Left: No supraclavicular adenopathy present.  Neurological: She is alert and oriented to person, place, and time. No sensory deficit.  Generalized tremulousness.  Skin: Skin is warm, dry and intact. No rash noted. No cyanosis or erythema. Nails show no clubbing.  Psychiatric: Her speech is normal and behavior is normal. Judgment normal. Her mood appears anxious. Her affect is labile. Her affect is not angry, not blunt and not inappropriate. Thought content is not paranoid and not delusional. Cognition and memory are normal. She exhibits a depressed mood. She expresses suicidal (no intention, no plan, but wants to be extra careful, so gave me her bottle of ibuprofen) ideation. She expresses no homicidal ideation. She expresses no suicidal plans and no homicidal plans.           Assessment & Plan:   Problem List Items Addressed This Visit    Anxiety and depression - Primary    Stop venlaxfaxine due to adverse effects. Resume escitalopram. If ineffective, would add Wellbutrin, then stop escitalopram to reduce initial increased irritability/anxiety often experienced when starting bupropion.      Relevant Medications   escitalopram (LEXAPRO) 20 MG tablet     Patient Instructions   1. Call your therapist to schedule a visit as soon as you can.  2. If you feel like you might harm yourself: -call your boyfriend -call your children -call 911 -drive yourself to  the emergency department or behavioral health for evaluation   Return for re-evaluation, TBD at tomorrow's visit.   Fernande Brashelle S. Janazia Schreier, PA-C Primary Care at [facility]

## 2016-09-01 NOTE — Patient Instructions (Addendum)
1. Call your therapist to schedule a visit as soon as you can.  2. If you feel like you might harm yourself: -call your boyfriend -call your children -call 911 -drive yourself to the emergency department or behavioral health for evaluation    IF you received an x-ray today, you will receive an invoice from [facility]. Please contact [facility] at [phone] with questions or concerns regarding your invoice.   IF you received labwork today, you will receive an invoice from [facility]. Please contact [facility] at [phone] with questions or concerns regarding your invoice.   Our billing staff will not be able to assist you with questions regarding bills from these companies.  You will be contacted with the lab results as soon as they are available. The fastest way to get your results is to activate your My Chart account. Instructions are located on the last page of this paperwork. If you have not heard from us regarding the results in 2 weeks, please contact this office.

## 2016-09-02 ENCOUNTER — Encounter: Payer: Self-pay | Admitting: Physician Assistant

## 2016-09-02 ENCOUNTER — Ambulatory Visit (INDEPENDENT_AMBULATORY_CARE_PROVIDER_SITE_OTHER): Payer: BC Managed Care – PPO | Admitting: Physician Assistant

## 2016-09-02 ENCOUNTER — Telehealth: Payer: Self-pay | Admitting: Physician Assistant

## 2016-09-02 VITALS — BP 112/76 | HR 96 | Temp 98.4°F | Resp 16 | Ht 63.0 in | Wt 170.2 lb

## 2016-09-02 DIAGNOSIS — Z1329 Encounter for screening for other suspected endocrine disorder: Secondary | ICD-10-CM

## 2016-09-02 DIAGNOSIS — Z130 Encounter for screening for diseases of the blood and blood-forming organs and certain disorders involving the immune mechanism: Secondary | ICD-10-CM

## 2016-09-02 DIAGNOSIS — Z1389 Encounter for screening for other disorder: Secondary | ICD-10-CM

## 2016-09-02 DIAGNOSIS — Z13220 Encounter for screening for lipoid disorders: Secondary | ICD-10-CM | POA: Diagnosis not present

## 2016-09-02 DIAGNOSIS — Z0000 Encounter for general adult medical examination without abnormal findings: Secondary | ICD-10-CM

## 2016-09-02 DIAGNOSIS — Z13228 Encounter for screening for other metabolic disorders: Secondary | ICD-10-CM | POA: Diagnosis not present

## 2016-09-02 DIAGNOSIS — Z1159 Encounter for screening for other viral diseases: Secondary | ICD-10-CM | POA: Diagnosis not present

## 2016-09-02 DIAGNOSIS — Z23 Encounter for immunization: Secondary | ICD-10-CM | POA: Diagnosis not present

## 2016-09-02 LAB — POCT URINALYSIS DIP (MANUAL ENTRY)
BILIRUBIN UA: NEGATIVE
Blood, UA: NEGATIVE
GLUCOSE UA: NEGATIVE mg/dL
Ketones, POC UA: NEGATIVE mg/dL
Leukocytes, UA: NEGATIVE
Nitrite, UA: NEGATIVE
Protein Ur, POC: NEGATIVE mg/dL
SPEC GRAV UA: 1.015 (ref 1.010–1.025)
UROBILINOGEN UA: 0.2 U/dL
pH, UA: 6 (ref 5.0–8.0)

## 2016-09-02 MED ORDER — ZOSTER VAC RECOMB ADJUVANTED 50 MCG/0.5ML IM SUSR: 0.5000 mL | Freq: Once | INTRAMUSCULAR | 1 refills | Status: AC

## 2016-09-02 NOTE — Progress Notes (Signed)
Patient ID: Sandra Mills, female    DOB: 1960-09-27, 56 y.o.   MRN: [id number]  PCP: Porfirio Oar, PA-C  Chief Complaint  Patient presents with  . Annual Exam    Pt states she will wait until Aug, for pap  . Depression    Pt refused depression screeining since she did it yesterday    Subjective:   Presents for Sandra Mills.  Cervical cancer screening due in August. Cytology and HPV both negative 11/2011. Overdue for mammogram. Due for Hep C screening. Colonoscopy 11/2012, normal. Tdap and flu vaccines are current.  I saw her yesterday for acute stress reaction/depression/anxiety. She was started back on SSRI, previously effective, and spent the night with her boyfriend last night. Plans to again tonight. No suicidal thoughts. Tremulousness is much better, only minimally above baseline.   Patient Active Problem List   Diagnosis Date Noted  . Anxiety and depression 06/23/2007  . TREMOR, ESSENTIAL 06/23/2007  . Migraine, menstrual 06/23/2007    Past Medical History:  Diagnosis Date  . Anxiety   . Colon polyps 12/09/12   three polyps; Sandra Mills; repeat in 3-5 years.  . IBS (irritable bowel syndrome)    diarrhea predominant; s/p GI consult with colonoscopy  . Migraine    menstrually related.  . Tremor, essential      Prior to Admission medications   Medication Sig Start Date End Date Taking? Authorizing Provider  amoxicillin (AMOXIL) 500 MG capsule TAKE 2 CAPSULES TO START.THEN, TAKE ONE CAPSULE EVERY 8 HOURS UNTIL GONE 08/26/16  Yes [provider]  clonazePAM (KLONOPIN) 1 MG tablet Take 0.5-1 tablets (0.5-1 mg total) by mouth 2 (two) times daily as needed for anxiety. 07/25/16  Yes Nykole Matos, PA-C  escitalopram (LEXAPRO) 20 MG tablet Take 1 tablet (20 mg total) by mouth daily. 09/01/16  Yes Marsella Suman, PA-C  cetirizine (ZYRTEC) 10 MG tablet TAKE 1 TABLET (10 MG TOTAL) BY MOUTH DAILY. 06/01/16 06/11/16  Ofilia Neas, PA-C  rizatriptan  (MAXALT-MLT) 10 MG disintegrating tablet Take 1 tablet (10 mg total) by mouth as needed for migraine. May repeat in 2 hours if needed Patient not taking: Reported on 09/02/2016 12/14/15   Porfirio Oar, PA-C    No Known Allergies  Past Surgical History:  Procedure Laterality Date  . COLONOSCOPY W/ POLYPECTOMY  12/09/12   three polyps; [place]. Repeat in 3-5 years.  . Cyst resection perineal      Family History  Problem Relation Age of Onset  . Breast cancer Mother 54  . Cancer Mother 61       Breast cancer age 88  . Atrial fibrillation Father   . Atrial fibrillation Sister   . Breast cancer Other        maternal great aunt dx in her 44s  . Breast cancer Other        6 maternal great aunts dx in their 71s    Social History   Social History  . Marital status: Legally Separated    Spouse name: 11/2015  . Number of children: 4  . Years of education: Master's   Occupational History  . 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 Smoking status: Never Smoker  . Smokeless tobacco: Never Used  . Alcohol use No  . Drug use: No  . Sexual activity: Yes    Birth control/ protection: Surgical     Comment: Husband vasectomy   Other Topics Concern  .  None   Social History Narrative   Marital status:  Married x 31 years, no abuse. Separated 11/2015. Dating.      Lives: Lives alone, children (1993, 1995, 1997, 1999) live with their father and visit her regulalry.  No grandchildren.      Children: 4 children; no grandchildren.      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  Previously worked [employer].   No tobacco/alcohol/drugs; wine three times per year.   Exercise sporadic; curves and [employer].   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         Review of Systems  Constitutional: Negative.   HENT: Negative.   Eyes: Negative.   Respiratory: Negative.   Cardiovascular: Negative.   Gastrointestinal: Negative.   Endocrine: Negative.     Genitourinary: Negative.   Musculoskeletal: Negative.   Skin: Negative.   Allergic/Immunologic: Negative.   Neurological: Negative.   Hematological: Negative.   Psychiatric/Behavioral: Positive for dysphoric mood. Negative for agitation, behavioral problems, confusion, decreased concentration, hallucinations, self-injury, sleep disturbance and suicidal ideas. The patient is nervous/anxious. The patient is not hyperactive.         Objective:  Physical Exam  Constitutional: She is oriented to person, place, and time. Vital signs are normal. She appears well-developed and well-nourished. She is active and cooperative. No distress.  BP 112/76   Pulse 96   Temp 98.4 F (36.9 C) (Oral)   Resp 16   Ht 5\' 3"  (1.6 m)   Wt 170 lb 3.2 oz (77.2 kg)   SpO2 96%   BMI 30.15 kg/m    HENT:  Head: Normocephalic and atraumatic.  Right Ear: Hearing, tympanic membrane, external ear and ear canal normal. No foreign bodies.  Left Ear: Hearing, tympanic membrane, external ear and ear canal normal. No foreign bodies.  Nose: Nose normal.  Mouth/Throat: Uvula is midline, oropharynx is clear and moist and mucous membranes are normal. No oral lesions. Normal dentition. No dental abscesses or uvula swelling. No oropharyngeal exudate.  Eyes: Conjunctivae, EOM and lids are normal. Pupils are equal, round, and reactive to light. Right eye exhibits no discharge. Left eye exhibits no discharge. No scleral icterus.  Fundoscopic exam:      The right eye shows no arteriolar narrowing, no AV nicking, no exudate, no hemorrhage and no papilledema. The right eye shows red reflex.       The left eye shows no arteriolar narrowing, no AV nicking, no exudate, no hemorrhage and no papilledema. The left eye shows red reflex.  Neck: Trachea normal, normal range of motion and full passive range of motion without pain. Neck supple. No spinous process tenderness and no muscular tenderness present. No thyroid mass and no  thyromegaly present.  Cardiovascular: Normal rate, regular rhythm, normal heart sounds, intact distal pulses and normal pulses.   Pulmonary/Chest: Effort normal and breath sounds normal.  Musculoskeletal: She exhibits no edema or tenderness.       Cervical back: Normal.       Thoracic back: Normal.       Lumbar back: Normal.  Lymphadenopathy:       Head (right side): No tonsillar, no preauricular, no posterior auricular and no occipital adenopathy present.       Head (left side): No tonsillar, no preauricular, no posterior auricular and no occipital adenopathy present.    She has no cervical adenopathy.       Right: No supraclavicular adenopathy present.       Left: No  supraclavicular adenopathy present.  Neurological: She is alert and oriented to person, place, and time. She has normal strength and normal reflexes. No cranial nerve deficit. She exhibits normal muscle tone. Coordination and gait normal.  Mild tremor of the head  Skin: Skin is warm, dry and intact. No rash noted. She is not diaphoretic. No cyanosis or erythema. Nails show no clubbing.  Psychiatric: She has a normal mood and affect. Her speech is normal and behavior is normal. Judgment and thought content normal.           Assessment & Plan:  1. Annual physical exam Age appropriate anticipatory guidance provided.  2. Encounter for hepatitis C screening test for low risk patient - Hepatitis C antibody  3. Need for shingles vaccine - Zoster Vac Recomb Adjuvanted [facility]) injection; Inject 0.5 mLs into the muscle once.  Dispense: 1 each; Refill: 1  4. Screening for deficiency anemia - CBC with Differential/Platelet  5. Screening for metabolic disorder - Comprehensive metabolic panel  6. Screening for hyperlipidemia - Lipid panel  7. Screening for thyroid disorder - TSH - T4, free  8. Screening for blood or protein in urine - POCT urinalysis dipstick    Return in about 4 weeks (around  09/30/2016).   Fernande Bras, PA-C Primary Care at [facility]

## 2016-09-02 NOTE — Patient Instructions (Addendum)
     IF you received an x-ray today, you will receive an invoice from [facility]. Please contact [facility] at [phone] with questions or concerns regarding your invoice.   IF you received labwork today, you will receive an invoice from [facility]. Please contact [facility] at [phone] with questions or concerns regarding your invoice.   Our billing staff will not be able to assist you with questions regarding bills from these companies.  You will be contacted with the lab results as soon as they are available. The fastest way to get your results is to activate your My Chart account. Instructions are located on the last page of this paperwork. If you have not heard from us regarding the results in 2 weeks, please contact this office.    We recommend that you schedule a mammogram for breast cancer screening. Typically, you do not need a referral to do this. Please contact a local imaging center to schedule your mammogram.  [facility] - [phone]  *ask for the Radiology Department [facility] - [phone] or [phone]  [facility] - [phone] [facility] - [phone] [facility] - [phone]  *ask for the Radiology Department [facility] - [phone]  *ask for the Radiology Department [facility] - [phone]  *ask for the Mammography Department [facility] - [phone] 

## 2016-09-03 NOTE — Assessment & Plan Note (Signed)
Stop venlaxfaxine due to adverse effects. Resume escitalopram. If ineffective, would add Wellbutrin, then stop escitalopram to reduce initial increased irritability/anxiety often experienced when starting bupropion.

## 2016-09-03 NOTE — Telephone Encounter (Signed)
Erroneous encounter

## 2016-09-26 ENCOUNTER — Emergency Department (HOSPITAL_COMMUNITY)
Admission: EM | Admit: 2016-09-26 | Discharge: 2016-09-26 | Disposition: A | Discharge status: Other Acute Inpatient Hospital | Payer: BC Managed Care – PPO | Attending: Emergency Medicine | Admitting: Emergency Medicine

## 2016-09-26 ENCOUNTER — Encounter (HOSPITAL_COMMUNITY): Payer: Self-pay

## 2016-09-26 ENCOUNTER — Inpatient Hospital Stay (HOSPITAL_COMMUNITY)
Admission: EM | Admit: 2016-09-26 | Discharge: 2016-09-30 | DRG: 885 | Disposition: A | Discharge status: Home / Self Care | Payer: BC Managed Care – PPO | Source: Intra-hospital | Attending: Psychiatry | Admitting: Psychiatry

## 2016-09-26 DIAGNOSIS — K589 Irritable bowel syndrome without diarrhea: Secondary | ICD-10-CM | POA: Diagnosis present

## 2016-09-26 DIAGNOSIS — G4700 Insomnia, unspecified: Secondary | ICD-10-CM | POA: Diagnosis present

## 2016-09-26 DIAGNOSIS — Z86010 Personal history of colonic polyps: Secondary | ICD-10-CM | POA: Diagnosis not present

## 2016-09-26 DIAGNOSIS — F419 Anxiety disorder, unspecified: Secondary | ICD-10-CM | POA: Diagnosis present

## 2016-09-26 DIAGNOSIS — R45851 Suicidal ideations: Secondary | ICD-10-CM | POA: Insufficient documentation

## 2016-09-26 DIAGNOSIS — Z79899 Other long term (current) drug therapy: Secondary | ICD-10-CM | POA: Diagnosis not present

## 2016-09-26 DIAGNOSIS — F329 Major depressive disorder, single episode, unspecified: Secondary | ICD-10-CM | POA: Diagnosis present

## 2016-09-26 DIAGNOSIS — F411 Generalized anxiety disorder: Secondary | ICD-10-CM

## 2016-09-26 DIAGNOSIS — G250 Essential tremor: Secondary | ICD-10-CM | POA: Diagnosis present

## 2016-09-26 DIAGNOSIS — G8191 Hemiplegia, unspecified affecting right dominant side: Secondary | ICD-10-CM | POA: Diagnosis present

## 2016-09-26 DIAGNOSIS — F322 Major depressive disorder, single episode, severe without psychotic features: Principal | ICD-10-CM

## 2016-09-26 DIAGNOSIS — Z818 Family history of other mental and behavioral disorders: Secondary | ICD-10-CM

## 2016-09-26 DIAGNOSIS — F3289 Other specified depressive episodes: Secondary | ICD-10-CM

## 2016-09-26 LAB — COMPREHENSIVE METABOLIC PANEL
ALK PHOS: 77 U/L (ref 38–126)
ALT: 21 U/L (ref 14–54)
AST: 20 U/L (ref 15–41)
Albumin: 4.4 g/dL (ref 3.5–5.0)
Anion gap: 8 (ref 5–15)
BUN: 10 mg/dL (ref 6–20)
CALCIUM: 9.1 mg/dL (ref 8.9–10.3)
CO2: 25 mmol/L (ref 22–32)
CREATININE: 0.75 mg/dL (ref 0.44–1.00)
Chloride: 104 mmol/L (ref 101–111)
GFR calc Af Amer: >60 mL/min (ref >60)
Glucose, Bld: 105 mg/dL — ABNORMAL HIGH (ref 65–99)
Potassium: 3.6 mmol/L (ref 3.5–5.1)
Sodium: 137 mmol/L (ref 135–145)
Total Bilirubin: 1 mg/dL (ref 0.3–1.2)
Total Protein: 8.3 g/dL — ABNORMAL HIGH (ref 6.5–8.1)

## 2016-09-26 LAB — CBC
HCT: 39.1 % (ref 36.0–46.0)
HEMOGLOBIN: 13.3 g/dL (ref 12.0–15.0)
MCH: 30.1 pg (ref 26.0–34.0)
MCHC: 34 g/dL (ref 30.0–36.0)
MCV: 88.5 fL (ref 78.0–100.0)
PLATELETS: 275 10*3/uL (ref 150–400)
RBC: 4.42 MIL/uL (ref 3.87–5.11)
RDW: 14.4 % (ref 11.5–15.5)
WBC: 8.7 10*3/uL (ref 4.0–10.5)

## 2016-09-26 LAB — RAPID URINE DRUG SCREEN, HOSP PERFORMED
AMPHETAMINES: NOT DETECTED
Barbiturates: NOT DETECTED
Benzodiazepines: NOT DETECTED
Cocaine: NOT DETECTED
Opiates: NOT DETECTED
TETRAHYDROCANNABINOL: NOT DETECTED

## 2016-09-26 LAB — SALICYLATE LEVEL: Salicylate Lvl: <7 mg/dL (ref 2.8–30.0)

## 2016-09-26 LAB — ACETAMINOPHEN LEVEL: Acetaminophen (Tylenol), Serum: <10 ug/mL — ABNORMAL LOW (ref 10–30)

## 2016-09-26 LAB — ETHANOL

## 2016-09-26 MED ORDER — ACETAMINOPHEN 325 MG PO TABS
650.0000 mg | ORAL_TABLET | Freq: Four times a day (QID) | ORAL | Status: DC | PRN
  Administered 2016-09-26: 650 mg via ORAL
  Filled 2016-09-26: qty 2

## 2016-09-26 MED ORDER — ONDANSETRON HCL 4 MG PO TABS: 4.0000 mg | ORAL_TABLET | Freq: Four times a day (QID) | ORAL | Status: DC | PRN

## 2016-09-26 MED ORDER — HYDROXYZINE HCL 25 MG PO TABS
ORAL_TABLET | ORAL | Status: AC
  Filled 2016-09-26: qty 1

## 2016-09-26 MED ORDER — ALUM & MAG HYDROXIDE-SIMETH 200-200-20 MG/5ML PO SUSP: 30.0000 mL | ORAL | Status: DC | PRN

## 2016-09-26 MED ORDER — HYDROXYZINE HCL 25 MG PO TABS
25.0000 mg | ORAL_TABLET | Freq: Four times a day (QID) | ORAL | Status: DC | PRN
  Administered 2016-09-26 – 2016-09-29 (×5): 25 mg via ORAL
  Filled 2016-09-26 (×4): qty 1

## 2016-09-26 MED ORDER — ONDANSETRON 4 MG PO TBDP
ORAL_TABLET | ORAL | Status: AC
  Administered 2016-09-26: 4 mg
  Filled 2016-09-26: qty 1

## 2016-09-26 MED ORDER — ACETAMINOPHEN 325 MG PO TABS
650.0000 mg | ORAL_TABLET | Freq: Once | ORAL | Status: AC
  Administered 2016-09-26: 650 mg via ORAL
  Filled 2016-09-26: qty 2

## 2016-09-26 MED ORDER — MAGNESIUM HYDROXIDE 400 MG/5ML PO SUSP: 30.0000 mL | Freq: Every day | ORAL | Status: DC | PRN

## 2016-09-26 MED ORDER — GABAPENTIN 100 MG PO CAPS
100.0000 mg | ORAL_CAPSULE | Freq: Two times a day (BID) | ORAL | Status: DC
  Administered 2016-09-26 – 2016-09-29 (×6): 100 mg via ORAL
  Filled 2016-09-26 (×10): qty 1

## 2016-09-26 NOTE — ED Notes (Signed)
Bed: WTR5 Expected date:  Expected time:  Means of arrival:  Comments: 

## 2016-09-26 NOTE — Progress Notes (Signed)
  DATA ACTION RESPONSE  Objective- Pt. is visible in the room, seen resting in bed with eyes open. Presents with an anxious/apprehensive affect and mood. Appears tired with racing thoughts. No abnormal s/s.  Subjective- Denies having any HI/AVH/Pain at this time. Passive SI but verbally contracts for safety. Pt. states " I am okay, I just have all these thoughts".      Is cooperative and remain safe on the unit.  1:1 interaction in private to establish rapport. Encouragement, education, & support given from staff.  PRN Vistaril requested and will re-eval accordingly.   Safety maintained with Q 15 checks. Continue with POC.

## 2016-09-26 NOTE — [facility] Suicide Risk Assessment (Signed)
[facility] Admission Suicide Risk Assessment   Nursing information obtained from:  Patient Demographic factors:  Divorced or widowed, Living alone Current Mental Status:  Self-harm thoughts Loss Factors:  Loss of significant relationship, Legal issues, Financial problems / change in socioeconomic status Historical Factors:  Family history of mental illness or substance abuse Risk Reduction Factors:  Sense of responsibility to family, Employed, Positive social support  Total Time spent with patient: 1 hour Principal Problem: Severe major depression without psychotic features (HCC) Diagnosis:   Patient Active Problem List   Diagnosis Date Noted  . Severe major depression without psychotic features (HCC) [F32.2] 09/26/2016  . Anxiety and depression [F41.9, F32.9] 06/23/2007  . TREMOR, ESSENTIAL [G25.0, G25.2] 06/23/2007  . Migraine, menstrual [G43.829] 06/23/2007   Subjective Data: 56 years old female separated from her husband and almost divorced admitted from [facility] emergency department for increased symptoms of depression and suicidal ideation. Patient has multiple psychosocial stresses and family health problems and personal relationship problems stated he could not handle any longer.  Continued Clinical Symptoms:  Alcohol Use Disorder Identification Test Final Score (AUDIT): 0 The "Alcohol Use Disorders Identification Test", Guidelines for Use in Primary Care, Second Edition.  World Science writer [facility]). Score between 0-7:  no or low risk or alcohol related problems. Score between 8-15:  moderate risk of alcohol related problems. Score between 16-19:  high risk of alcohol related problems. Score 20 or above:  warrants further diagnostic evaluation for alcohol dependence and treatment.   CLINICAL FACTORS:   Severe Anxiety and/or Agitation Depression:   Anhedonia Hopelessness Impulsivity Insomnia Recent sense of peace/wellbeing Severe More than one psychiatric  diagnosis Unstable or Poor Therapeutic Relationship Previous Psychiatric Diagnoses and Treatments   Musculoskeletal: Strength & Muscle Tone: within normal limits Gait & Station: normal Patient leans: N/A  Psychiatric Specialty Exam: Physical Exam  ROS  Blood pressure 125/82, pulse 74, temperature 99.1 F (37.3 C), temperature source Oral, resp. rate 18, height 5\' 3"  (1.6 m), weight 75.3 kg (166 lb), SpO2 100 %.Body mass index is 29.41 kg/m.  General Appearance: Guarded  Eye Contact:  Good  Speech:  Clear and Coherent  Volume:  Decreased  Mood:  Anxious, Dysphoric, Hopeless and Worthless  Affect:  Depressed and Tearful  Thought Process:  Coherent and Goal Directed  Orientation:  Full (Time, Place, and Person)  Thought Content:  Rumination  Suicidal Thoughts:  Yes.  with intent/plan  Homicidal Thoughts:  No  Memory:  Immediate;   Good Recent;   Fair Remote;   Fair  Judgement:  Fair  Insight:  Fair  Psychomotor Activity:  Decreased  Concentration:  Concentration: Fair and Attention Span: Fair  Recall:  Good  Fund of Knowledge:  Good  Language:  Good  Akathisia:  Negative  Handed:  Right  AIMS (if indicated):     Assets:  Communication Skills Desire for Improvement Housing Leisure Time Physical Health Resilience Social Support Talents/Skills Transportation Vocational/Educational  ADL's:  Intact  Cognition:  WNL  Sleep:         COGNITIVE FEATURES THAT CONTRIBUTE TO RISK:  Closed-mindedness, Loss of executive function, Polarized thinking and Thought constriction (tunnel vision)    SUICIDE RISK:   Moderate:  Frequent suicidal ideation with limited intensity, and duration, some specificity in terms of plans, no associated intent, good self-control, limited dysphoria/symptomatology, some risk factors present, and identifiable protective factors, including available and accessible social support.  PLAN OF CARE: Admit voluntarily and emergently from [facility]  emergency  department for increased symptoms of depression, anxiety and suicidal ideation with plan. Patient meet criteria for inpatient psychiatric hospitalization for crisis stabilization, safety monitoring on medication management.  I certify that inpatient services furnished can reasonably be expected to improve the patient's condition.   Leata MouseJANARDHANA Jaylean Buenaventura, MD 09/26/2016, 11:25 AM
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
## 2016-09-26 NOTE — Plan of Care (Signed)
Problem: Safety: Goal: Periods of time without injury will increase Outcome: Progressing Pt. remains a low fall risk, denies HI/AVH, endorses passive SI but verbally contracts for safety, Q 15 checks in effect.

## 2016-09-26 NOTE — Progress Notes (Signed)
Data. Patient denies HI/AVH.  She does endorse passive SI, "But I can't do anything in here." verbally contracts to come to staff if SI come to the point where she may act on it.Patient has been very tearful this shift and she went into detail about the stressors in her life currently. She was able to  interact well with staff and other patients after becoming acclimated to the unit..  Action. Emotional support and encouragement offered. Education provided on medication, indications and side effect. Q 15 minute checks done for safety. Response. Safety on the unit maintained through 15 minute checks.  Medications taken as prescribed. Attended groups. Remained appropriate through out shift.
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
## 2016-09-26 NOTE — ED Provider Notes (Signed)
[facility]-EMERGENCY DEPT Provider Note   CSN: [id number] Arrival date & time: 09/26/16  0030     History   Chief Complaint Chief Complaint  Patient presents with  . Suicidal    HPI Sandra Mills is a 56 y.o. female.  Sandra Mills is a 56 y.o. Female with a history of depression who presents the emergency department complaining of suicidal ideations. Patient reports she's been struggling with depression for several months now. She reports having intermittent suicidal ideations recently that have worsened today. She reports thinking about taking pills and overdose or swerving her car into traffic. She gave her physician her medications so that she did not overdose on them. She denies taking any medications and overdose today. She denies homicidal ideations. No previous admissions for mental illness. She denies visual or auditory hallucinations. She denies physical complaints.   The history is provided by the patient and medical records. No language interpreter was used.    Past Medical History:  Diagnosis Date  . Anxiety   . Colon polyps 12/09/12   three polyps; [facility]; repeat in 3-5 years.  . IBS (irritable bowel syndrome)    diarrhea predominant; s/p GI consult with colonoscopy  . Migraine    menstrually related.  . Tremor, essential     Patient Active Problem List   Diagnosis Date Noted  . Severe major depression without psychotic features (HCC) 09/26/2016  . Anxiety and depression 06/23/2007  . TREMOR, ESSENTIAL 06/23/2007  . Migraine, menstrual 06/23/2007    Past Surgical History:  Procedure Laterality Date  . COLONOSCOPY W/ POLYPECTOMY  12/09/12   three polyps; [facility]. Repeat in 3-5 years.  . Cyst resection perineal      OB History    No data available       Home Medications    Prior to Admission medications   Medication Sig Start Date End Date Taking? Authorizing Provider  amoxicillin (AMOXIL) 500 MG capsule TAKE 2 CAPSULES TO START.THEN, TAKE ONE  CAPSULE EVERY 8 HOURS UNTIL GONE 08/26/16   [provider]  cetirizine (ZYRTEC) 10 MG tablet TAKE 1 TABLET (10 MG TOTAL) BY MOUTH DAILY. 06/01/16 06/11/16  Ofilia Neas, PA-C  clonazePAM (KLONOPIN) 1 MG tablet Take 0.5-1 tablets (0.5-1 mg total) by mouth 2 (two) times daily as needed for anxiety. 07/25/16   Jeffery, Chelle, PA-C  escitalopram (LEXAPRO) 20 MG tablet Take 1 tablet (20 mg total) by mouth daily. 09/01/16   Porfirio Oar, PA-C  rizatriptan (MAXALT-MLT) 10 MG disintegrating tablet Take 1 tablet (10 mg total) by mouth as needed for migraine. May repeat in 2 hours if needed Patient not taking: Reported on 09/02/2016 12/14/15   Porfirio Oar, PA-C    Family History Family History  Problem Relation Age of Onset  . Breast cancer Mother 35  . Cancer Mother 32       Breast cancer age 33  . Atrial fibrillation Father   . Atrial fibrillation Sister   . Breast cancer Other        maternal great aunt dx in her 24s  . Breast cancer Other        6 maternal great aunts dx in their 1s    Social History Social History  Substance Use Topics  . Smoking status: Never Smoker  . Smokeless tobacco: Never Used  . Alcohol use No     Allergies   Patient has no known allergies.   Review of Systems Review of Systems  Constitutional: Negative for chills  and fever.  HENT: Negative for congestion and sore throat.   Eyes: Negative for visual disturbance.  Respiratory: Negative for cough and shortness of breath.   Cardiovascular: Negative for chest pain.  Gastrointestinal: Negative for abdominal pain, nausea and vomiting.  Genitourinary: Negative for dysuria.  Musculoskeletal: Negative for back pain.  Skin: Negative for rash.  Neurological: Negative for headaches.  Psychiatric/Behavioral: Positive for dysphoric mood and suicidal ideas. Negative for hallucinations and self-injury.     Physical Exam Updated Vital Signs BP 113/71 (BP Location: Right Arm)   Pulse 75   Temp 97.8  F (36.6 C) (Oral)   Resp 19   SpO2 99%   Physical Exam  Constitutional: She is oriented to person, place, and time. She appears well-developed and well-nourished. No distress.  HENT:  Head: Normocephalic and atraumatic.  Eyes: Conjunctivae are normal. Pupils are equal, round, and reactive to light. Right eye exhibits no discharge. Left eye exhibits no discharge.  Neck: Neck supple.  Cardiovascular: Normal rate, regular rhythm, normal heart sounds and intact distal pulses.   Pulmonary/Chest: Effort normal and breath sounds normal. No respiratory distress.  Abdominal: Soft. There is no tenderness.  Musculoskeletal: She exhibits no edema or tenderness.  Lymphadenopathy:    She has no cervical adenopathy.  Neurological: She is alert and oriented to person, place, and time. Coordination normal.  Skin: Skin is warm and dry. No rash noted. She is not diaphoretic.  Psychiatric: Her speech is normal. She is not actively hallucinating. Thought content is not paranoid. She exhibits a depressed mood. She expresses suicidal ideation. She expresses no homicidal ideation. She expresses suicidal plans.  Patient is tearful during interview. She appears depressed. She endorses suicidal ideations with a plan to overdose on her medications or swerve her car into traffic. She denies homicidal ideations. She is voluntary.  Nursing note and vitals reviewed.    ED Treatments / Results  Labs (all labs ordered are listed, but only abnormal results are displayed) Labs Reviewed  COMPREHENSIVE METABOLIC PANEL - Abnormal; Notable for the following:       Result Value   Glucose, Bld 105 (*)    Total Protein 8.3 (*)    All other components within normal limits  ACETAMINOPHEN LEVEL - Abnormal; Notable for the following:    Acetaminophen (Tylenol), Serum <10 (*)    All other components within normal limits  ETHANOL  SALICYLATE LEVEL  CBC  RAPID URINE DRUG SCREEN, HOSP PERFORMED    EKG  EKG  Interpretation None       Radiology No results found.  Procedures Procedures (including critical care time)  Medications Ordered in ED Medications  acetaminophen (TYLENOL) tablet 650 mg (650 mg Oral Given 09/26/16 0357)     Initial Impression / Assessment and Plan / ED Course  I have reviewed the triage vital signs and the nursing notes.  Pertinent labs & imaging results that were available during my care of the patient were reviewed by me and considered in my medical decision making (see chart for details).    Patient here with suicidal ideations and depression. She is voluntary. Blood work appears unremarkable. She is medically clear for Behavioral Health disposition. Behavioral Health recommends inpatient treatment. She has a bed available at [facility]. Will transfer to behavioral health Hospital.  Final Clinical Impressions(s) / ED Diagnoses   Final diagnoses:  Suicidal ideation  Other depression    New Prescriptions Discharge Medication List as of 09/26/2016  4:55 AM  Everlene Farrier, PA-C 09/26/16 3220    Dione Booze, MD 09/26/16 0630

## 2016-09-26 NOTE — ED Notes (Signed)
TTS at bedside. 

## 2016-09-26 NOTE — [facility] Group Notes (Signed)
[facility] LCSW Group Therapy 09/26/2016 1:15pm  Type of Therapy: Group Therapy- Feelings Around Relapse and Recovery  Participation Level: Pt invited. Did not attend.     Jonathon JordanLynn B Bob Eastwood, MSW, Theresia MajorsLCSWA [phone] 09/26/2016 4:06 PM

## 2016-09-26 NOTE — Tx Team (Signed)
Interdisciplinary Treatment and Diagnostic Plan Update 09/26/2016 Time of Session: 9:30am  Sandra Mills  MRN: [id number]  Principal Diagnosis: Severe major depression without psychotic features [facility])  Secondary Diagnoses: Principal Problem:   Severe major depression without psychotic features (Sandra Mills)   Current Medications:  Current Facility-Administered Medications  Medication Dose Route Frequency Provider Last Rate Last Dose  . hydrOXYzine (ATARAX/VISTARIL) 25 MG tablet           . ondansetron (ZOFRAN-ODT) 4 MG disintegrating tablet           . acetaminophen (TYLENOL) tablet 650 mg  650 mg Oral Q6H PRN Lindon Romp A, NP   650 mg at 09/26/16 1341  . alum & mag hydroxide-simeth (MAALOX/MYLANTA) 200-200-20 MG/5ML suspension 30 mL  30 mL Oral Q4H PRN Lindon Romp A, NP      . gabapentin (NEURONTIN) capsule 100 mg  100 mg Oral BID Ambrose Finland, MD      . hydrOXYzine (ATARAX/VISTARIL) tablet 25 mg  25 mg Oral Q6H PRN Aundra Dubin, MD      . magnesium hydroxide (MILK OF MAGNESIA) suspension 30 mL  30 mL Oral Daily PRN Rozetta Nunnery, NP      . ondansetron (ZOFRAN) tablet 4 mg  4 mg Oral Q6H PRN Aundra Dubin, MD        PTA Medications: Prescriptions Prior to Admission  Medication Sig Dispense Refill Last Dose  . clonazePAM (KLONOPIN) 1 MG tablet Take 0.5-1 tablets (0.5-1 mg total) by mouth 2 (two) times daily as needed for anxiety. (Patient taking differently: Take 0.5 mg by mouth 2 (two) times daily as needed for anxiety. ) 45 tablet 0 Past Week at Unknown time  . escitalopram (LEXAPRO) 20 MG tablet Take 1 tablet (20 mg total) by mouth daily. 30 tablet 1 Past Week at Unknown time  . rizatriptan (MAXALT-MLT) 10 MG disintegrating tablet Take 1 tablet (10 mg total) by mouth as needed for migraine. May repeat in 2 hours if needed 8 tablet 11 Past Month at Unknown time    Treatment Modalities: Medication Management, Group therapy, Case management,  1 to 1  session with clinician, Psychoeducation, Recreational therapy.  Patient Stressors: Arts development officer issue Marital or family conflict Patient Strengths: Ability for insight Active sense of humor Average or above average intelligence Capable of independent living Communication skills  Physician Treatment Plan for Primary Diagnosis: Severe major depression without psychotic features (Sandra Mills) Long Term Goal(s): Improvement in symptoms so as ready for discharge Short Term Goals: Ability to identify changes in lifestyle to reduce recurrence of condition will improve Ability to verbalize feelings will improve Ability to disclose and discuss suicidal ideas Ability to demonstrate self-control will improve Ability to identify and develop effective coping behaviors will improve Ability to maintain clinical measurements within normal limits will improve Compliance with prescribed medications will improve Ability to identify triggers associated with substance abuse/mental health issues will improve  Medication Management: Evaluate patient's response, side effects, and tolerance of medication regimen.  Therapeutic Interventions: 1 to 1 sessions, Unit Group sessions and Medication administration.  Evaluation of Outcomes: Not Met  Physician Treatment Plan for Secondary Diagnosis: Principal Problem:   Severe major depression without psychotic features (Sandra Mills)  Long Term Goal(s): Improvement in symptoms so as ready for discharge  Short Term Goals: Ability to identify changes in lifestyle to reduce recurrence of condition will improve Ability to verbalize feelings will improve Ability to disclose and discuss suicidal ideas Ability to demonstrate self-control will improve  Ability to identify and develop effective coping behaviors will improve Ability to maintain clinical measurements within normal limits will improve Compliance with prescribed medications will improve Ability to identify  triggers associated with substance abuse/mental health issues will improve  Medication Management: Evaluate patient's response, side effects, and tolerance of medication regimen.  Therapeutic Interventions: 1 to 1 sessions, Unit Group sessions and Medication administration.  Evaluation of Outcomes: Not Met  RN Treatment Plan for Primary Diagnosis: Severe major depression without psychotic features (Sandra Mills) Long Term Goal(s): Knowledge of disease and therapeutic regimen to maintain health will improve  Short Term Goals: Compliance with prescribed medications will improve  Medication Management: RN will administer medications as ordered by provider, will assess and evaluate patient's response and provide education to patient for prescribed medication. RN will report any adverse and/or side effects to prescribing provider.  Therapeutic Interventions: 1 on 1 counseling sessions, Psychoeducation, Medication administration, Evaluate responses to treatment, Monitor vital signs and CBGs as ordered, Perform/monitor CIWA, COWS, AIMS and Fall Risk screenings as ordered, Perform wound care treatments as ordered.  Evaluation of Outcomes: Not Met  LCSW Treatment Plan for Primary Diagnosis: Severe major depression without psychotic features (Sandra Mills) Long Term Goal(s): Safe transition to appropriate next level of care at discharge, Engage patient in therapeutic group addressing interpersonal concerns. Short Term Goals: Engage patient in aftercare planning with referrals and resources, Increase ability to appropriately verbalize feelings, Identify triggers associated with mental health/substance abuse issues and Increase skills for wellness and recovery  Therapeutic Interventions: Assess for all discharge needs, 1 to 1 time with Social worker, Explore available resources and support systems, Assess for adequacy in community support network, Educate family and significant other(s) on suicide prevention, Complete  Psychosocial Assessment, Interpersonal group therapy.  Evaluation of Outcomes: Not Met  Progress in Treatment: Attending groups: Pt is new to milieu, continuing to assess  Participating in groups: Pt is new to milieu, continuing to assess  Taking medication as prescribed: Yes, MD continues to assess for medication changes as needed Toleration medication: Yes, no side effects reported at this time Family/Significant other contact made: No, CSW assessing for appropriate contact Patient understands diagnosis: Continuing to assess Discussing patient identified problems/goals with staff: Yes Medical problems stabilized or resolved: Yes Denies suicidal/homicidal ideation: No, pt recently admitted for SI. Issues/concerns per patient self-inventory: None Other: N/A  New problem(s) identified: None identified at this time.   New Short Term/Long Term Goal(s): None identified at this time.   Discharge Plan or Barriers: CSW still assessing for an appropriate plan.  Reason for Continuation of Hospitalization:  Anxiety  Depression Medication stabilization Suicidal ideation  Estimated Length of Stay: 3-5 days  Attendees: Patient: 09/26/2016 4:07 PM  Physician: Dr. Daron Offer 09/26/2016 4:07 PM  Nursing:  09/26/2016 4:07 PM  RN Care Manager: Lars Pinks, RN 09/26/2016 4:07 PM  Social Worker: Matthew Saras, [place] 09/26/2016 4:07 PM  Recreational Therapist:  09/26/2016 4:07 PM  Other: Lindell Spar, NP 09/26/2016 4:07 PM  Other:  09/26/2016 4:07 PM  Other: 09/26/2016 4:07 PM  Scribe for Treatment Team: Georga Kaufmann, MSW,LCSWA 09/26/2016 4:07 PM

## 2016-09-26 NOTE — Progress Notes (Signed)
Recreation Therapy Notes  Date:  09/26/16 Time: 0930 Location: [address] Dayroom  Group Topic: Stress Management  Goal Area(s) Addresses:  Patient will verbalize importance of using healthy stress management.  Patient will identify positive emotions associated with healthy stress management.   Intervention: Stress Management  Activity :  Guided Imagery.  LRT introduced the stress management technique of guided imagery.  LRT read Mills script to allow patients to engage in the activity.  Patients were to follow along with as LRT read the script to participate in the activity.  Education:  Stress Management, Discharge Planning.   Education Outcome: Acknowledges edcuation/In group clarification offered/Needs additional education  Clinical Observations/Feedback: Pt did not attend group.   Sandra Mills, LRT/CTRS         Sandra Mills 09/26/2016 11:28 AM 

## 2016-09-26 NOTE — Progress Notes (Signed)
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  Pt is experiencing the following stressor:  Divorce, Financial bankruptcy and legal issue.  55 year old son has given pt some bad news (that pt does not want to discuss), and pt is having difficulty with parent of one of her students.  Pt also had argument with boyfriend on the phone and hung-up on him. Pt denies SI, HI and AVH currently but was thinking of OD on medication.  Pt does not smoke, use alcohol or drugs.  Pt has no identifying marks such as scars or tattoos but doers have one "hickey" on her r side neck.  Pt has family hx of mom with bipolar and denies hx of substance abuse in the family.  Pt is perimenopausal and is sexually active.  Pt denies pain or discomfort and contracts, verbally for safety.   Pt wants to go to sleep and asks to skip breakfast.  Admission orders placed, and 15 min checks in place. Pt remains safe on unit.

## 2016-09-26 NOTE — ED Triage Notes (Signed)
Pt states that she was feeling SI tonight, she called [facility] and they sent [facility] to get her, Pt states that she recently gave all of her pain meds to her doctor so she wouldn't take them

## 2016-09-26 NOTE — BH Assessment (Addendum)
Tele Assessment Note   Sandra Mills is an 56 y.o. female, who presents voluntary and unaccompanied to [facility].Pt reported, she called [facility] and they called [facility] which transported her to [facility]. Pt reported, "I became suicidal on and off, this is the third time since last summer." Pt reported, "a lot of stuff in my life right now, all happening at the same time, pushing me, I can't handle it." Pt reported, last summer her husband told her he has  never been romantically attracted to her their entire 31 years of marriage. Pt reported, she also learned that during her marriage, her husband accumulated $130k in credit card debit. Pt reported, she works at [employer] and her husband is her boss, she feels he bullied her. Pt reported, "he does things to be mean." Pt reported, her father had two strokes and is currently paralyzed on his right side. Pt reported, last summer she thought about getting into a car accident on purpose so her family could collect the insurance money. Pt reported, she started dating however recently her and her boyfriend are not getting along. 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 Pt reported, tonight received a disturbing letter from her son. Pt reported, when she read the letter she said, "I feel like I wanted to die right now." Pt reported, "I don't have anyone I don't want to go back to work I feel ike throwing int he towel. Pt reported, a plan to take all her pills in her whole house. Pt denied, HI, AVH and self-injurious behaviors.   Pt denied abuse. Pt UDS/BAL is negative. Pt reported, she is prescribe Lexapro 20 mg and Clonopin by her primary care physician. Pt reported, taking medications as prescribe ut is unsure if she took her dose for today. Pt denied being linked to OPT resources (medication management and/or counseling). Pt denied previous inpatient admissions.   Pt presented crying in scrubs with  logical/cohernet speech. Pt's eye contact was good. Pt's mood was depressed. Pt's affect was depressed. Pt's thought process was coherent/relevant. Pt's judgement was unimpaired. Pt's concentration was normal. Pt's insight and impulse control are fair. Pt was oriented x4 (day, year, city and state.) Pt reported, if discharged from [facility] she could not contract for safety. Pt reported, if inpatient treatment was recommended she would sign-in voluntarily.   Diagnosis: Major Depressive Disorder, Recurrent, Severe without Psychosis.   Past Medical History:  Past Medical History:  Diagnosis Date  . Anxiety   . Colon polyps 12/09/12   three polyps; [facility]; repeat in 3-5 years.  . IBS (irritable bowel syndrome)    diarrhea predominant; s/p GI consult with colonoscopy  . Migraine    menstrually related.  . Tremor, essential     Past Surgical History:  Procedure Laterality Date  . COLONOSCOPY W/ POLYPECTOMY  12/09/12   three polyps; [place]. Repeat in 3-5 years.  . Cyst resection perineal      Family History:  Family History  Problem Relation Age of Onset  . Breast cancer Mother 24  . Cancer Mother 74       Breast cancer age 25  . Atrial fibrillation Father   . Atrial fibrillation Sister   . Breast cancer Other        maternal great aunt dx in her 55s  . Breast cancer Other        6 maternal great aunts dx in their 77s  Social History:  reports that she has never smoked. She has never used smokeless tobacco. She reports that she does not drink alcohol or use drugs.  Additional Social History:  Alcohol / Drug Use Pain Medications: See MAR Prescriptions: See MAR Over the Counter: See MAR History of alcohol / drug use?: No history of alcohol / drug abuse  CIWA: CIWA-Ar BP: 113/71 Pulse Rate: 75 COWS:    PATIENT STRENGTHS: (choose at least two) Average or above average intelligence General fund of knowledge  Allergies: No Known Allergies  Home Medications:  (Not in a hospital  admission)  OB/GYN Status:  No LMP recorded. Patient is perimenopausal.  General Assessment Data Location of Assessment: [facility] TTS Assessment: In system Is this a Tele or Face-to-Face Assessment?: Face-to-Face Is this an Initial Assessment or a Re-assessment for this encounter?: Initial Assessment Marital status: Separated Is patient pregnant?: No Pregnancy Status: No Living Arrangements: Alone Can pt return to current living arrangement?: Yes Admission Status: Voluntary Is patient capable of signing voluntary admission?: Yes Referral Source: Self/Family/Friend Insurance type: BCBS     Crisis Care Plan Living Arrangements: Alone Legal Guardian: Other: (Self) Name of Psychiatrist: NA Name of Therapist: NA  Education Status Is patient currently in school?: No Current Grade: NA Highest grade of school patient has completed: Pt reported, Masters degree. Name of school: NA Contact person: NA  Risk to self with the past 6 months Suicidal Ideation: Yes-Currently Present Has patient been a risk to self within the past 6 months prior to admission? : Yes Suicidal Intent: No-Not Currently/Within Last 6 Months Has patient had any suicidal intent within the past 6 months prior to admission? : Yes Is patient at risk for suicide?: Yes Suicidal Plan?: Yes-Currently Present Has patient had any suicidal plan within the past 6 months prior to admission? : Yes Specify Current Suicidal Plan: Pt reported, wanting to take all the pills in her house. Access to Means: Yes Specify Access to Suicidal Means: Pt has access to medications. What has been your use of drugs/alcohol within the last 12 months?: NA Previous Attempts/Gestures: No How many times?: 0 Other Self Harm Risks: NA Triggers for Past Attempts: None known Intentional Self Injurious Behavior: None (Pt denies. ) Family Suicide History: No Recent stressful life event(s): Divorce, Financial Problems Persecutory voices/beliefs?:  No Depression: Yes Depression Symptoms: Tearfulness, Insomnia, Isolating, Fatigue, Guilt, Feeling worthless/self pity, Loss of interest in usual pleasures Substance abuse history and/or treatment for substance abuse?: No Suicide prevention information given to non-admitted patients: Not applicable  Risk to Others within the past 6 months Homicidal Ideation: No (Pt denies.) Does patient have any lifetime risk of violence toward others beyond the six months prior to admission? : No Thoughts of Harm to Others: No Current Homicidal Intent: No Current Homicidal Plan: No Access to Homicidal Means: No Identified Victim: NA History of harm to others?: No Assessment of Violence: None Noted Violent Behavior Description: NA Does patient have access to weapons?: Yes (Comment) (kitchen knives. ) Criminal Charges Pending?: No Does patient have a court date: No Is patient on probation?: No  Psychosis Hallucinations: None noted Delusions: None noted  Mental Status Report Appearance/Hygiene: In scrubs Eye Contact: Good Motor Activity: Unremarkable Speech: Logical/coherent Level of Consciousness: Crying Mood: Depressed Affect: Depressed Anxiety Level: Minimal Thought Processes: Coherent, Relevant Judgement: Unimpaired Orientation: Other (Comment) (day, year, city and state.) Obsessive Compulsive Thoughts/Behaviors: None  Cognitive Functioning Concentration: Normal Memory: Recent Intact IQ: Average Insight: Fair Impulse Control: Fair Appetite:  Poor Sleep: Decreased Total Hours of Sleep:  (Pt reported, "not sleeping." ) Vegetative Symptoms: None  ADLScreening [facility] Assessment Services) Patient's cognitive ability adequate to safely complete daily activities?: Yes Patient able to express need for assistance with ADLs?: Yes Independently performs ADLs?: Yes (appropriate for developmental age)  Prior Inpatient Therapy Prior Inpatient Therapy: No Prior Therapy Dates: NA Prior  Therapy Facilty/Provider(s): NA Reason for Treatment: NA  Prior Outpatient Therapy Prior Outpatient Therapy: No Prior Therapy Dates: NA Prior Therapy Facilty/Provider(s): NA Reason for Treatment: NA Does patient have an ACCT team?: No Does patient have Intensive In-House Services?  : No Does patient have [facility] services? : No Does patient have P4CC services?: No  ADL Screening (condition at time of admission) Patient's cognitive ability adequate to safely complete daily activities?: Yes Is the patient deaf or have difficulty hearing?: No Does the patient have difficulty seeing, even when wearing glasses/contacts?: Yes Does the patient have difficulty concentrating, remembering, or making decisions?: Yes Patient able to express need for assistance with ADLs?: Yes Does the patient have difficulty dressing or bathing?: No Independently performs ADLs?: Yes (appropriate for developmental age) Does the patient have difficulty walking or climbing stairs?: No Weakness of Legs: None Weakness of Arms/Hands: None       Abuse/Neglect Assessment (Assessment to be complete while patient is alone) Physical Abuse: Denies (Pt denies. ) Verbal Abuse: Denies (Pt denies. ) Sexual Abuse: Denies (Pt denies. ) Exploitation of patient/patient's resources: Denies (Pt denies. ) Self-Neglect: Denies (Pt denies. )     Advance Directives (For Healthcare) Does Patient Have a Medical Advance Directive?: No    Additional Information 1:1 In Past 12 Months?: No CIRT Risk: No Elopement Risk: No Does patient have medical clearance?: Yes     Disposition: Pt has been accepted to [facility] by Selena Batten, AC and assigned to 405-1, pt can come before 0500. Accepting physician: Dr. Elna Breslow. Nursing report: [phone]. Disposition discussed with Will, PA and Atilade, RN.  Disposition Initial Assessment Completed for this Encounter: Yes Disposition of Patient: Inpatient treatment program Type of inpatient  treatment program: Adult  Gwinda Passe 09/26/2016 4:41 AM   Gwinda Passe, MS, [facility], [facility] Triage Specialist [phone]

## 2016-09-27 LAB — LIPID PANEL
CHOL/HDL RATIO: 3.4 ratio
Cholesterol: 191 mg/dL (ref 0–200)
HDL: 56 mg/dL (ref >40)
LDL Cholesterol: 108 mg/dL — ABNORMAL HIGH (ref 0–99)
Triglycerides: 135 mg/dL (ref <150)
VLDL: 27 mg/dL (ref 0–40)

## 2016-09-27 LAB — TSH: TSH: 2.917 u[IU]/mL (ref 0.350–4.500)

## 2016-09-27 NOTE — Progress Notes (Signed)
D Lupita LeashDonna is seen out in the milieu. She is very pleasant. Appreciative of any kindness. A SHe completes her daily assessment and on it she wrote she has experienced SI today, but she is able to verbally contract for safety. She rates her depression, hopelessness and axneity " 7/8/9", respectively. She is noted to have an involuntary familial tremor of her head and arms. R She spoke with Clinical research associatewriter at end of shift and shared she is beginning to feel safer and more grounded and is trying to identify what she can do to help herslef.

## 2016-09-27 NOTE — Progress Notes (Signed)
Patient ID: Candiss NorseDonna F Gregson, female   DOB: 05/03/1960, [age] y.o.   MRN: [id number]  Pt currently presents with a flat affect and depressed behavior. Pt reports to writer that their goal is to "talk to to other more, I talked in groups and to the CSW." Pt reports she is shy and has a hard time sharing concerns with others. Pt states "I have been writing in my journal." Pt reports good sleep with current medication regimen. Reports she was happy to see her kids and boyfriend tonight.   Pt provided with medications per providers orders. Pt's labs and vitals were monitored throughout the night. Pt given a 1:1 about emotional and mental status. Pt supported and encouraged to express concerns and questions. Pt educated on medication and given handouts on anxiety, assertiveness and therapeutic journaling activities. Baseline tremors noted in assessment.   Pt's safety ensured with 15 minute and environmental checks. Pt currently denies SI/HI and A/V hallucinations. Pt verbally agrees to seek staff if SI/HI or A/VH occurs and to consult with staff before acting on any harmful thoughts. Will continue POC.

## 2016-09-27 NOTE — Progress Notes (Signed)
Patient did not attend group.

## 2016-09-27 NOTE — Progress Notes (Signed)
[facility] MD Progress Note  09/27/2016 3:29 PM Sandra Mills  MRN:  [id number] Subjective:  Better  Per nursing: Patient denies HI/AVH.  She does endorse passive SI, "But I can't do anything in here." verbally contracts to come to staff if SI come to the point where she may act on it.Patient has been very tearful this shift and she went into detail about the stressors in her life currently. She was able to  interact well with staff and other patients after becoming acclimated to the unit..  Action. Emotional support and encouragement offered. Education provided on medication, indications and side effect. Q 15 minute checks done for safety.  Objective: Sandra Mills 56 year old female who presented to [facility] with increased depressive symptoms, anxiety, and suicidal ideation. Upon evaluation today she is alert and oriented, calm and cooperative. She is observed interacting with her peers. Her mood is depressed and her affect is depressed and flat. SHe reports feeling well, However still endorsing high levels of anxiety and depression. She states " I can go outside and interact with people instead of lying in my room all day. SHe is attending groups. SHe is rating her depression 6/10 and anxiety 5-6 /10 with 0 being the least and 10 being the worse. She denies suicidal and homicidal ideation, she does not appear to actively hallucinating or in a state of psychosis.   Principal Problem: Severe major depression without psychotic features (HCC) Diagnosis:   Patient Active Problem List   Diagnosis Date Noted  . Severe major depression without psychotic features (HCC) [F32.2] 09/26/2016  . Anxiety and depression [F41.9, F32.9] 06/23/2007  . TREMOR, ESSENTIAL [G25.0, G25.2] 06/23/2007  . Migraine, menstrual [G43.829] 06/23/2007   Total Time spent with patient: 30 minutes  Past Psychiatric History: Patient has no previous psychiatric admissions or outpatient psychiatric treatment but received antidepressant  medication, antianxiety medication from primary care physician. Patient reported she had hot flashes with Paxil and dry mouth with Effexor and Lexapro is not effective at later diagnosis.  Past Medical History:  Past Medical History:  Diagnosis Date  . Anxiety   . Colon polyps 12/09/12   three polyps; [facility]; repeat in 3-5 years.  . IBS (irritable bowel syndrome)    diarrhea predominant; s/p GI consult with colonoscopy  . Migraine    menstrually related.  . Tremor, essential     Past Surgical History:  Procedure Laterality Date  . COLONOSCOPY W/ POLYPECTOMY  12/09/12   three polyps; [place]. Repeat in 3-5 years.  . Cyst resection perineal     Family History:  Family History  Problem Relation Age of Onset  . Breast cancer Mother 47  . Cancer Mother 57       Breast cancer age 62  . Atrial fibrillation Father   . Atrial fibrillation Sister   . Breast cancer Other        maternal great aunt dx in her 17s  . Breast cancer Other        6 maternal great aunts dx in their 2s   Family Psychiatric  History:Family history significant for bipolar disorder and anxiety disorder and her sister and and diagnosed depression her mother. Her father recently suffered with stroke resultant right-sided paralysis, who lives in [place] Social History:  History  Alcohol Use No     History  Drug Use No    Social History   Social History  . Marital status: Legally Separated    Spouse name: 11/2015  . Number of  children: 4  . Years of education: Master's   Occupational History  . 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 Smoking status: Never Smoker  . Smokeless tobacco: Never Used  . Alcohol use No  . Drug use: No  . Sexual activity: Yes    Birth control/ protection: Surgical     Comment: Husband vasectomy   Other Topics Concern  . None   Social History Narrative   Marital status:  Married x 31 years, no abuse. Separated 11/2015. Dating.       Lives: Lives alone, children (1993, 1995, 1997, 1999) live with their father and visit her regulalry.  No grandchildren.      Children: 4 children; no grandchildren.      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  Previously worked [employer].   No tobacco/alcohol/drugs; wine three times per year.   Exercise sporadic; curves and [employer].   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     Additional Social History:                         Sleep: Fair  Appetite:  Fair  Current Medications: Current Facility-Administered Medications  Medication Dose Route Frequency Provider Last Rate Last Dose  . acetaminophen (TYLENOL) tablet 650 mg  650 mg Oral Q6H PRN Nira Conn A, NP   650 mg at 09/26/16 1341  . alum & mag hydroxide-simeth (MAALOX/MYLANTA) 200-200-20 MG/5ML suspension 30 mL  30 mL Oral Q4H PRN Nira Conn A, NP      . gabapentin (NEURONTIN) capsule 100 mg  100 mg Oral BID Leata Mouse, MD   100 mg at 09/27/16 0813  . hydrOXYzine (ATARAX/VISTARIL) tablet 25 mg  25 mg Oral Q6H PRN Burnard Leigh, MD   25 mg at 09/26/16 2305  . magnesium hydroxide (MILK OF MAGNESIA) suspension 30 mL  30 mL Oral Daily PRN Jackelyn Poling, NP      . ondansetron (ZOFRAN) tablet 4 mg  4 mg Oral Q6H PRN Burnard Leigh, MD        Lab Results:  Results for orders placed or performed during the hospital encounter of 09/26/16 (from the past 48 hour(s))  Lipid panel     Status: Abnormal   Collection Time: 09/27/16  6:27 AM  Result Value Ref Range   Cholesterol 191 0 - 200 mg/dL   Triglycerides 914 <782 mg/dL   HDL 56 >95 mg/dL   Total CHOL/HDL Ratio 3.4 RATIO   VLDL 27 0 - 40 mg/dL   LDL Cholesterol 621 (H) 0 - 99 mg/dL    Comment:        Total Cholesterol/HDL:CHD Risk Coronary Heart Disease Risk Table                     Men   Women  1/2 Average Risk   3.4   3.3  Average Risk       5.0   4.4  2 X Average Risk   9.6   7.1  3 X Average Risk  23.4   11.0         Use the calculated Patient Ratio above and the CHD Risk Table to determine the patient's CHD Risk.        ATP III CLASSIFICATION (LDL):  <100     mg/dL   Optimal  308-657  mg/dL   Near or Above  Optimal  130-159  mg/dL   Borderline  130-865160-189  mg/dL   High  >784>190     mg/dL   Very High Performed at [facility], [address] [place], [place] [address]   TSH     Status: None   Collection Time: 09/27/16  6:27 AM  Result Value Ref Range   TSH 2.917 0.350 - 4.500 uIU/mL    Comment: Performed by a 3rd Generation assay with a functional sensitivity of <=0.01 uIU/mL. Performed at [facility], [address] [place], [place] [address]     Blood Alcohol level:  Lab Results  Component Value Date   ETH <5 09/26/2016    Metabolic Disorder Labs: Lab Results  Component Value Date   HGBA1C 5.4 01/08/2015   MPG 108 01/08/2015   MPG 105 01/05/2013   No results found for: PROLACTIN Lab Results  Component Value Date   CHOL 191 09/27/2016   TRIG 135 09/27/2016   HDL 56 09/27/2016   CHOLHDL 3.4 09/27/2016   VLDL 27 09/27/2016   LDLCALC 108 (H) 09/27/2016   LDLCALC 91 01/08/2015    Physical Findings: AIMS: Facial and Oral Movements Muscles of Facial Expression: None, normal Lips and Perioral Area: None, normal Jaw: None, normal Tongue: None, normal,Extremity Movements Upper (arms, wrists, hands, fingers): None, normal Lower (legs, knees, ankles, toes): None, normal, Trunk Movements Neck, shoulders, hips: None, normal, Overall Severity Severity of abnormal movements (highest score from questions above): None, normal Incapacitation due to abnormal movements: None, normal Patient's awareness of abnormal movements (rate only patient's report): No Awareness, Dental Status Current problems with teeth and/or dentures?: Yes Does patient usually wear dentures?: No  CIWA:    COWS:     Musculoskeletal: Strength & Muscle Tone: within normal  limits Gait & Station: normal Patient leans: N/A  Psychiatric Specialty Exam: Physical Exam  ROS  Blood pressure 93/65, pulse (!) 102, temperature 98.4 F (36.9 C), temperature source Oral, resp. rate 16, height 5\' 3"  (1.6 m), weight 75.3 kg (166 lb), SpO2 100 %.Body mass index is 29.41 kg/m.  General Appearance: Fairly Groomed and Guarded  Eye Contact:  Fair  Speech:  Clear and Coherent and Normal Rate  Volume:  Normal  Mood:  Depressed  Affect:  Depressed and Flat  Thought Process:  Linear and Descriptions of Associations: Intact  Orientation:  Full (Time, Place, and Person)  Thought Content:  WDL  Suicidal Thoughts:  No  Homicidal Thoughts:  No  Memory:  Immediate;   Fair Recent;   Fair  Judgement:  Poor  Insight:  Lacking  Psychomotor Activity:  Normal  Concentration:  Concentration: Fair and Attention Span: Fair  Recall:  [employer]  Fund of Knowledge:  Good  Language:  Fair  Akathisia:  No  Handed:  Right  AIMS (if indicated):     Assets:  Communication Skills Desire for Improvement Financial Resources/Insurance Leisure Time Physical Health Talents/Skills Vocational/Educational  ADL's:  Intact  Cognition:  WNL  Sleep:  Number of Hours: 6.25     Treatment Plan Summary: Daily contact with patient to assess and evaluate symptoms and progress in treatment and Medication management 1. Will maintain Q 15 minutes observation for safety. Estimated LOS: 5-7 days 2. Patient will participate in group, milieu, and family therapy. Psychotherapy: Social and Doctor, hospitalcommunication skill training, anti-bullying, learning based strategies, cognitive behavioral, and family object relations individuation separation intervention psychotherapies can be considered.  3. Depression, not improving will start zoloft 25mg  po  daily and increase until tolerated. She was previously on Lexapro 20mg  po daily with no effects.  4. Insomnia-Vistaril 25 mg tid for anxiety and sleep. 5.  Anxiety  -Gabapentin 100mg  po BID,  6. Will continue to monitor patient's mood and behavior. 7. Social Work will schedule a Family meeting to obtain collateral information and discuss discharge and follow up plan. Discharge concerns will also be addressed: Safety, stabilization, and access to medication  Truman Hayward, FNP 09/27/2016, 3:29 PM

## 2016-09-27 NOTE — [facility] Group Notes (Signed)
Identifying Needs   Date:  09/27/2016  Time:  1300  Type of Therapy:  Nurse Education  /  Identifying needs   ; The group focuses on teaching patients how to identify their needs and then how to develop skills needed to get them met.  Participation Level:  Active  Participation Quality:  Attentive  Affect:  Appropriate  Cognitive:  Alert  Insight:  Limited  Engagement in Group:  Engaged  Modes of Intervention:  Education  Summary of Progress/Problems:  Sandra Mills 09/27/2016, 5:49 PM

## 2016-09-27 NOTE — BHH Group Notes (Signed)
Adult Therapy Group Note (Clinical Social Work)  Date:  09/27/2016  Time:  10:00-11:00AM  Group Topic/Focus:  HEALTHY COPING SKILLS  Today's group focused on identifying healthy coping skills already in use by each patient, as well as healthy coping skills they would like to learn.  There was much sharing, support, psychoeducation, and encouragement provided.  Participation Level:  Active  Participation Quality:  Attentive, Sharing and Supportive  Affect:  Anxious, Blunted and Depressed  Cognitive:  Alert and Appropriate  Insight: Improving  Engagement in Group:  Engaged  Modes of Intervention:  Discussion, Support and Processing  Additional Comments:  The patient shared that healthy coping already used is talking to a few close people in her life.  She would like to learn more grounding techniques and stated she did take the suggestion last night to write things in a journal, which she felt was beneficial and she will continue doing.    Carloyn JaegerMareida J Grossman-Orr 09/27/2016, 1:28 PM

## 2016-09-28 LAB — HEMOGLOBIN A1C
HEMOGLOBIN A1C: 5.3 % (ref 4.8–5.6)
MEAN PLASMA GLUCOSE: 105 mg/dL

## 2016-09-28 MED ORDER — SERTRALINE HCL 25 MG PO TABS
25.0000 mg | ORAL_TABLET | Freq: Every day | ORAL | Status: DC
  Administered 2016-09-28 – 2016-09-29 (×2): 25 mg via ORAL
  Filled 2016-09-28 (×5): qty 1

## 2016-09-28 NOTE — Progress Notes (Signed)
Pt attended group and said her day was a 7.  Her coping skills were Journaling and conversing with family members.

## 2016-09-28 NOTE — Progress Notes (Signed)
D: Sandra Mills said she had felt anxious this morning during "all that yelling," in which another patient began yelling at another. She said she felt better later, however. She denied SI, HI, and AVH. She reported fair sleep, poor appetite, low energy level, and good concentration. She rated her depression 5/10, hopelessness 6/10, and anxiety 9/10. She has been pleasant and cooperative on the unit, with no behavioral issues noted.  A: Meds given as ordered. Q15 safety checks maintained. Support/encouragement offered.  R: Pt remains free from harm and continues with treatment. Will continue to monitor for needs/safety.

## 2016-09-28 NOTE — Progress Notes (Signed)
[facility] MD Progress Note  09/28/2016 12:00 PM Sandra Mills  MRN:  [id number] Subjective: Im doing pretty good. And I got a little anxious this morning. One of the patients was getting loud and angry, things like that always get me nervous. I was able to remove my self from the situation, read magazine, and talked to my peers so all of that helped.   Per nursing: Pt currently presents with a flat affect and depressed behavior. Pt reports to writer that their goal is to "talk to to other more, I talked in groups and to the CSW." Pt reports she is shy and has a hard time sharing concerns with others. Pt states "I have been writing in my journal." Pt reports good sleep with current medication regimen. Reports she was happy to see her kids and boyfriend tonight.   Pt provided with medications per providers orders. Pt's labs and vitals were monitored throughout the night. Pt given a 1:1 about emotional and mental status. Pt supported and encouraged to express concerns and questions. Pt educated on medication and given handouts on anxiety, assertiveness and therapeutic journaling activities. Baseline tremors noted in assessment.   Pt's safety ensured with 15 minute and environmental checks. Pt currently denies SI/HI and A/V hallucinations. Pt verbally agrees to seek staff if SI/HI or A/VH occurs and to consult with staff before acting on any harmful thoughts. Will continue POC.  Objective: Sandra Mills 56 year old female who presented to [facility] with increased depressive symptoms, anxiety, and suicidal ideation. Upon evaluation today she is alert and oriented, calm and cooperative. She had fair eye contact and smiles appropriately. She reports having anxiety and nightmares, last night which disturbed her sleep. She reports getting about  5 Hours of sleep. She is endorsing eating ok, still working on her appetite. She is observed interacting with her peers. Her mood is depressed although she brightens upon approach, and  her affect is depressed and flat. She reports her goal for today is to deal with her anxiety.  She denies suicidal and homicidal ideation, she does not appear to actively hallucinating or in a state of psychosis.   Principal Problem: Severe major depression without psychotic features (HCC) Diagnosis:   Patient Active Problem List   Diagnosis Date Noted  . Severe major depression without psychotic features (HCC) [F32.2] 09/26/2016  . Anxiety and depression [F41.9, F32.9] 06/23/2007  . TREMOR, ESSENTIAL [G25.0, G25.2] 06/23/2007  . Migraine, menstrual [G43.829] 06/23/2007   Total Time spent with patient: 20 minutes  Past Psychiatric History: Patient has no previous psychiatric admissions or outpatient psychiatric treatment but received antidepressant medication, antianxiety medication from primary care physician. Patient reported she had hot flashes with Paxil and dry mouth with Effexor and Lexapro is not effective at later diagnosis.  Past Medical History:  Past Medical History:  Diagnosis Date  . Anxiety   . Colon polyps 12/09/12   three polyps; [facility]; repeat in 3-5 years.  . IBS (irritable bowel syndrome)    diarrhea predominant; s/p GI consult with colonoscopy  . Migraine    menstrually related.  . Tremor, essential     Past Surgical History:  Procedure Laterality Date  . COLONOSCOPY W/ POLYPECTOMY  12/09/12   three polyps; [place]. Repeat in 3-5 years.  . Cyst resection perineal     Family History:  Family History  Problem Relation Age of Onset  . Breast cancer Mother 26  . Cancer Mother 53       Breast cancer age  50  . Atrial fibrillation Father   . Atrial fibrillation Sister   . Breast cancer Other        maternal great aunt dx in her 32s  . Breast cancer Other        6 maternal great aunts dx in their 12s   Family Psychiatric  History:Family history significant for bipolar disorder and anxiety disorder and her sister and and diagnosed depression her mother. Her father  recently suffered with stroke resultant right-sided paralysis, who lives in [place] Social History:  History  Alcohol Use No     History  Drug Use No    Social History   Social History  . Marital status: Legally Separated    Spouse name: 11/2015  . Number of children: 4  . Years of education: Master's   Occupational History  . 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 Smoking status: Never Smoker  . Smokeless tobacco: Never Used  . Alcohol use No  . Drug use: No  . Sexual activity: Yes    Birth control/ protection: Surgical     Comment: Husband vasectomy   Other Topics Concern  . None   Social History Narrative   Marital status:  Married x 31 years, no abuse. Separated 11/2015. Dating.      Lives: Lives alone, children (1993, 1995, 1997, 1999) live with their father and visit her regulalry.  No grandchildren.      Children: 4 children; no grandchildren.      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  Previously worked [employer].   No tobacco/alcohol/drugs; wine three times per year.   Exercise sporadic; curves and [employer].   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     Additional Social History:                         Sleep: Fair  Appetite:  Fair  Current Medications: Current Facility-Administered Medications  Medication Dose Route Frequency Provider Last Rate Last Dose  . acetaminophen (TYLENOL) tablet 650 mg  650 mg Oral Q6H PRN Nira Conn A, NP   650 mg at 09/26/16 1341  . alum & mag hydroxide-simeth (MAALOX/MYLANTA) 200-200-20 MG/5ML suspension 30 mL  30 mL Oral Q4H PRN Nira Conn A, NP      . gabapentin (NEURONTIN) capsule 100 mg  100 mg Oral BID Leata Mouse, MD   100 mg at 09/28/16 0753  . hydrOXYzine (ATARAX/VISTARIL) tablet 25 mg  25 mg Oral Q6H PRN Burnard Leigh, MD   25 mg at 09/27/16 2158  . magnesium hydroxide (MILK OF MAGNESIA) suspension 30 mL  30 mL Oral Daily PRN  Jackelyn Poling, NP      . ondansetron (ZOFRAN) tablet 4 mg  4 mg Oral Q6H PRN Burnard Leigh, MD        Lab Results:  Results for orders placed or performed during the hospital encounter of 09/26/16 (from the past 48 hour(s))  Hemoglobin A1c     Status: None   Collection Time: 09/27/16  6:27 AM  Result Value Ref Range   Hgb A1c MFr Bld 5.3 4.8 - 5.6 %    Comment: (NOTE)         Pre-diabetes: 5.7 - 6.4         Diabetes: >6.4         Glycemic control for adults with diabetes: <7.0  Mean Plasma Glucose 105 mg/dL    Comment: (NOTE) Performed At: [facility] [address] [place], [place] [id number] Mila Homer MD WU:[id number] Performed at [facility], [address] [place], [place] [address]   Lipid panel     Status: Abnormal   Collection Time: 09/27/16  6:27 AM  Result Value Ref Range   Cholesterol 191 0 - 200 mg/dL   Triglycerides 308 <657 mg/dL   HDL 56 >84 mg/dL   Total CHOL/HDL Ratio 3.4 RATIO   VLDL 27 0 - 40 mg/dL   LDL Cholesterol 696 (H) 0 - 99 mg/dL    Comment:        Total Cholesterol/HDL:CHD Risk Coronary Heart Disease Risk Table                     Men   Women  1/2 Average Risk   3.4   3.3  Average Risk       5.0   4.4  2 X Average Risk   9.6   7.1  3 X Average Risk  23.4   11.0        Use the calculated Patient Ratio above and the CHD Risk Table to determine the patient's CHD Risk.        ATP III CLASSIFICATION (LDL):  <100     mg/dL   Optimal  295-284  mg/dL   Near or Above                    Optimal  130-159  mg/dL   Borderline  132-440  mg/dL   High  >102     mg/dL   Very High Performed at [facility], [address] [place], [place] [address]   TSH     Status: None   Collection Time: 09/27/16  6:27 AM  Result Value Ref Range   TSH 2.917 0.350 - 4.500 uIU/mL    Comment: Performed by a 3rd Generation assay with a functional sensitivity of <=0.01 uIU/mL. Performed at [facility], [address], [place], [place] [address]     Blood Alcohol level:  Lab Results  Component Value Date   ETH <5 09/26/2016    Metabolic Disorder Labs: Lab Results  Component Value Date   HGBA1C 5.3 09/27/2016   MPG 105 09/27/2016   MPG 108 01/08/2015   No results found for: PROLACTIN Lab Results  Component Value Date   CHOL 191 09/27/2016   TRIG 135 09/27/2016   HDL 56 09/27/2016   CHOLHDL 3.4 09/27/2016   VLDL 27 09/27/2016   LDLCALC 108 (H) 09/27/2016   LDLCALC 91 01/08/2015    Physical Findings: AIMS: Facial and Oral Movements Muscles of Facial Expression: None, normal Lips and Perioral Area: None, normal Jaw: None, normal Tongue: None, normal,Extremity Movements Upper (arms, wrists, hands, fingers): None, normal Lower (legs, knees, ankles, toes): None, normal, Trunk Movements Neck, shoulders, hips: None, normal, Overall Severity Severity of abnormal movements (highest score from questions above): None, normal Incapacitation due to abnormal movements: None, normal Patient's awareness of abnormal movements (rate only patient's report): No Awareness, Dental Status Current problems with teeth and/or dentures?: Yes (One implant that is partial) Does patient usually wear dentures?: No  CIWA:    COWS:     Musculoskeletal: Strength & Muscle Tone: within normal limits Gait & Station: normal Patient leans: N/A  Psychiatric Specialty Exam: Physical Exam   ROS   Blood  pressure 98/75, pulse 90, temperature 99.2 F (37.3 C), temperature source Oral, resp. rate 16, height 5\' 3"  (1.6 m), weight 75.3 kg (166 lb), SpO2 100 %.Body mass index is 29.41 kg/m.  General Appearance: Fairly Groomed and Guarded  Eye Contact:  Fair  Speech:  Clear and Coherent and Normal Rate  Volume:  Normal  Mood:  Improving  Affect:  Congruent and Depressed  Thought Process:  Coherent, Linear and Descriptions of Associations: Intact  Orientation:  Full (Time, Place, and Person)   Thought Content:  Logical  Suicidal Thoughts:  No  Homicidal Thoughts:  No  Memory:  Immediate;   Good Recent;   Good Remote;   Good  Judgement:  Intact  Insight:  Fair and Present  Psychomotor Activity:  Normal  Concentration:  Concentration: Fair and Attention Span: Fair  Recall:  Fis[employer]Fair  Fund of Knowledge:  Good  Language:  Fair  Akathisia:  No  Handed:  Right  AIMS (if indicated):     Assets:  Communication Skills Desire for Improvement Financial Resources/Insurance Leisure Time Physical Health Talents/Skills Vocational/Educational  ADL's:  Intact  Cognition:  WNL  Sleep:  Number of Hours: 6.75     Treatment Plan Summary: Daily contact with patient to assess and evaluate symptoms and progress in treatment and Medication management 1. Will maintain Q 15 minutes observation for safety. Estimated LOS: 5-7 days 2. Patient will participate in group, milieu, and family therapy. Psychotherapy: Social and Doctor, hospitalcommunication skill training, anti-bullying, learning based strategies, cognitive behavioral, and family object relations individuation separation intervention psychotherapies can be considered.  3. Depression, not improving will start zoloft 25mg  po daily and increase until tolerated.  4. Insomnia-Vistaril 25 mg tid for anxiety and sleep. 5.  Anxiety -Gabapentin 100mg  po BID,  6. Will continue to monitor patient's mood and behavior. 7. Social Work will schedule a Family meeting to obtain collateral information and discuss discharge and follow up plan. Discharge concerns will also be addressed: Safety, stabilization, and access to medication  Truman Haywardakia S Starkes, FNP 09/28/2016, 12:00 PM

## 2016-09-28 NOTE — [facility] Group Notes (Signed)
[facility] Group Notes:  (Clinical Social Work)   09/28/2016    1:15-2:15PM  Summary of Progress/Problems:   The main focus of today's process group was to   1)  discuss the importance of adding supports  2)  define health supports versus unhealthy supports  4)  identify the patient's current healthy supports and  4)  plan what healthy supports to add.  An emphasis was placed on using counselor, doctor, therapy groups, 12-step groups, and problem-specific support groups to expand supports.    The patient expressed full comprehension of the concepts presented, and agreed that there is a need to add more supports.  The patient stated her children, boyfriend, and doctor are healthy supports while she wants to add a therapist at discharge.  Type of Therapy:  Process Group with Motivational Interviewing  Participation Level:  Active  Participation Quality:  Attentive  Affect:  Anxious  Cognitive:  Appropriate  Insight:  Engaged  Engagement in Therapy:  Engaged  Modes of Intervention:   Education, Support and Processing  Ambrose MantleMareida Grossman-Orr, LCSW 09/28/2016    4:50 PM

## 2016-09-28 NOTE — Plan of Care (Signed)
Problem: Safety: Goal: Periods of time without injury will increase Outcome: Progressing Pt remains safe on the unit.  Problem: Self-Concept: Goal: Level of anxiety will decrease Outcome: Not Progressing Pt rated her anxiety 9/10 this morning.
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
## 2016-09-28 NOTE — Progress Notes (Signed)
D: Pt was in the dayroom upon initial approach.  Pt presents with depressed affect and mood.  She reports she was anxious earlier today because of a peer's behavior and her goal was "being able to deal with that."  Pt reports she feels "much better" now.  She reports having a good visit with her boyfriend tonight.  Pt denies SI/HI, denies hallucinations, denies pain.  Pt has been visible in milieu interacting with peers and staff appropriately.   A: Introduced self to pt.  Actively listened to pt and offered support and encouragement.  PRN medication administered for anxiety.  Q15 minute safety checks maintained.  R: Pt is safe on the unit.  Pt is compliant with medication.  Pt verbally contracts for safety.  Will continue to monitor and assess.

## 2016-09-29 DIAGNOSIS — F419 Anxiety disorder, unspecified: Secondary | ICD-10-CM

## 2016-09-29 MED ORDER — SERTRALINE HCL 50 MG PO TABS
50.0000 mg | ORAL_TABLET | Freq: Every day | ORAL | Status: DC
  Administered 2016-09-30: 50 mg via ORAL
  Filled 2016-09-29 (×3): qty 1

## 2016-09-29 MED ORDER — GABAPENTIN 100 MG PO CAPS
200.0000 mg | ORAL_CAPSULE | Freq: Two times a day (BID) | ORAL | Status: DC
  Administered 2016-09-29 – 2016-09-30 (×2): 200 mg via ORAL
  Filled 2016-09-29 (×6): qty 2

## 2016-09-29 NOTE — [facility] Group Notes (Signed)
[facility] Note   09/29/2016 4:23 PM  Participation Quality:  appropriate  Mood/Affect:  Appropriate  Plan for Discharge/Comments:  Will return home w boyfriend, assessment for Mental Health IOP program scheduled.  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

## 2016-09-29 NOTE — Progress Notes (Signed)
Pt attended group and said that her day was a 7. Her coping skills were socializing and watching TV.

## 2016-09-29 NOTE — Progress Notes (Signed)
D: Pt was in the hallway upon initial approach.  Pt presents with anxious affect and mood.  Pt reports she "woke up and felt peaceful, so my goal was to stay that way all day."  Pt reports she met her goal.  She reports that today was "the best day so far."  Pt reports having a good visit with her boyfriend.  She smiles while talking to writer.  Pt denies SI/HI, denies hallucinations, denies pain.  Pt has been visible in milieu interacting with peers and staff appropriately.  Pt attended evening group.    A: Introduced self to pt.  Actively listened to pt and offered support and encouragement.  PRN medication administered for anxiety per request.  Q15 minute safety checks maintained.  R: Pt is safe on the unit.  Pt is compliant with medication.  Pt verbally contracts for safety.  Will continue to monitor and assess.   

## 2016-09-29 NOTE — Progress Notes (Signed)
DAR NOTE: Patient presents with anxious affect and depressed mood. Pt has been visible in the milieu. Complained of headache, denied auditory and visual hallucinations.  Rates depression at 6, hopelessness at 6, and anxiety at 6.  Maintained on routine safety checks.  Medications given as prescribed.  Support and encouragement offered as needed.  Attended group and participated.  States goal for today is " I will use coping to lower anxiety."  Patient observed socializing with peers in the dayroom.  Offered no complaint.

## 2016-09-29 NOTE — [facility] Suicide Risk Assessment (Signed)
[facility] INPATIENT:  Family/Significant Other Suicide Prevention Education  Suicide Prevention Education:  Education Completed; Boyfriend, Sandra Mills, [phone],  (name of family member/significant other) has been identified by the patient as the family member/significant other with whom the patient will be residing, and identified as the person(s) who will aid the patient in the event of a mental health crisis (suicidal ideations/suicide attempt).  With written consent from the patient, the family member/significant other has been provided the following suicide prevention education, prior to the and/or following the discharge of the patient.  The suicide prevention education provided includes the following:  Suicide risk factors  Suicide prevention and interventions  National Suicide Hotline telephone number  [facility] assessment telephone number  [facility] 911  [facility] and/or [facility]idential Mobile Crisis [facility] telephone number  Request made of family/significant other to:  Remove weapons (e.g., guns, rifles, knives), all items previously/currently identified as safety concern.    Remove drugs/medications (over-the-counter, prescriptions, illicit drugs), all items previously/currently identified as a safety concern.  The family member/significant other verbalizes understanding of the suicide prevention education information provided.  The family member/significant other agrees to remove the items of safety concern listed above.  Has visited daily,"each day she's a little bit better", "shes excited about coming home tomorrow."  Boyfriend has never had any concerns about suicidal thinking, "I've been dating her for 6 months, I had no idea she'd get this upset, she's very timid."  "She lacks confidence."  Principal has talked w boyfriend and stated that they are working on ways to reduce stress/pressure on patient.  Trigger prior to admission was  issue w one of patient's sons; however, school was also a significant stressor.  No access to weapons at home; boyfriend keeps his own weapons under lock or will remove from premises.  PAtient has "turned over her bad medications - some opiates from prior dental surgery - so she has no access to the strong medications at the house."  No concerns about patient discharge tomorrow.    Sandra Mills 09/29/2016, 3:54 PM

## 2016-09-29 NOTE — Progress Notes (Signed)
[facility] MD Progress Note  09/29/2016 4:00 PM Sandra Mills  MRN:  [id number] Subjective: patient reports she is feeling better than she did prior to admission, and denies medication side effects a this time.  Objective:  I have discussed case with treatment team and have met with patient . Patient is a 56 year old female, employed, who presented voluntarily due to worsening depression, anxiety and suicidal ideations. Reports financial and relationship stressors have been contributors to depression . At present states she is feeling better, and is currently hoping for discharge soon. She denies medication side effects, and feels current medication regimen is helping . No disruptive or agitated behaviors on unit, going to some groups . Visible on unit .    Principal Problem: Severe major depression without psychotic features ([place]) Diagnosis:   Patient Active Problem List   Diagnosis Date Noted  . Severe major depression without psychotic features ([place]) [F32.2] 09/26/2016  . Anxiety and depression [F41.9, F32.9] 06/23/2007  . TREMOR, ESSENTIAL [G25.0, G25.2] 06/23/2007  . Migraine, menstrual [G43.829] 06/23/2007   Total Time spent with patient: 20 minutes  Past Psychiatric History: Patient has no previous psychiatric admissions or outpatient psychiatric treatment but received antidepressant medication, antianxiety medication from primary care physician. Patient reported she had hot flashes with Paxil and dry mouth with Effexor and Lexapro is not effective at later diagnosis.  Past Medical History:  Past Medical History:  Diagnosis Date  . Anxiety   . Colon polyps 12/09/12   three polyps; [place]; repeat in 3-5 years.  . IBS (irritable bowel syndrome)    diarrhea predominant; s/p GI consult with colonoscopy  . Migraine    menstrually related.  . Tremor, essential     Past Surgical History:  Procedure Laterality Date  . COLONOSCOPY W/ POLYPECTOMY  12/09/12   three polyps; [place]. Repeat in  3-5 years.  . Cyst resection perineal     Family History:  Family History  Problem Relation Age of Onset  . Breast cancer Mother 15  . Cancer Mother 24       Breast cancer age 47  . Atrial fibrillation Father   . Atrial fibrillation Sister   . Breast cancer Other        maternal great aunt dx in her 86s  . Breast cancer Other        6 maternal great aunts dx in their 77s   Family Psychiatric  History:Family history significant for bipolar disorder and anxiety disorder and her sister and and diagnosed depression her mother. Her father recently suffered with stroke resultant right-sided paralysis, who lives in [place] History:  History  Alcohol Use No     History  Drug Use No    Social History   Social History  . Marital status: Legally Separated    Spouse name: 11/2015  . Number of children: 4  . Years of education: Master's   Occupational History  . [employer] (choir/voice)   Social History Main Topics  . Smoking status: Never Smoker  . Smokeless tobacco: Never Used  . Alcohol use No  . Drug use: No  . Sexual activity: Yes    Birth control/ protection: Surgical     Comment: Husband vasectomy   Other Topics Concern  . None   Social History Narrative   Marital status:  Married x 31 years, no abuse. Separated 11/2015. Dating.      Lives: Lives alone, children ([place]) live  with their father and visit her regulalry.  No grandchildren.      Children: 4 children; no grandchildren.      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  Previously worked [employer].   No tobacco/alcohol/drugs; wine three times per year.   Exercise sporadic; curves and [employer].   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     Additional Social History:   Sleep: improving   Appetite:  improving   Current Medications: Current Facility-Administered Medications  Medication Dose Route Frequency Provider Last Rate Last Dose  . acetaminophen  (TYLENOL) tablet 650 mg  650 mg Oral Q6H PRN Lindon Romp A, NP   650 mg at 09/26/16 1341  . alum & mag hydroxide-simeth (MAALOX/MYLANTA) 200-200-20 MG/5ML suspension 30 mL  30 mL Oral Q4H PRN Lindon Romp A, NP      . gabapentin (NEURONTIN) capsule 200 mg  200 mg Oral BID Camilo Mander, Myer Peer, MD      . hydrOXYzine (ATARAX/VISTARIL) tablet 25 mg  25 mg Oral Q6H PRN Aundra Dubin, MD   25 mg at 09/28/16 2211  . magnesium hydroxide (MILK OF MAGNESIA) suspension 30 mL  30 mL Oral Daily PRN Lindon Romp A, NP      . ondansetron (ZOFRAN) tablet 4 mg  4 mg Oral Q6H PRN Aundra Dubin, MD      . Derrill Memo ON 09/30/2016] sertraline (ZOLOFT) tablet 50 mg  50 mg Oral Daily Tonyia Marschall, Myer Peer, MD        Lab Results:  No results found for this or any previous visit (from the past 48 hour(s)).  Blood Alcohol level:  Lab Results  Component Value Date   ETH <5 22/97/9892    Metabolic Disorder Labs: Lab Results  Component Value Date   HGBA1C 5.3 09/27/2016   MPG 105 09/27/2016   MPG 108 01/08/2015   No results found for: PROLACTIN Lab Results  Component Value Date   CHOL 191 09/27/2016   TRIG 135 09/27/2016   HDL 56 09/27/2016   CHOLHDL 3.4 09/27/2016   VLDL 27 09/27/2016   LDLCALC 108 (H) 09/27/2016   LDLCALC 91 01/08/2015    Physical Findings: AIMS: Facial and Oral Movements Muscles of Facial Expression: None, normal Lips and Perioral Area: None, normal Jaw: None, normal Tongue: None, normal,Extremity Movements Upper (arms, wrists, hands, fingers): None, normal Lower (legs, knees, ankles, toes): None, normal, Trunk Movements Neck, shoulders, hips: None, normal, Overall Severity Severity of abnormal movements (highest score from questions above): None, normal Incapacitation due to abnormal movements: None, normal Patient's awareness of abnormal movements (rate only patient's report): No Awareness, Dental Status Current problems with teeth and/or dentures?: Yes (One implant  that is partial) Does patient usually wear dentures?: No  CIWA:    COWS:     Musculoskeletal: Strength & Muscle Tone: within normal limits Gait & Station: normal Patient leans: N/A  Psychiatric Specialty Exam: Physical Exam  ROS denies chest pain, no shortness of breath, no vomiting   Blood pressure 104/71, pulse 85, temperature 98.8 F (37.1 C), temperature source Oral, resp. rate 16, height _0  (1.6 m), weight 75.3 kg (166 lb), SpO2 100 %.Body mass index is 29.41 kg/m.  General Appearance: Well Groomed  Eye Contact:  Good  Speech:  Normal Rate  Volume:  Normal  Mood:  reports she feels better than she did on admission  Affect:  Appropriate and reactive   Thought Process:  Linear and Descriptions of Associations: Intact  Orientation:  Other:  fully alert and attentive   Thought Content:  denies hallucinations, no delusions expressed   Suicidal Thoughts:  No denies any suicidal plan or intention, contracts for safety on unit   Homicidal Thoughts:  No  Memory:  Recent and remote grossly intact   Judgement:  Other:  improved   Insight:  improving   Psychomotor Activity:  Normal  Concentration:  Concentration: Good and Attention Span: Good  Recall:  Good  Fund of Knowledge:  Good  Language:  Good  Akathisia:  Negative  Handed:  Right  AIMS (if indicated):     Assets:  Communication Skills  ADL's:  Intact  Cognition:  WNL  Sleep:  Number of Hours: 6.75   Assessment - patient reports gradually improving mood and range of affect. Presents calm, pleasant on approach. Denies suicidal ideations at present. Thus far tolerating Zoloft and Neurontin well . She is currently on Neurontin for anxiety management, denies side effects. We have reviewed medication side effects, and off label use regarding Gabapentin- patient wants to continue this medication as she feels it is helping and is well tolerated .  Treatment Plan Summary: I have reviewed treatment plan as below today  6/4 Daily contact with patient to assess and evaluate symptoms and progress in treatment and Medication management 1. Encourage group, milieu participation to work on coping skills and symptom reduction  2. Increase Zoloft to 50 mgrs QDAY for depression and anxiety, side effects reviewed  3. Increase Neurontin to 200 mgrs BID for anxiety  4. Continue Vistaril 25 mg Q 6 hours for anxiety as needed  5. Treatment team working on disposition planning options  6.   Jenne Campus, MD 09/29/2016, 4:00 PM   Patient ID: Sandra Mills, female   DOB: 11/03/1960, 56 y.o.   MRN: [id number]

## 2016-09-29 NOTE — Progress Notes (Signed)
Adult Psychoeducational Group Note  Date:  09/29/2016 Time:  9:54 PM  Group Topic/Focus:  Wrap-Up Group:   The focus of this group is to help patients review their daily goal of treatment and discuss progress on daily workbooks.  Participation Level:  Active  Participation Quality:  Appropriate  Affect:  Appropriate  Cognitive:  Alert  Insight: Appropriate  Engagement in Group:  Engaged  Modes of Intervention:  Discussion  Additional Comments:  Patient's goal for today was to not have anxiety. Patient met goal.  Benjamim Harnish L Jawan Chavarria 09/29/2016, 9:54 PM

## 2016-09-29 NOTE — Plan of Care (Signed)
Problem: Activity: Goal: Sleeping patterns will improve Outcome: Progressing Pt slept 6.75 hour last night according to flowsheet.    

## 2016-09-29 NOTE — [facility] Group Notes (Signed)
[facility] LCSW Group Therapy  09/29/2016 1:15pm  Type of Therapy:  Group Therapy vercoming Obstacles  Participation Level:  Reserved  Participation Quality:  Appropriate   Affect:  Appropriate  Cognitive:  Appropriate and Oriented  Insight:  Developing/Improving and Improving  Engagement in Therapy:  Improving  Modes of Intervention:  Discussion, Exploration, Problem-solving and Support  Description of Group:   In this group patients will be encouraged to explore what they see as obstacles to their own wellness and recovery. They will be guided to discuss their thoughts, feelings, and behaviors related to these obstacles. The group will process together ways to cope with barriers, with attention given to specific choices patients can make. Each patient will be challenged to identify changes they are motivated to make in order to overcome their obstacles. This group will be process-oriented, with patients participating in exploration of their own experiences as well as giving and receiving support and challenge from other group members.  Summary of Patient Progress: Pt was attentive and participated intermittently in group discussion, identifying the relationship with her ex-husband as an obstacle.    Therapeutic Modalities:   Cognitive Behavioral Therapy Solution Focused Therapy Motivational Interviewing Relapse Prevention Therapy   Vernie ShanksLauren Orpha Dain, LCSW 09/29/2016 4:30 PM

## 2016-09-29 NOTE — Progress Notes (Signed)
Recreation Therapy Notes  Date: 09/29/16 Time: 0930 Location: 300 Hall Group Room  Group Topic: Stress Management  Goal Area(s) Addresses:  Patient will verbalize importance of using healthy stress management.  Patient will identify positive emotions associated with healthy stress management.   Behavioral Response: Engaged  Intervention: Stress Management  Activity :  LRT introduced the stress management technique of meditation.  LRT played a meditation on gratitude to allow patients to engage in the meditation.  Patients were to follow along as the meditation played to fully participate in the activity.   Education:  Stress Management, Discharge Planning.   Education Outcome: Acknowledges edcuation/In group clarification offered/Needs additional education  Clinical Observations/Feedback: Pt attended group.   Sandra Mills, Sandra Mills         Lillia AbedLindsay, Jenifer Struve A 09/29/2016 11:35 AM

## 2016-09-30 MED ORDER — GABAPENTIN 100 MG PO CAPS: 200.0000 mg | ORAL_CAPSULE | Freq: Two times a day (BID) | ORAL | 0 refills | Status: DC

## 2016-09-30 MED ORDER — HYDROXYZINE HCL 25 MG PO TABS
25.0000 mg | ORAL_TABLET | Freq: Four times a day (QID) | ORAL | 0 refills | Status: DC | PRN
Start: 2016-09-30 — End: 2016-10-08

## 2016-09-30 MED ORDER — SERTRALINE HCL 50 MG PO TABS: 50.0000 mg | ORAL_TABLET | Freq: Every day | ORAL | 0 refills | Status: DC

## 2016-09-30 NOTE — Tx Team (Signed)
Interdisciplinary Treatment and Diagnostic Plan Update 09/30/2016 Time of Session: 9:30am  Sandra Mills  MRN: [id number]  Principal Diagnosis: Severe major depression without psychotic features [facility])  Secondary Diagnoses: Principal Problem:   Severe major depression without psychotic features (HCC)   Current Medications:  Current Facility-Administered Medications  Medication Dose Route Frequency Provider Last Rate Last Dose  . acetaminophen (TYLENOL) tablet 650 mg  650 mg Oral Q6H PRN Nira Conn A, NP   650 mg at 09/26/16 1341  . alum & mag hydroxide-simeth (MAALOX/MYLANTA) 200-200-20 MG/5ML suspension 30 mL  30 mL Oral Q4H PRN Nira Conn A, NP      . gabapentin (NEURONTIN) capsule 200 mg  200 mg Oral BID Cobos, Rockey Situ, MD   200 mg at 09/30/16 4098  . hydrOXYzine (ATARAX/VISTARIL) tablet 25 mg  25 mg Oral Q6H PRN Burnard Leigh, MD   25 mg at 09/29/16 2104  . magnesium hydroxide (MILK OF MAGNESIA) suspension 30 mL  30 mL Oral Daily PRN Nira Conn A, NP      . ondansetron (ZOFRAN) tablet 4 mg  4 mg Oral Q6H PRN Burnard Leigh, MD      . sertraline (ZOLOFT) tablet 50 mg  50 mg Oral Daily Cobos, Rockey Situ, MD   50 mg at 09/30/16 1191    PTA Medications: Prescriptions Prior to Admission  Medication Sig Dispense Refill Last Dose  . clonazePAM (KLONOPIN) 1 MG tablet Take 0.5-1 tablets (0.5-1 mg total) by mouth 2 (two) times daily as needed for anxiety. (Patient taking differently: Take 0.5 mg by mouth 2 (two) times daily as needed for anxiety. ) 45 tablet 0 Past Week at Unknown time  . escitalopram (LEXAPRO) 20 MG tablet Take 1 tablet (20 mg total) by mouth daily. 30 tablet 1 Past Week at Unknown time  . rizatriptan (MAXALT-MLT) 10 MG disintegrating tablet Take 1 tablet (10 mg total) by mouth as needed for migraine. May repeat in 2 hours if needed 8 tablet 11 Past Month at Unknown time    Treatment Modalities: Medication Management, Group therapy, Case  management,  1 to 1 session with clinician, Psychoeducation, Recreational therapy.  Patient Stressors: Paediatric nurse issue Marital or family conflict Patient Strengths: Ability for insight Active sense of humor Average or above average intelligence Capable of independent living Communication skills  Physician Treatment Plan for Primary Diagnosis: Severe major depression without psychotic features (HCC) Long Term Goal(s): Improvement in symptoms so as ready for discharge Short Term Goals: Ability to identify changes in lifestyle to reduce recurrence of condition will improve Ability to verbalize feelings will improve Ability to disclose and discuss suicidal ideas Ability to demonstrate self-control will improve Ability to identify and develop effective coping behaviors will improve Ability to maintain clinical measurements within normal limits will improve Compliance with prescribed medications will improve Ability to identify triggers associated with substance abuse/mental health issues will improve  Medication Management: Evaluate patient's response, side effects, and tolerance of medication regimen.  Therapeutic Interventions: 1 to 1 sessions, Unit Group sessions and Medication administration.  Evaluation of Outcomes: Adequate for Discharge  Physician Treatment Plan for Secondary Diagnosis: Principal Problem:   Severe major depression without psychotic features (HCC)  Long Term Goal(s): Improvement in symptoms so as ready for discharge  Short Term Goals: Ability to identify changes in lifestyle to reduce recurrence of condition will improve Ability to verbalize feelings will improve Ability to disclose and discuss suicidal ideas Ability to demonstrate self-control will improve Ability to  identify and develop effective coping behaviors will improve Ability to maintain clinical measurements within normal limits will improve Compliance with prescribed medications  will improve Ability to identify triggers associated with substance abuse/mental health issues will improve  Medication Management: Evaluate patient's response, side effects, and tolerance of medication regimen.  Therapeutic Interventions: 1 to 1 sessions, Unit Group sessions and Medication administration.  Evaluation of Outcomes: Adequate for Discharge  RN Treatment Plan for Primary Diagnosis: Severe major depression without psychotic features (HCC) Long Term Goal(s): Knowledge of disease and therapeutic regimen to maintain health will improve  Short Term Goals: Compliance with prescribed medications will improve  Medication Management: RN will administer medications as ordered by provider, will assess and evaluate patient's response and provide education to patient for prescribed medication. RN will report any adverse and/or side effects to prescribing provider.  Therapeutic Interventions: 1 on 1 counseling sessions, Psychoeducation, Medication administration, Evaluate responses to treatment, Monitor vital signs and CBGs as ordered, Perform/monitor CIWA, COWS, AIMS and Fall Risk screenings as ordered, Perform wound care treatments as ordered.  Evaluation of Outcomes: Adequate for Discharge  LCSW Treatment Plan for Primary Diagnosis: Severe major depression without psychotic features (HCC) Long Term Goal(s): Safe transition to appropriate next level of care at discharge, Engage patient in therapeutic group addressing interpersonal concerns. Short Term Goals: Engage patient in aftercare planning with referrals and resources, Increase ability to appropriately verbalize feelings, Identify triggers associated with mental health/substance abuse issues and Increase skills for wellness and recovery  Therapeutic Interventions: Assess for all discharge needs, 1 to 1 time with Social worker, Explore available resources and support systems, Assess for adequacy in community support network, Educate  family and significant other(s) on suicide prevention, Complete Psychosocial Assessment, Interpersonal group therapy.  Evaluation of Outcomes: Adequate for Discharge  Progress in Treatment: Attending groups: Yes Participating in groups: Yes Taking medication as prescribed: Yes, MD continues to assess for medication changes as needed Toleration medication: Yes, no side effects reported at this time Family/Significant other contact made: Yes, with pt's boyfriend. Patient understands diagnosis: Yes, AEB pt's willingness to participate in treatment. Discussing patient identified problems/goals with staff: Yes Medical problems stabilized or resolved: Yes Denies suicidal/homicidal ideation: Yes Issues/concerns per patient self-inventory: None Other: N/A  New problem(s) identified: None identified at this time.   New Short Term/Long Term Goal(s): None identified at this time.   Discharge Plan or Barriers: Pt will return home and [facility]  Reason for Continuation of Hospitalization:  None identified at this time.  Estimated Length of Stay: 0 days  Attendees: Patient: 09/30/2016 10:56 AM  Physician: Dr. Jama Flavorsobos 09/30/2016 10:56 AM  Nursing:  09/30/2016 10:56 AM  RN Care Manager: Onnie BoerJennifer Clark, RN 09/30/2016 10:56 AM  Social Worker: Donnelly StagerLynn Mekenzie Modeste, LCSWA; Vernie ShanksLauren Newton, LCSW 09/30/2016 10:56 AM  Recreational Therapist:  09/30/2016 10:56 AM  Other: Armandina StammerAgnes Nwoko, NP 09/30/2016 10:56 AM  Other:  09/30/2016 10:56 AM  Other: 09/30/2016 10:56 AM   Scribe for Treatment Team: Jonathon JordanLynn B Airik Goodlin, MSW,LCSWA 09/30/2016 10:56 AM

## 2016-09-30 NOTE — Discharge Summary (Signed)
Physician Discharge Summary Note  Patient:  Sandra Mills is an 56 y.o., female MRN:  [id number] DOB:  1960-10-19 Patient phone:  [phone] (home)  Patient address:   [address] [place] [address],  Total Time spent with patient: Greater than 30 minutes  Date of Admission:  09/26/2016 Date of Discharge: 09-30-16  Reason for Admission: Worsening symptoms of depression & suicidal ideations.  Principal Problem: Severe major depression without psychotic features [facility])  Discharge Diagnoses: Patient Active Problem List   Diagnosis Date Noted  . Severe major depression without psychotic features ([facility]) [F32.2] 09/26/2016  . Anxiety and depression [F41.9, F32.9] 06/23/2007  . TREMOR, ESSENTIAL [G25.0, G25.2] 06/23/2007  . Migraine, menstrual [G43.829] 06/23/2007   Past Psychiatric History: Major depressive disorder, recurrent,   Past Medical History:  Past Medical History:  Diagnosis Date  . Anxiety   . Colon polyps 12/09/12   three polyps; [facility] [place]; repeat in 3-5 years.  . IBS (irritable bowel syndrome)    diarrhea predominant; s/p GI consult with colonoscopy  . Migraine    menstrually related.  . Tremor, essential     Past Surgical History:  Procedure Laterality Date  . COLONOSCOPY W/ POLYPECTOMY  12/09/12   three polyps; [place]. Repeat in 3-5 years.  . Cyst resection perineal     Family History:  Family History  Problem Relation Age of Onset  . Breast cancer Mother 54  . Cancer Mother 64       Breast cancer age 60  . Atrial fibrillation Father   . Atrial fibrillation Sister   . Breast cancer Other        maternal great aunt dx in her 29s  . Breast cancer Other        6 maternal great aunts dx in their 72s   Family Psychiatric  History: See H&P  Social History:  History  Alcohol Use No     History  Drug Use No    Social History   Social History  . Marital status: Legally Separated    Spouse name: 11/2015  . Number of children: 4  . Years of  education: Master's   Occupational History  . [employer] (choir/voice)   Social History Main Topics  . Smoking status: Never Smoker  . Smokeless tobacco: Never Used  . Alcohol use No  . Drug use: No  . Sexual activity: Yes    Birth control/ protection: Surgical     Comment: Husband vasectomy   Other Topics Concern  . None   Social History Narrative   Marital status:  Married x 31 years, no abuse. Separated 11/2015. Dating.      Lives: Lives alone, children (1993, 1995, 1997, 1999) live with their father and visit her regulalry.  No grandchildren.      Children: 4 children; no grandchildren.      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  Previously worked [employer].   No tobacco/alcohol/drugs; wine three times per year.   Exercise sporadic; curves and [employer].   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 Patient has been faced with significant stresses related to her relationship with her husband who  told her he has never been attracted to her, and questions about his own sexuality, bankruptcy  secondary to financial difficulties, patien suffered stroke with right-sided paralysis, some difficulties at school and some difficulties from her son which she does not feel like talking about during my evaluation. Reportedly she had an argument with her boyfriend of 6 months and also reportedly sexually active does not want her medication to effect her sexual desire. Patient continued to endorse depression, generalized anxiety, disturbance of sleep and appetite.   After her admission to the hospital & after evaluation of her presenting  symptoms, Sandra Mills was started on medication regimen for her presenting symptoms. She received & was discharged on; Gabapentin 200 mg for agitation, Hydroxyzine 25 mg prn for anxiety & Sertraline 50 mg for depression. She was enrolled in the group counseling sessions & activities being offered & held on this unit. She participated & learned coping skills that should help her cope better after discharge to maintain safety & mood stability. She received no other medication regimen as she presented no other medical issues.  Sandra Mills attended treatment team meeting this am and met with the treatment team members. Her reason for admission, treatment plan and response to treatment plan discussed. She endorsed that her symptoms have improved & did respond well to her treatment regimen. She also agreed that she is stable for discharge to continue mental health care on an outpatient basis as noted below. She was provided with all the necessary information needed to make this appointment without problems.  Upon discharge, Sandra Mills denies any SIHI, delusional thoughts or paranoia. She [facility] with all personal belongings in no apparent distress. Transportation per her arrangement.  Physical Findings: AIMS: Facial and Oral Movements Muscles of Facial Expression: None, normal Lips and Perioral Area: None, normal Jaw: None, normal Tongue: None, normal,Extremity Movements Upper (arms, wrists, hands, fingers): None, normal Lower (legs, knees, ankles, toes): None, normal, Trunk Movements Neck, shoulders, hips: None, normal, Overall Severity Severity of abnormal movements (highest score from questions above): None, normal Incapacitation due to abnormal movements: None, normal Patient's awareness of abnormal movements (rate only patient's report): No Awareness, Dental Status Current problems with teeth and/or dentures?: Yes (One implant that is partial) Does patient usually wear dentures?: No  CIWA:    COWS:      Musculoskeletal: Strength & Muscle Tone: within normal limits Gait & Station: normal Patient leans: N/A  Psychiatric Specialty Exam: Physical Exam  Constitutional: She is oriented to person, place, and time. She appears well-developed and well-nourished.  HENT:  Head: Normocephalic.  Eyes: Pupils are equal, round, and reactive to light.  Neck: Normal range of motion.  Cardiovascular: Normal rate.   Respiratory: Effort normal.  GI: Soft.  Genitourinary:  Genitourinary Comments: Deferred  Musculoskeletal: Normal range of motion.  Neurological: She is alert and oriented to person, place, and time.  Skin: Skin is warm and dry.    Review of Systems  Constitutional: Negative.   HENT: Negative.   Eyes: Negative.   Respiratory: Negative.   Cardiovascular: Negative.   Gastrointestinal: Negative.   Genitourinary: Negative.   Musculoskeletal: Negative.   Skin: Negative.   Neurological: Negative.   Endo/Heme/Allergies: Negative.   Psychiatric/Behavioral: Positive for depression (Stable). Negative for hallucinations, memory loss, substance abuse and suicidal ideas. The patient has insomnia (Stable). The patient is not nervous/anxious.     Blood pressure 102/66, pulse (!) 103, temperature 98.9 F (37.2 C), temperature source Oral, resp. rate 18, height '5\' 3"'$  (1.6  m), weight 75.3 kg (166 lb), SpO2 100 %.Body mass index is 29.41 kg/m.  See Md's SRA   Have you used any form of tobacco in the last 30 days? (Cigarettes, Smokeless Tobacco, Cigars, and/or Pipes): No  Has this patient used any form of tobacco in the last 30 days? (Cigarettes, Smokeless Tobacco, Cigars, and/or Pipes): N/A  Blood Alcohol level:  Lab Results  Component Value Date   ETH <5 75/91/6384   Metabolic Disorder Labs:  Lab Results  Component Value Date   HGBA1C 5.3 09/27/2016   MPG 105 09/27/2016   MPG 108 01/08/2015   No results found for: PROLACTIN Lab Results  Component Value Date   CHOL 191 09/27/2016    TRIG 135 09/27/2016   HDL 56 09/27/2016   CHOLHDL 3.4 09/27/2016   VLDL 27 09/27/2016   LDLCALC 108 (H) 09/27/2016   LDLCALC 91 01/08/2015   See Psychiatric Specialty Exam and Suicide Risk Assessment completed by Attending Physician prior to discharge.  Discharge destination:  Home  Is patient on multiple antipsychotic therapies at discharge:  No   Has Patient had three or more failed trials of antipsychotic monotherapy by history:  No  Recommended Plan for Multiple Antipsychotic Therapies: NA  Allergies as of 09/30/2016   No Known Allergies     Medication List    STOP taking these medications   clonazePAM 1 MG tablet Commonly known as:  KLONOPIN   escitalopram 20 MG tablet Commonly known as:  LEXAPRO   rizatriptan 10 MG disintegrating tablet Commonly known as:  MAXALT-MLT     TAKE these medications     Indication  gabapentin 100 MG capsule Commonly known as:  NEURONTIN Take 2 capsules (200 mg total) by mouth 2 (two) times daily. For agitation  Indication:  Agitation   hydrOXYzine 25 MG tablet Commonly known as:  ATARAX/VISTARIL Take 1 tablet (25 mg total) by mouth every 6 (six) hours as needed for anxiety.  Indication:  Anxiety Neurosis   sertraline 50 MG tablet Commonly known as:  ZOLOFT Take 1 tablet (50 mg total) by mouth daily. For depression Start taking on:  10/01/2016  Indication:  Major Depressive Disorder      Follow-up Information    [facility] Follow up on 10/03/2016.   Why:  Hospital discharge follow up w you PCP on Friday June 8 at 9 AM.  Contact information: [address] [place] [address] [place] [address] [place] Follow up on 10/02/2016.   Specialty:  Behavioral Health Why:  Conprehensive clinical assessment on 6/7 at 11 AM.  Please arrive by 10:00 to complete necessary paperwork.  You will be referred for medications management, therapy and mental health intensive  outpatient services after assessment.   Contact information: [place] [id number] [place] [phone]         Follow-up recommendations: Activity:  As tolerated Diet: As recommended by your primary care doctor. Keep all scheduled follow-up appointments as recommended.   Comments: Patient is instructed prior to discharge to: Take all medications as prescribed by his/her mental healthcare provider. Report any adverse effects and or reactions from the medicines to his/her outpatient provider promptly. Patient has been instructed & cautioned: To not engage in alcohol and or illegal drug use while on prescription medicines. In the event of worsening symptoms, patient is instructed to call the crisis hotline, 911 and or go to the nearest ED  for appropriate evaluation and treatment of symptoms. To follow-up with his/her primary care provider for your other medical issues, concerns and or health care needs.   Signed: Encarnacion Slates, NP, PMHNP, FNP-BC 09/30/2016, 11:38 AM   Patient seen, Suicide Assessment Completed.  Disposition Plan Reviewed

## 2016-09-30 NOTE — Progress Notes (Signed)
  [facility] :  Will you be returning to the same living situation after discharge:  Yes,  pt returning home. At discharge, do you have transportation home?: Yes,  pt has access to transportation. Do you have the ability to pay for your medications: Yes,  prescriptions and samples provided.  Release of information consent forms completed and in the chart;  Patient's signature needed at discharge.  Patient to Follow up at: Follow-up Information    [facility] Follow up on 10/03/2016.   Why:  Hospital discharge follow up w you PCP on Friday June 8 at 9 AM.  Contact information: [address] [place] [phone]       BEHAVIORAL HEALTH OUTPATIENT THERAPY [place] Follow up on 10/02/2016.   Specialty:  Behavioral Health Why:  Conprehensive clinical assessment on 6/7 at 11 AM.  Please arrive by 10:00 to complete necessary paperwork.  You will be referred for medications management, therapy and mental health intensive outpatient services after assessment.   Contact information: [address] [id number] [place] [phone]          Next level of care provider has access to [facility] Link:yes  Safety Planning and Suicide Prevention discussed: Yes,  with pt and with pt's boyfriend.  Have you used any form of tobacco in the last 30 days? (Cigarettes, Smokeless Tobacco, Cigars, and/or Pipes): No  Has patient been referred to the Quitline?: N/A patient is not a smoker  Patient has been referred for addiction treatment: Yes  Sandra JordanLynn B Hassan Blackshire, MSW, LCSWA 09/30/2016, 11:01 AM

## 2016-09-30 NOTE — [facility] Group Notes (Signed)

## 2016-09-30 NOTE — Progress Notes (Signed)
D:  Patient's self inventory sheet, patient sleeps good, sleep medication helpful.  Good appetite, normal energy level, good concentration.  Rated depression, anxiety #6, hopeless 2.  Denied withdrawals.  Denied SI.  Denied physical problems.  Denied physical pain, no pain medication given.  Goal is be sure that all is in place for aftercare.  Plans to talk to SW and MD.  Does have discharge plans. A:  Medications administered per MD orders.  Emotional support and encouragement given patient. R:  Denied SI and HI, contracts for safety.  Denied A/V hallucinations.  Safety maintained with 15 minute checks.

## 2016-09-30 NOTE — Progress Notes (Signed)
Discharge Note:  Patient discharged home with family member.  Patient denied SI and HI.  Denied A/V hallucinations.  Denied pain.  Suicide prevention information given and discussed with patient who stated she understood and had no questions.  Patient stated she received all her belongings, clothing, toiletries, misc items, medications, prescriptions, keys, purse, wallet, DL, cards, cell phone and charger, jewelry, etc.  Patient stated she [facility].  All required discharge information given to patient at discharge.

## 2016-09-30 NOTE — BHH Suicide Risk Assessment (Signed)
[facility] Discharge Suicide Risk Assessment   Principal Problem: Severe major depression without psychotic features [facility]) Discharge Diagnoses:  Patient Active Problem List   Diagnosis Date Noted  . Severe major depression without psychotic features ([facility]) [F32.2] 09/26/2016  . Anxiety and depression [F41.9, F32.9] 06/23/2007  . TREMOR, ESSENTIAL [G25.0, G25.2] 06/23/2007  . Migraine, menstrual [G43.829] 06/23/2007    Total Time spent with patient: 30 minutes  Musculoskeletal: Strength & Muscle Tone: within normal limits Gait & Station: normal Patient leans: N/A  Psychiatric Specialty Exam: ROS denies headache, no nausea, no vomiting , no diarrhea  Blood pressure 102/66, pulse (!) 103, temperature 98.9 F (37.2 C), temperature source Oral, resp. rate 18, height 5\' 3"  (1.6 m), weight 75.3 kg (166 lb), SpO2 100 %.Body mass index is 29.41 kg/m.  General Appearance: Well Groomed  Eye Contact::  Good  Speech:  Normal Rate409  Volume:  Normal  Mood:  reports feeling "OK", denies feeling depressed at this time  Affect:  Appropriate and reactive   Thought Process:  Linear and Descriptions of Associations: Intact  Orientation:  Full (Time, Place, and Person)  Thought Content:  no hallucinations, no delusions  Suicidal Thoughts:  No denies any suicidal or self Injurious ideations, denies any homicidal or violent ideations   Homicidal Thoughts:  No  Memory:  recent and remote grossly intact   Judgement:  Other:  improving   Insight:  improving   Psychomotor Activity:  Normal  Concentration:  Good  Recall:  Good  Fund of Knowledge:Good  Language: Good  Akathisia:  Negative  Handed:  Right  AIMS (if indicated):     Assets:  Desire for Improvement Resilience  Sleep:  Number of Hours: 6.75  Cognition: WNL  ADL's:  Intact   Mental Status Per Nursing Assessment::   On Admission:  Self-harm thoughts  Demographic Factors:  56 year old female , separated, employed as Chartered loss adjuster.    Loss Factors: Separation,  financial stressors with ex husband  Historical Factors: No prior psychiatric admissions, no history of suicide attempts   Risk Reduction Factors:   Responsible for children under 54 years of age, Sense of responsibility to family, Employed and Positive coping skills or problem solving skills  Continued Clinical Symptoms:  At this time patient is alert, attentive,well related, mood is improved, presents euthymic, with full range of affect, no thought disorder, no suicidal or self injurious ideations , no hallucinations, no delusions, future oriented. Behavior on unit calm and in good control. Denies medication side effects. Side effects discussed .  Cognitive Features That Contribute To Risk:  No gross cognitive deficits noted upon discharge. Is alert , attentive, and oriented x 3   Suicide Risk:  Mild:  Suicidal ideation of limited frequency, intensity, duration, and specificity.  There are no identifiable plans, no associated intent, mild dysphoria and related symptoms, good self-control (both objective and subjective assessment), few other risk factors, and identifiable protective factors, including available and accessible social support.  Follow-up Information    [facility] Follow up on 10/03/2016.   Why:  Hospital discharge follow up w you PCP on Friday June 8 at 9 AM.  Contact information: [address] [place] [phone]       BEHAVIORAL HEALTH OUTPATIENT THERAPY [place] Follow up on 10/02/2016.   Specialty:  Behavioral Health Why:  Conprehensive clinical assessment on 6/7 at 11 AM.  Please arrive by 10:00 to complete necessary paperwork.  You will be referred for medications  management, therapy and mental health intensive outpatient services after assessment.   Contact information: [address] [phone] mc [place] [phone]          Plan Of  Care/Follow-up recommendations:  Activity:  as tolerated  Diet:  regular Tests:  NA Other:  see below  Patient is requesting discharge- there are no current grounds for involuntary commitment  She is leaving unit in good spirits. Plans to return home. Follow up as above   Craige CottaFernando A Ludger Bones, MD 09/30/2016, 11:51 AM

## 2016-10-02 ENCOUNTER — Encounter (HOSPITAL_COMMUNITY): Payer: Self-pay | Admitting: Licensed Clinical Social Worker

## 2016-10-02 ENCOUNTER — Ambulatory Visit (INDEPENDENT_AMBULATORY_CARE_PROVIDER_SITE_OTHER): Payer: BC Managed Care – PPO | Admitting: Licensed Clinical Social Worker

## 2016-10-02 DIAGNOSIS — F322 Major depressive disorder, single episode, severe without psychotic features: Secondary | ICD-10-CM
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
## 2016-10-03 ENCOUNTER — Ambulatory Visit (INDEPENDENT_AMBULATORY_CARE_PROVIDER_SITE_OTHER): Payer: BC Managed Care – PPO | Admitting: Physician Assistant

## 2016-10-03 ENCOUNTER — Encounter: Payer: Self-pay | Admitting: Physician Assistant

## 2016-10-03 VITALS — BP 115/73 | HR 80 | Temp 98.2°F | Resp 16 | Ht 63.0 in | Wt 167.0 lb

## 2016-10-03 DIAGNOSIS — F329 Major depressive disorder, single episode, unspecified: Secondary | ICD-10-CM | POA: Diagnosis not present

## 2016-10-03 DIAGNOSIS — F322 Major depressive disorder, single episode, severe without psychotic features: Secondary | ICD-10-CM

## 2016-10-03 DIAGNOSIS — F419 Anxiety disorder, unspecified: Secondary | ICD-10-CM | POA: Diagnosis not present

## 2016-10-03 DIAGNOSIS — F32A Depression, unspecified: Secondary | ICD-10-CM

## 2016-10-03 NOTE — Progress Notes (Signed)
Patient ID: Sandra NorseDonna F Mills, female    DOB: 06/10/1960, 56 y.o.   MRN: [id number]  PCP: Porfirio OarJeffery, Roza Creamer, PA-C  Chief Complaint  Patient presents with  . Follow-up    Pt on new medications, depression, was hospitalized    Subjective:   Presents for evaluation of depression.  I saw her on 5/07 with increasing depression symptoms, following life stressors. Over the past year she has separated from her husband of 20 years who has questions about his sexuality, and recently told her that he had never been sexually attracted to her. Work has been very difficult, and finances are tight. Then her father and his brother had strokes on the same day. Her uncle died several days later. Her father survived, and was back at home, but she had been there helping to care for him and her mother. Her absence created strain on her romantic relationship and she was having some suicidal ideations without intention or plan. She had been on various anti-depressants in the past, and felt like the venlafaxine she was on was responsible for dry mouth, oral irritation and decrease in libido. We started treatment with citalopram and contracted for safety.  On 09/26/2016 she presented to the ED with increasing suicidal ideations. She was concerned that she was thinking of various plans-driving her car into traffic, overdosing on pills (she had given me her bottle of ibuprofen at the visit on 5/07 to reduce the risk). She was admitted to behavioral health.  She was discharged 09/30/2016, on gabapentin, hydroxyzine and sertraline. Seems to be tolerating the medications well. She is feeling better overall. Not experiencing suicidal ideations. Some anxiety about what happens next. Her intensive outpatient program starts Wednesday, 10/08/2016, with daily group sessions 9a-12 noon daily x 12-14 sessions, then 1:1 therapy.  Is out of work, will not return before the last day (10/09/2016). Will need FMLA forms  completed.  Lots of mouth dryness. It has been suggested that she may have Sjogren's syndrome and desires laboratory evaluation.   Review of Systems As above.    Patient Active Problem List   Diagnosis Date Noted  . Severe major depression without psychotic features (HCC) 09/26/2016  . Anxiety and depression 06/23/2007  . TREMOR, ESSENTIAL 06/23/2007  . Migraine, menstrual 06/23/2007     Prior to Admission medications   Medication Sig Start Date End Date Taking? Authorizing Provider  gabapentin (NEURONTIN) 100 MG capsule Take 2 capsules (200 mg total) by mouth 2 (two) times daily. For agitation 09/30/16  Yes Armandina StammerNwoko, Agnes I, NP  hydrOXYzine (ATARAX/VISTARIL) 25 MG tablet Take 1 tablet (25 mg total) by mouth every 6 (six) hours as needed for anxiety. 09/30/16  Yes Armandina StammerNwoko, Agnes I, NP  sertraline (ZOLOFT) 50 MG tablet Take 1 tablet (50 mg total) by mouth daily. For depression 10/01/16  Yes Armandina StammerNwoko, Agnes I, NP     No Known Allergies     Objective:  Physical Exam  Constitutional: She is oriented to person, place, and time. She appears well-developed and well-nourished. She is active and cooperative. No distress.  BP 115/73   Pulse 80   Temp 98.2 F (36.8 C) (Oral)   Resp 16   Ht 5\' 3"  (1.6 m)   Wt 167 lb (75.8 kg)   SpO2 99%   BMI 29.58 kg/m   HENT:  Head: Normocephalic and atraumatic.  Right Ear: Hearing normal.  Left Ear: Hearing normal.  Eyes: Conjunctivae are normal. No scleral icterus.  Neck: Normal range  of motion. Neck supple. No thyromegaly present.  Cardiovascular: Normal rate, regular rhythm and normal heart sounds.   Pulses:      Radial pulses are 2+ on the right side, and 2+ on the left side.  Pulmonary/Chest: Effort normal and breath sounds normal.  Lymphadenopathy:       Head (right side): No tonsillar, no preauricular, no posterior auricular and no occipital adenopathy present.       Head (left side): No tonsillar, no preauricular, no posterior auricular and  no occipital adenopathy present.    She has no cervical adenopathy.       Right: No supraclavicular adenopathy present.       Left: No supraclavicular adenopathy present.  Neurological: She is alert and oriented to person, place, and time. No sensory deficit.  Increased tremor, involving hands and head.  Skin: Skin is warm, dry and intact. No rash noted. No cyanosis or erythema. Nails show no clubbing.  Psychiatric: Her speech is normal and behavior is normal. Judgment and thought content normal. Her mood appears anxious. Her affect is blunt. Her affect is not angry, not labile and not inappropriate. Cognition and memory are normal. She exhibits a depressed mood.           Assessment & Plan:   Problem List Items Addressed This Visit    Anxiety and depression - Primary    Continue gabapentin, sertraline and hydroxyzine. Proceed with IOP.      Severe major depression without psychotic features (HCC)    Continue on current medications. Psychiatrist alert to dry mouth, and labs performed to evaluate for connective tissue disorder, specifically Sjogren's syndrome. Proceed with IOP.          No Follow-up on file.   Fernande Bras, PA-C Primary Care at [facility]

## 2016-10-03 NOTE — Patient Instructions (Addendum)
We recommend that you schedule a mammogram for breast cancer screening. Typically, you do not need a referral to do this. Please contact a local imaging center to schedule your mammogram.  [facility] - [phone]) [phone]  *ask for the Radiology Department [facility]) - [phone]) [phone] or [phone]) [phone]  [facility] - [phone](336) [phone] [facility] - [phone]) [phone] [facility] - [phone]) [phone]  *ask for the Radiology Department [facility] - [phone]) [phone]  *ask for the Radiology Department [facility] - [phone]) [phone]  *ask for the Mammography Department [facility] - [phone]) [phone]    IF you received an x-ray today, you will receive an invoice from [facility]. Please contact [facility] at [phone] with questions or concerns regarding your invoice.   IF you received labwork today, you will receive an invoice from [facility]. Please contact [facility] at [phone] with questions or concerns regarding your invoice.   Our billing staff will not be able to assist you with questions regarding bills from these companies.  You will be contacted with the lab results as soon as they are available. The fastest way to get your results is to activate your My Chart account. Instructions are located on the last page of this paperwork. If you have not heard from us regarding the results in 2 weeks, please contact this office.    Ailene ArdsJenny Lawson Furiously Happy: a Funny book about Terrible Things Will [place] (speech about anxiety and depression)  For dryness: Stay well hydrated. 64 ounces of water a day. Bathe once each day, with tepid water. Minimal soap (only in the smelly bits). Lubricate your skin and lips-Vaseline, cocoa butter, Moisturel/Lubriderm/Aquaphor/Cetaphil.

## 2016-10-08 ENCOUNTER — Other Ambulatory Visit (HOSPITAL_COMMUNITY): Payer: BC Managed Care – PPO | Attending: Psychiatry | Admitting: Psychiatry

## 2016-10-08 ENCOUNTER — Encounter (HOSPITAL_COMMUNITY): Payer: Self-pay | Admitting: Psychiatry

## 2016-10-08 DIAGNOSIS — F332 Major depressive disorder, recurrent severe without psychotic features: Secondary | ICD-10-CM | POA: Diagnosis not present

## 2016-10-08 DIAGNOSIS — M3500 Sicca syndrome, unspecified: Secondary | ICD-10-CM | POA: Insufficient documentation

## 2016-10-08 DIAGNOSIS — K589 Irritable bowel syndrome without diarrhea: Secondary | ICD-10-CM | POA: Diagnosis not present

## 2016-10-08 DIAGNOSIS — Z79899 Other long term (current) drug therapy: Secondary | ICD-10-CM | POA: Diagnosis not present

## 2016-10-08 DIAGNOSIS — G250 Essential tremor: Secondary | ICD-10-CM | POA: Diagnosis not present

## 2016-10-08 DIAGNOSIS — F419 Anxiety disorder, unspecified: Secondary | ICD-10-CM | POA: Diagnosis not present

## 2016-10-08 MED ORDER — SERTRALINE HCL 100 MG PO TABS: 100.0000 mg | ORAL_TABLET | Freq: Every day | ORAL | 0 refills | Status: DC

## 2016-10-08 NOTE — Patient Instructions (Signed)
[facility] [phone] [address]  [place], [place] [address]     Sjogren Syndrome Sjogren syndrome is a disease in which the body's disease-fighting system (immune system) attacks the glands that produce tears (lacrimal glands) and the glands that produce saliva (salivary glands). This makes the eyes and mouth very dry. Sjogren syndrome is a long-term (chronic) disorder that has no cure. In some cases, it is linked to other disorders (rheumatic disorders), such as rheumatoid arthritis and systemic lupus erythematosus (SLE). It may affect other parts of the body, such as:  Kidneys.  Blood vessels.  Joints.  Lungs.  Liver.  Pancreas.  Brain.  Nerves.  Spinal cord.  What are the causes? The cause of this condition is not known. It may be passed along from parent to child (inherited), or it may be a symptom of a rheumatic disorder. What increases the risk? This condition is more likely to develop in:  Women.  People who are 57-63 years old.  People who have recently had a viral infection or currently have a viral infection.  What are the signs or symptoms? The main symptoms of this condition are:  Dry mouth. This may include: ? A chalky feeling. ? Difficulty swallowing, speaking, or tasting. ? Frequent cavities in teeth. ? Frequent mouth infections.  Dry eyes. This may include: ? Burning, redness, and itching. ? Blurry vision. ? Light sensitivity.  Other symptoms may include:  Dryness of the skin and the inside of the nose.  Eyelid infections.  Vaginal dryness, if this applies.  Joint pain and stiffness.  Muscle pain and stiffness.  How is this diagnosed? This condition is diagnosed based on:  Your symptoms.  Your medical history.  A physical exam of your eyes and mouth.  You may have tests, including:  Schirmer test. This tests your tear production.  An eye exam that is done with a magnifying device (slit-lamp exam).  An  eye test that temporarily stains your eye with dye. This shows the extent of eye damage.  Tests to check your salivary gland function.  Biopsy. This is a removal of part of a salivary gland from inside your lower lip to be studied under a microscope.  Chest X-rays.  Blood tests.  Urine tests.  How is this treated? There is no cure for this condition, but treatment can help you manage your symptoms. Treatment options may include:  Moisture replacement therapies to help relieve dryness in your skin, mouth, and eyes.  NSAIDs to help relieve pain and stiffness.  Medicines to help relieve inflammation in your body(corticosteroids). These are usually for severe cases.  Medicines to help reduce the activity of your immune system (immunosuppressants).  Surgery or insertion of plugs to close the lacrimal glands (punctal occlusion). Thishelps keep more natural tears in your eyes.  Follow these instructions at home:  Take over-the-counter and prescription medicines only as told by your health care provider.  Take these actions to care for your eyes: ? Use eye drops as told by your health care provider. ? Blink at least 5-6 times a minute. ? Protect your eyes from drafts and breezes. ? Maintain properly humidified air. You may want to use a humidifier at home. ? Avoid smoke.  Take these actions to care for your mouth: ? Brush your teeth and floss after every meal. ? Chew sugar-free gum or suck on hard candy. For some people, this can help to relieve dry mouth. ? Use antimicrobial mouthwash daily. ? Take frequent  sips of water or sugar-free drinks. ? Use saliva substitutes or lip balm as told by your health care provider.  Drink enough fluid to keep your urine clear or pale yellow.  Schedule and attend dentist visits every six months.  Keep all follow-up visits as told by your health care provider. This is important. Contact a health care provider if:  You have a fever.  You  have night sweats.  You are always tired.  You have unexplained weight loss.  You develop itchy skin.  You have red patches on your skin.  You have a lump or swelling on your neck. This information is not intended to replace advice given to you by your health care provider. Make sure you discuss any questions you have with your health care provider. Document Released: 04/04/2002 Document Revised: 12/09/2015 Document Reviewed: 12/21/2014 Elsevier Interactive Patient Education  [employer]2018 [employer]

## 2016-10-08 NOTE — Progress Notes (Signed)
Psychiatric Initial Adult Assessment   Patient Identification: Sandra Mills MRN:  [id number] Date of Evaluation:  10/08/2016 Referral Source: IOP Chief Complaint:  depression Visit Diagnosis:    ICD-10-CM   1. Sjogren's syndrome, with unspecified organ involvement ([place]) M35.00 Ambulatory referral to Dermatology  2. Severe episode of recurrent major depressive disorder, without psychotic features (HCC) F33.2 sertraline (ZOLOFT) 100 MG tablet   History of Present Illness:  Sandra Mills is a 56 year old female with a psychiatric history of major depressive disorder and medical history of IBS and a benign essential tremor. She presents today for a IOP assessment in the context of recent family stressors, in addition to financial stressors. She reports that she and her husband of 31 years have separated as of August 2017, due to issues of her husband's gender and sexuality, and due to issues of growing apart both physically and emotionally over the past 2-3 decades. They stayed married largely to maintain the nuclear family for the children.  She reports that the most significant stressor recently has been her father who had 2 strokes in the last 6 weeks. She reports that she is very close with her parents and with her sister, and it was very difficult to watch her father go through this, and the resulting deficits. She reports that this, compiled with the financial stressors from her ex-husband's irresponsible spending, have worsened her depression symptoms.  She was in the inpatient psychiatric ward at behavioral health approximately 2 weeks ago, and was discharged on June 5. She was therefore proximally 5 days, and initiated on Zoloft, gabapentin, and Vistaril. She reports that she is no longer feeling suicidal, like she had been when she was admitted. She reports that she wants to live and feel better for her 4 children, and she also is in a new relationship of approximately 6-7 months with  a boyfriend. 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  She reports that her mood continues to be mildly depressed and anxious, and she has trouble with using her coping skills when she is feeling particularly down and depressed. She is hopeful she will learn how to be able to cope with the stressors in the IOP program. We also discussed increasing her Zoloft to 100 mg daily, and discontinuing gabapentin 100 mg twice daily and discontinuing Vistaril 25 mg.  Spent time discussing that I'd like her to be on a simple regimen with a adequate dose of antidepressant, and she was very much agreeable to this.  Medically, she reports that she is fairly physically healthy. She does struggle with significant dry eyes, itching, stinging, and reports that she also struggles with dry mouth. She will reports that this has worsened substantially over the past 3 years, and prior to her 42s she never struggled with these sorts of symptoms. She also struggles with dryness of her genitals. She does have a family history of autoimmune thyroid disease. I reviewed her recent labs with her, which were normal. She has never had any labs to test for autoimmune illness, sicca syndrome, Sjogren's, or other rheumatologic disease.  I placed a dermatology consult, and recommended that the patient also follow-up with her primary care provider for additional labs and investigation.  Associated Signs/Symptoms: Depression Symptoms:  depressed mood, anhedonia, psychomotor retardation, feelings of worthlessness/guilt, difficulty concentrating, (Hypo) Manic Symptoms:  none Anxiety Symptoms:  none Psychotic Symptoms:  none PTSD Symptoms: Negative  Past Psychiatric History: She has 1 prior psychiatric hospitalization about 2 weeks ago.  No other significant episodes of depression, suicidality or hospitalizations  Previous Psychotropic Medications: Yes   Substance Abuse History in the last 12 months:  No.  Consequences of  Substance Abuse: Negative  Past Medical History:  Past Medical History:  Diagnosis Date  . Anxiety   . Colon polyps 12/09/12   three polyps; [place]; repeat in 3-5 years.  . IBS (irritable bowel syndrome)    diarrhea predominant; s/p GI consult with colonoscopy  . Migraine    menstrually related.  . Tremor, essential     Past Surgical History:  Procedure Laterality Date  . COLONOSCOPY W/ POLYPECTOMY  12/09/12   three polyps; [place]. Repeat in 3-5 years.  . Cyst resection perineal      Family Psychiatric History: Family psychiatric history of anxiety and depression. Her sister takes Effexor with good effect  Family History:  Family History  Problem Relation Age of Onset  . Breast cancer Mother 99  . Cancer Mother 79       Breast cancer age 24  . Atrial fibrillation Father   . Atrial fibrillation Sister   . Breast cancer Other        maternal great aunt dx in her 41s  . Breast cancer Other        6 maternal great aunts dx in their 17s    Social History:   Social History   Social History  . Marital status: Legally Separated    Spouse name: 11/2015  . Number of children: 4  . Years of education: Master's   Occupational History  . [employer] (choir/voice)   Social History Main Topics  . Smoking status: Never Smoker  . Smokeless tobacco: Never Used  . Alcohol use No  . Drug use: No  . Sexual activity: Yes    Birth control/ protection: Surgical     Comment: Husband vasectomy   Other Topics Concern  . Not on file   Social History Narrative   Marital status:  Married x 31 years, no abuse. Separated 11/2015. Dating.      Lives: Lives alone, children (1993, 1995, 1997, 1999) live with their father and visit her regulalry.  No grandchildren.      Children: 4 children; no grandchildren.      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  Previously worked [employer].   No tobacco/alcohol/drugs; wine three times per year.   Exercise sporadic;  curves and [employer].   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      Additional Social History: The patient is a Investment banker, corporate at [employer]. She is separated from her husband since August 2017, and is now dating a man for about 6 months. She has 4 children who are all in college and lives at home  Allergies:  No Known Allergies  Metabolic Disorder Labs: Lab Results  Component Value Date   HGBA1C 5.3 09/27/2016   MPG 105 09/27/2016   MPG 108 01/08/2015   No results found for: PROLACTIN Lab Results  Component Value Date   CHOL 191 09/27/2016   TRIG 135 09/27/2016   HDL 56 09/27/2016   CHOLHDL 3.4 09/27/2016   VLDL 27 09/27/2016   LDLCALC 108 (H) 09/27/2016   LDLCALC 91 01/08/2015     Current Medications: Current Outpatient Prescriptions  Medication Sig Dispense Refill  . sertraline (ZOLOFT) 100 MG tablet Take 1 tablet (100 mg total) by mouth daily. For depression 90 tablet 0  No current facility-administered medications for this visit.     Neurologic: Headache: Negative Seizure: Negative Paresthesias:Negative  Musculoskeletal: Strength & Muscle Tone: within normal limits Gait & Station: normal Patient leans: N/A  Psychiatric Specialty Exam: ROS  There were no vitals taken for this visit.There is no height or weight on file to calculate BMI.  General Appearance: Casual and Fairly Groomed  Eye Contact:  Good  Speech:  Clear and Coherent  Volume:  Normal  Mood:  Dysphoric  Affect:  Congruent  Thought Process:  Goal Directed  Orientation:  Full (Time, Place, and Person)  Thought Content:  Logical  Suicidal Thoughts:  No  Homicidal Thoughts:  No  Memory:  Immediate;   Good  Judgement:  Good  Insight:  Good  Psychomotor Activity:  Normal  Concentration:  Concentration: Good  Recall:  Good  Fund of Knowledge:Good  Language: Good  Akathisia:  Negative  Handed:  Right  AIMS (if indicated):  0  Assets:  Communication Skills Desire for  Improvement Financial Resources/Insurance Housing Intimacy Leisure Time Physical Health Resilience Social Support Talents/Skills Transportation Vocational/Educational  ADL's:  Intact  Cognition: WNL  Sleep:  7-9 hours    Treatment Plan Summary: JAYA LAPKA is a 56 year old female with a history of major depressive disorder, in addition to IBS, who presents today for a assessment to participate in the IOP program. She presents with a significant worsening of her depression in the setting of separation/divorce, her father struggling with 2 strokes, significant financial strain from her ex-husband's reckless spending, and baseline disposition towards depression. She had suicidal thoughts resulting in brief 5 day psychiatric hospitalization, and now will be participating in the IOP. I believe she would benefit from the additional support, and the attention to skill building and coping strategies that the IOP can offer. I believe she would benefit from and titration of her Zoloft to 100 mg, and we will remove any other low-dose medications, to have a clean idea of whether she is having appropriate benefits from SSRI.  She also struggles with significant dryness, and I'm concerned about Sjogren's, and I would like to remove any medications that would potentially contribute to dryness such as gabapentin or Vistaril.  I have made a referral to dermatology, and we'll forward this note to her PCP as well.  1. Sjogren's syndrome, with unspecified organ involvement ([place])   2. Severe episode of recurrent major depressive disorder, without psychotic features ([place])    Increase Zoloft to 100 mg daily Discontinue Vistaril and gabapentin The patient would be appropriate and would benefit from participation in IOP Patient will follow-up with this writer for psychiatric medication management  I will forward this note to her primary care provider, and asked that they coordinate with the patient to  schedule medical follow-up for any necessary laboratory studies; she may benefit from checking a rheumatoid factor, ANA, complement levels (C3 and C4), anti-Ro and La antibodies, ESR, CRP  Aundra Dubin, MD 6/13/201811:09 AM

## 2016-10-08 NOTE — Progress Notes (Signed)
    Daily Group Progress Note  Program: IOP  Group Time: 9:00-12:00  Participation Level: Active  Behavioral Response: Appropriate  Type of Therapy:  Group Therapy  Summary of Progress: Pt.'s first day in group. Pt. Met with the case manager and the psychiatrist. Pt. Introduced herself to the group and stated that she has been dealing with generalized anxiety and that the group was a challenge for her. Pt. Warmed up to the group and connected with other group members around topic of developing healthy relationships. Participated in discussion about social media and how to identify how facebook etc. Are contributing to positive mental health and wellness and to set parameters and boundaries on its use such as removing self from FB or blocking or unfriending those who are not immediate family or friends.       Nancie Neas, LPC

## 2016-10-08 NOTE — Progress Notes (Signed)
Comprehensive Clinical Assessment (CCA) Note  10/08/2016 Sandra Mills [id number]  Visit Diagnosis:      ICD-10-CM   1. Sjogren's syndrome, with unspecified organ involvement (HCC) M35.00 Ambulatory referral to Dermatology  2. Severe episode of recurrent major depressive disorder, without psychotic features (HCC) F33.2 sertraline (ZOLOFT) 100 MG tablet      CCA Part One  Part One has been completed on paper by the patient.  (See scanned document in Chart Review)  CCA Part Two A  Intake/Chief Complaint:  CCA Intake With Chief Complaint Chief Complaint/Presenting Problem: Pt presents for worsening depression and suicidal ideation, anxiety. She was recently discharged from [facility] (LOS 5 days).  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 She is on fmla, however school has ended for the summer as of 10/07/2016. She is separated from her husband of 31 years  and will be divorced in August. He has sexual identity issues. She has 3 children who are in college obtiaing their second degree. Her youngest child just graduated from [employer]. and will be going to [employer]. Husband is a Customer service manager at [employer].  Patients Currently Reported Symptoms/Problems: anxiety, depression, suicidal ideation, father had a 2 cva within 6 weeks apart.  Collateral Involvement: She has experienced depression before which was managed by her PCP Leotis Shames. She is currently seperated from her husband of 31 years, and recently filed bankruptcy due to $130k debt.  She feels respsonible for his actions, for not overseeing his finances and being more proactive. She reports that they have tried counseling and due to sexual identity not able to continue on with the marriage. She notes that she is a strong faith based person and really wanted the marriage to work out.   Individual's Strengths: Motivated, children are successful,  employed by school system, new relationship of 7 months, transportation, and resilience.  Individual's Preferences: prefers to work through her mental illness, find ways to cope with depression and anxiety, medication mangement, and return to normal state of mental health and well being.  Individual's Abilities: ability to work a Marketing executive Type of Services Patient Feels Are Needed: intensive outpatient program, medication management  Mental Health Symptoms Depression:  Depression: Change in energy/activity, Difficulty Concentrating, Fatigue, Worthlessness, Tearfulness, Sleep (too much or little), Irritability  Mania:     Anxiety:   Anxiety: Difficulty concentrating, Fatigue, Restlessness, Sleep, Tension, Worrying  Psychosis:     Trauma:  Trauma: Avoids reminders of event, Detachment from others, Irritability/anger, Emotional numbing, Guilt/shame  Obsessions:     Compulsions:     Inattention:     Hyperactivity/Impulsivity:     Oppositional/Defiant Behaviors:     Borderline Personality:  Emotional Irregularity: Chronic feelings of emptiness, Frantic efforts to avoid abandonment, Unstable self-image  Other Mood/Personality Symptoms:      Mental Status Exam Appearance and self-care  Stature:  Stature: Average  Weight:  Weight: Overweight  Clothing:  Clothing: Casual, Neat/clean  Grooming:  Grooming: Normal  Cosmetic use:  Cosmetic Use: Age appropriate  Posture/gait:  Posture/Gait: Normal  Motor activity:  Motor Activity: Not Remarkable (essential tremors)  Sensorium  Attention:  Attention: Normal  Concentration:  Concentration: Normal  Orientation:  Orientation: Object, Person, Place, Situation, Time  Recall/memory:  Recall/Memory: Normal  Affect and Mood  Affect:  Affect: Appropriate (congruent)  Mood:  Mood: Depressed, Anxious (down side but better)  Relating  Eye contact:  Eye  Contact: Normal  Facial expression:  Facial Expression: Anxious  Attitude toward examiner:   Attitude Toward Examiner: Cooperative (engaging well )  Thought and Language  Speech flow: Speech Flow: Normal  Thought content:  Thought Content: Appropriate to mood and circumstances  Preoccupation:  Preoccupations: Guilt  Hallucinations:  Hallucinations:  (denies)  Organization:     Company secretary of Knowledge:  Fund of Knowledge: Average  Intelligence:  Intelligence: Above Average  Abstraction:  Abstraction: Normal  Judgement:  Judgement: Normal  Reality Testing:  Reality Testing: Realistic  Insight:  Insight: Good  Decision Making:  Decision Making: Normal  Social Functioning  Social Maturity:  Social Maturity: Responsible  Social Judgement:  Social Judgement: Normal  Stress  Stressors:  Stressors: Family conflict, Grief/losses, Housing, Arts administrator, Work, Transitions  Coping Ability:  Coping Ability: Deficient supports, Designer, jewellery, Science writer, Engineer, agricultural Deficits:     Supports:      Family and Psychosocial History: Family history Marital status: Separated Separated, when?: 12/25/2015 What types of issues is patient dealing with in the relationship?: Financial issues, children,  Additional relationship information: seperated at this time, unable to file divorce as it has not been a year. She started dating about 4 months after the seperation.  Are you sexually active?: Yes What is your sexual orientation?: heterosexual Has your sexual activity been affected by drugs, alcohol, medication, or emotional stress?: Denies Does patient have children?: Yes How many children?: 4 How is patient's relationship with their children?: 18yo, 20yo, 22yo, 24yo - is very close to all her children.  Childhood History:  Childhood History By whom was/is the patient raised?: Both parents Additional childhood history information: mother was "a lot, rough" but moody and mean and didn't seek mental health services Description of patient's relationship with caregiver when they were a  child: Really close to both parents, but mother would be moody, thought she had bipolar disorder. Patient's description of current relationship with people who raised him/her: Father - "my favorite person in the whole universe."  Stepmother - very good.  Mother - deceased. How were you disciplined when you got in trouble as a child/adolescent?: Verbally abused, spanked Does patient have siblings?: Yes Number of Siblings: 1 Description of patient's current relationship with siblings: very close relationship with sister who lives in [place] Did patient suffer any verbal/emotional/physical/sexual abuse as a child?: Yes Did patient suffer from severe childhood neglect?: No Has patient ever been sexually abused/assaulted/raped as an adolescent or adult?: No Was the patient ever a victim of a crime or a disaster?: No Witnessed domestic violence?: No Has patient been effected by domestic violence as an adult?: No  CCA Part Two B  Employment/Work Situation: Employment / Work Psychologist, occupational Employment situation: Employed Where is patient currently employed?: [employer]) and Architectural technologist church (asst Geneticist, molecular)  How long has patient been employed?: 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 Has patient ever been in the [employer]?: No Has patient ever served in combat?: No Did You Receive Any Psychiatric Treatment/Services While in Equities trader?: No Are There Guns or Other Weapons in Your Home?: No  Education: Engineer, civil (consulting) Currently Attending: [employer]  graduate Last Grade Completed:  (Masters) Did Garment/textile technologistYou Graduate From [employer] School?: Yes Did  Theme park managerYou Attend College?: Yes What Type of College Degree Do you Have?: Masters  Did You Attend Graduate School?: Yes What is Your Occupational psychologistost Graduate Degree?: Music education What Was Your Major?: Music Did You Have Any Special Interests In School?: music education Did You Have An Individualized Education Program (IIEP): No Did You Have Any Difficulty At School?: No  Religion: Religion/Spirituality Are You A Religious Person?: Yes What is Your Religious Affiliation?: Presbyterian How Might This Affect Treatment?: None  Leisure/Recreation: Leisure / Recreation Leisure and Hobbies: hangs out with boyfriend, hang out with kids, plays with dog and music  Exercise/Diet: Exercise/Diet Do You Exercise?: Yes What Type of Exercise Do You Do?: Run/Walk How Many Times a Week Do You Exercise?: 1-3 times a week Have You Gained or Lost A Significant Amount of Weight in the Past Six Months?: Yes-Lost Number of Pounds Lost?: 40 Do You Follow a Special Diet?: Yes Type of Diet: low calorie Do You Have Any Trouble Sleeping?: No  CCA Part Two C  Alcohol/Drug Use: Alcohol / Drug Use Pain Medications: None Prescriptions: Zoloft, Gabapentin, and Vistaril Over the Counter: None reported History of alcohol / drug use?: No history of alcohol / drug abuse                      CCA Part Three  ASAM's:  Six Dimensions of Multidimensional Assessment  Dimension 1:  Acute Intoxication and/or Withdrawal Potential:     Dimension 2:  Biomedical Conditions and Complications:     Dimension 3:  Emotional, Behavioral, or Cognitive Conditions and Complications:     Dimension 4:  Readiness to Change:     Dimension 5:  Relapse, Continued use, or Continued Problem Potential:     Dimension 6:  Recovery/Living Environment:      Substance use Disorder (SUD) Substance Use Disorder (SUD)  Checklist Symptoms of Substance Use:  (NA)  Social Function:  Social Functioning Social Maturity: Responsible Social  Judgement: Normal  Stress:  Stress Stressors: Family conflict, Grief/losses, Housing, Arts administratorMoney, Work, Transitions Coping Ability: Deficient supports, Designer, jewelleryxhausted, Science writerverwhelmed, Resilient Patient Takes Medications The Way The Doctor Instructed?: Yes Priority Risk: Low Acuity  Risk Assessment- Self-Harm Potential: Risk Assessment For Self-Harm Potential Thoughts of Self-Harm: No current thoughts Method: No plan Availability of Means: No access/NA  Risk Assessment -Dangerous to Others Potential: Risk Assessment For Dangerous to Others Potential Method: No Plan Availability of Means: No access or NA Intent: Vague intent or NA Notification Required: No need or identified person  DSM5 Diagnoses: Patient Active Problem List   Diagnosis Date Noted  . Severe major depression without psychotic features (HCC) 09/26/2016  . Anxiety and depression 06/23/2007  . TREMOR, ESSENTIAL 06/23/2007  . Migraine, menstrual 06/23/2007    Patient Centered Plan: Patient is on the following Treatment Plan(s):  Depression  Recommendations for Services/Supports/Treatments: Recommendations for Services/Supports/Treatments Recommendations For Services/Supports/Treatments: IOP (Intensive Outpatient Program)  Treatment Plan Summary:  [facility] and oriented to attend group therapy along with educational groups to learn better coping skills. Will follow up with therapist and Dr. Gaspar SkeetersEskir for medication management.   Referrals to Alternative Service(s): Referred to Alternative Service(s):   Place:   Date:   Time:    Referred to Alternative Service(s):   Place:   Date:   Time:    Referred to Alternative Service(s):   Place:   Date:  Time:    Referred to Alternative Service(s):   Place:   Date:   Time:      Malachy Chamber, FNP-BC   [place], RITA, MED, CNA

## 2016-10-09 ENCOUNTER — Telehealth: Payer: Self-pay | Admitting: Physician Assistant

## 2016-10-09 ENCOUNTER — Other Ambulatory Visit (HOSPITAL_COMMUNITY): Payer: BC Managed Care – PPO | Admitting: Psychiatry

## 2016-10-09 ENCOUNTER — Encounter (HOSPITAL_COMMUNITY): Payer: Self-pay | Admitting: Psychiatry

## 2016-10-09 DIAGNOSIS — F332 Major depressive disorder, recurrent severe without psychotic features: Secondary | ICD-10-CM

## 2016-10-09 DIAGNOSIS — R682 Dry mouth, unspecified: Principal | ICD-10-CM

## 2016-10-09 DIAGNOSIS — M3500 Sicca syndrome, unspecified: Secondary | ICD-10-CM

## 2016-10-09 DIAGNOSIS — K117 Disturbances of salivary secretion: Secondary | ICD-10-CM

## 2016-10-09 NOTE — Telephone Encounter (Signed)
Pt would like to know if you could order test for Sjogren Syndrome and fungal issue in mouth. She states that at the last office visit with you that it was dicussed. She has seen her psychiatrist also about this Please advice [phone]. Thank you

## 2016-10-09 NOTE — Telephone Encounter (Signed)
Chelle, please see this message

## 2016-10-10 ENCOUNTER — Other Ambulatory Visit (HOSPITAL_COMMUNITY): Payer: BC Managed Care – PPO | Admitting: Psychiatry

## 2016-10-10 DIAGNOSIS — M3500 Sicca syndrome, unspecified: Secondary | ICD-10-CM | POA: Diagnosis not present

## 2016-10-10 MED ORDER — CLOTRIMAZOLE 10 MG MT TROC: 10.0000 mg | Freq: Every day | OROMUCOSAL | 0 refills | Status: DC

## 2016-10-10 NOTE — Telephone Encounter (Signed)
Future lab orders placed. Orders Placed This Encounter  Procedures  . Sjogren's syndrome antibods(ssa + ssb)    Standing Status:   Future    Standing Expiration Date:   11/09/2016  . Sedimentation rate    Standing Status:   Future    Standing Expiration Date:   11/09/2016  . Rheumatoid factor    Standing Status:   Future    Standing Expiration Date:   11/09/2016    Meds ordered this encounter  Medications  . clotrimazole (MYCELEX) 10 MG troche    Sig: Take 1 tablet (10 mg total) by mouth 5 (five) times daily.    Dispense:  30 tablet    Refill:  0    Order Specific Question:   Supervising Provider    Answer:   Clelia CroftSHAW, EVA N [[id number]]

## 2016-10-13 ENCOUNTER — Encounter: Payer: Self-pay | Admitting: Physician Assistant

## 2016-10-13 ENCOUNTER — Ambulatory Visit (INDEPENDENT_AMBULATORY_CARE_PROVIDER_SITE_OTHER): Payer: BC Managed Care – PPO | Admitting: Physician Assistant

## 2016-10-13 ENCOUNTER — Other Ambulatory Visit (HOSPITAL_COMMUNITY): Payer: BC Managed Care – PPO | Admitting: Psychiatry

## 2016-10-13 ENCOUNTER — Other Ambulatory Visit (INDEPENDENT_AMBULATORY_CARE_PROVIDER_SITE_OTHER): Payer: BC Managed Care – PPO | Admitting: Urgent Care

## 2016-10-13 VITALS — BP 108/74 | HR 87 | Temp 98.3°F | Resp 18 | Ht 62.91 in | Wt 167.8 lb

## 2016-10-13 DIAGNOSIS — K117 Disturbances of salivary secretion: Secondary | ICD-10-CM

## 2016-10-13 DIAGNOSIS — R682 Dry mouth, unspecified: Principal | ICD-10-CM

## 2016-10-13 DIAGNOSIS — K121 Other forms of stomatitis: Secondary | ICD-10-CM | POA: Diagnosis not present

## 2016-10-13 DIAGNOSIS — M3500 Sicca syndrome, unspecified: Secondary | ICD-10-CM | POA: Diagnosis not present

## 2016-10-13 LAB — GLUCOSE, POCT (MANUAL RESULT ENTRY): POC GLUCOSE: 76 mg/dL (ref 70–99)

## 2016-10-13 LAB — POCT SKIN KOH: SKIN KOH, POC: NEGATIVE

## 2016-10-13 MED ORDER — MAGIC MOUTHWASH W/LIDOCAINE: 10.0000 mL | ORAL | 0 refills | Status: DC | PRN

## 2016-10-13 NOTE — Telephone Encounter (Signed)
Called pt but can lvm vm hasn't been set up.

## 2016-10-13 NOTE — Patient Instructions (Addendum)
The test for yeast was negative and your glucose was 76, which is well within normal. We should have your other lab results in the next week. In the meantime, I recommend using magic mouthwash as prescribed for pain and also eating a soft food diet such as soups and smoothies until the ulcers heal. I also recommend adding a multivitamin to your diet. Thank you for letting me participate in your health and well being.   Oral Ulcers Oral ulcers are sores inside the mouth or near the mouth. They may be called canker sores or cold sores, which are two types of oral ulcers. Many oral ulcers are harmless and go away on their own. In some cases, oral ulcers may require medical care to determine the cause and proper treatment. What are the causes? Common causes of this condition include:  Viral, bacterial, or fungal infection.  Emotional stress.  Foods or chemicals that irritate the mouth.  Injury or physical irritation of the mouth.  Medicines.  Allergies.  Tobacco use.  Less common causes include:  Skin disease.  A type of herpes virus infection (herpes simplexor herpes zoster).  Oral cancer.  In some cases, the cause of this condition may not be known. What increases the risk? Oral ulcers are more likely to develop in:  People who wear dental braces, dentures, or retainers.  People who do not keep their mouth clean or brush their teeth regularly.  People who have sensitive skin.  People who have conditions that affect the entire body (systemic conditions), such as immune disorders.  What are the signs or symptoms? The main symptom of this condition is one or more oval-shaped or round ulcers that have red borders. Details about symptoms may vary depending on the cause.  Location of the ulcers. They may be inside the mouth, on the gums, or on the insides of the lips or cheeks. They may also be on the lips or on skin that is near the mouth, such as the cheeks and chin.  Pain.  Ulcers can be painful and uncomfortable, or they can be painless.  Appearance of the ulcers. They may look like red blisters and be filled with fluid, or they may be white or yellow patches.  Frequency of outbreaks. Ulcers may go away permanently after one outbreak, or they may come back (recur) often or rarely.  How is this diagnosed? This condition is diagnosed with a physical exam. Your health care provider may ask you questions about your lifestyle and your medical history. You may have tests, including:  Blood tests.  Removal of a small number of cells from an ulcer to be examined under a microscope (biopsy).  How is this treated? This condition is treated by managing any pain and discomfort, and by treating the underlying cause of the ulcers, if necessary. Usually, oral ulcers resolve by themselves in 1-2 weeks. You may be told to keep your mouth clean and avoid things that cause or irritate your ulcers. Your health care provider may prescribe medicines to reduce pain and discomfort or treat the underlying cause, if this applies. Follow these instructions at home: Lifestyle  Follow instructions from your health care provider about eating or drinking restrictions. ? Drink enough fluid to keep your urine clear or pale yellow. ? Avoid foods and drinks that irritate your ulcers.  Avoid tobacco products, including cigarettes, chewing tobacco, or e-cigarettes. If you need help quitting, ask your health care provider.  Avoid excessive alcohol use. Oral Hygiene  Avoid physical or chemical irritants that may have caused the ulcers or made them worse, such as mouthwashes that contain alcohol (ethanol). If you wear dental braces, dentures, or retainers, work with your health care provider to make sure these devices are fitted correctly.  Brush and floss your teeth at least once every day, and get regular dental cleanings and checkups.  Gargle with a salt-water mixture 3-4 times per day or  as told by your health care provider. To make a salt-water mixture, completely dissolve -1 tsp of salt in 1 cup of warm water. General instructions  Take over-the-counter and prescription medicines only as told by your health care provider.  If you have pain, wrap a cold compress in a towel and gently press it against your face to help reduce pain.  Keep all follow-up visits as told by your health care provider. This is important. Contact a health care provider if:  You have pain that gets worse or does not get better with medicine.  You have 4 or more ulcers at one time.  You have a fever.  You have new ulcers that look or feel different from other ulcers you have.  You have inflammation in one eye or both eyes.  You have ulcers that do not go away after 10 days.  You develop new symptoms in your mouth, such as: ? Bleeding or crusting around your lips or gums. ? Tooth pain. ? Difficulty swallowing.  You develop symptoms on your skin or genitals, such as: ? A rash or blisters. ? Burning or itching sensations.  Your ulcers begin or get worse after you start a new medicine. Get help right away if:  You have difficulty breathing.  You have swelling in your face or neck.  You have excessive bleeding from your mouth.  You have severe pain. This information is not intended to replace advice given to you by your health care provider. Make sure you discuss any questions you have with your health care provider. Document Released: 05/22/2004 Document Revised: 09/17/2015 Document Reviewed: 08/30/2014 Elsevier Interactive Patient Education  2018 [employer].    IF you received an x-ray today, you will receive an invoice from [facility]. Please contact [facility] at [phone] with questions or concerns regarding your invoice.   IF you received labwork today, you will receive an invoice from [place]. Please contact [facility] at [phone] with questions  or concerns regarding your invoice.   Our billing staff will not be able to assist you with questions regarding bills from these companies.  You will be contacted with the lab results as soon as they are available. The fastest way to get your results is to activate your My Chart account. Instructions are located on the last page of this paperwork. If you have not heard from [place] regarding the results in 2 weeks, please contact this office.

## 2016-10-13 NOTE — Progress Notes (Signed)
Sandra Mills  MRN: [id number] DOB: 03/27/1961  Subjective:  Sandra Mills is a 56 y.o. female seen in office today for a chief complaint of mouth dryness x 2 months ago after being placed on effexor. Stopped effexor 6 weeks ago but her mouth has continued to stay dry. Has associated increased thirst, discharge, dryness of lips, canker sores on the inside, pain with swallowing, and dry eyes. Denies recent illness. Drinks water consistently throughout the day. Has a well balanced diet. Does not take daily multivitamin. Has no personal hx of diabetes or HSV. Has been screened for for HIV once in her lifetime, it was negative. Has tried vaseline on lips for the dryness. Of note, pt does have a new sexual partner as of five months and has performed oral sex without a condom. PA [facility] sent a Rx in for clotrimazole troches on 10/10/16 but patient did not know this. PA Leotis Shames has ordered blood work to evaluate this on 10/10/16 (i.e., RF, sed rate, sjogren's syndrome antibodies). Pt came this morning for labs.   Review of Systems  Constitutional: Negative for chills, diaphoresis, fatigue and fever.  HENT: Negative for voice change.   Eyes: Negative for visual disturbance.  Cardiovascular: Negative for chest pain.  Gastrointestinal: Negative for abdominal pain, diarrhea, nausea and vomiting.  Endocrine: Negative for polyphagia and polyuria.  Skin: Negative for rash.  Neurological: Negative for dizziness and light-headedness.    Patient Active Problem List   Diagnosis Date Noted  . Severe major depression without psychotic features (HCC) 09/26/2016  . Anxiety and depression 06/23/2007  . TREMOR, ESSENTIAL 06/23/2007  . Migraine, menstrual 06/23/2007    Current Outpatient Prescriptions on File Prior to Visit  Medication Sig Dispense Refill  . sertraline (ZOLOFT) 100 MG tablet Take 1 tablet (100 mg total) by mouth daily. For depression 90 tablet 0  . clotrimazole (MYCELEX) 10 MG troche  Take 1 tablet (10 mg total) by mouth 5 (five) times daily. (Patient not taking: Reported on 10/13/2016) 30 tablet 0   No current facility-administered medications on file prior to visit.     No Known Allergies   Objective:  BP 108/74 (BP Location: Right Arm, Patient Position: Sitting, Cuff Size: Normal)   Pulse 87   Temp 98.3 F (36.8 C) (Oral)   Resp 18   Ht 5' 2.91" (1.598 m)   Wt 167 lb 12.8 oz (76.1 kg)   SpO2 95%   BMI 29.81 kg/m   Physical Exam  Constitutional: She is oriented to person, place, and time and well-developed, well-nourished, and in no distress.  HENT:  Head: Normocephalic and atraumatic.  Mouth/Throat: Oropharynx is clear and moist and mucous membranes are normal. Oral lesions present.    Tonsils are 1+ bilaterally without exudates.   Eyes: Conjunctivae are normal.  Neck: Normal range of motion.  Pulmonary/Chest: Effort normal.  Lymphadenopathy:       Head (right side): No submental, no submandibular, no tonsillar, no preauricular, no posterior auricular and no occipital adenopathy present.       Head (left side): No submental, no submandibular, no tonsillar, no preauricular, no posterior auricular and no occipital adenopathy present.    She has no cervical adenopathy.       Right: No supraclavicular adenopathy present.       Left: No supraclavicular adenopathy present.  Neurological: She is alert and oriented to person, place, and time. Gait normal.  Skin: Skin is warm and dry. No rash noted.  Psychiatric: Affect  normal.  Vitals reviewed.  Results for orders placed or performed in visit on 10/13/16 (from the past 24 hour(s))  POCT glucose (manual entry)     Status: None   Collection Time: 10/13/16  5:27 PM  Result Value Ref Range   POC Glucose 76 70 - 99 mg/dl  POCT Skin KOH     Status: None   Collection Time: 10/13/16  5:28 PM  Result Value Ref Range   Skin KOH, POC Negative Negative   Assessment and Plan :  1. Mouth ulcers POCT labs  reassuring. No clear etiology at this time. Further labs pending. Pt given Rx for magic mouthwash to use while awaiting lab results. Also instructed to start taking daily multivitamin. Return to clinic if symptoms worsen. Follow up with PA Leotis ShamesJeffery as planned.  - RPR - HIV antibody - Herpes simplex virus culture - magic mouthwash w/lidocaine SOLN; Take 10 mLs by mouth every 2 (two) hours as needed for mouth pain.  Dispense: 360 mL; Refill: 0 2. Dry mouth - POCT Skin KOH - POCT glucose (manual entry)   Benjiman CoreBrittany Carrine Kroboth, PA-C  [facility] 10/13/2016 6:58 PM

## 2016-10-14 ENCOUNTER — Other Ambulatory Visit (HOSPITAL_COMMUNITY): Payer: BC Managed Care – PPO | Admitting: Licensed Clinical Social Worker

## 2016-10-14 ENCOUNTER — Telehealth: Payer: Self-pay | Admitting: Physician Assistant

## 2016-10-14 DIAGNOSIS — F322 Major depressive disorder, single episode, severe without psychotic features: Secondary | ICD-10-CM

## 2016-10-14 DIAGNOSIS — M3500 Sicca syndrome, unspecified: Secondary | ICD-10-CM | POA: Diagnosis not present

## 2016-10-14 LAB — SJOGREN'S SYNDROME ANTIBODS(SSA + SSB): ENA SSA (RO) Ab: <0.2 AI (ref 0.0–0.9)

## 2016-10-14 LAB — SEDIMENTATION RATE: Sed Rate: 10 mm/hr (ref 0–40)

## 2016-10-14 LAB — RHEUMATOID FACTOR: Rhuematoid fact SerPl-aCnc: <10 IU/mL (ref 0.0–13.9)

## 2016-10-14 NOTE — Telephone Encounter (Signed)
Please advise 

## 2016-10-14 NOTE — Telephone Encounter (Signed)
Pt seen 6/18 and labs were done

## 2016-10-14 NOTE — Progress Notes (Signed)
    Daily Group Progress Note  Program: IOP  Group Time: 9:00-12:00  Participation Level: Minimal  Behavioral Response: Appropriate  Type of Therapy:  Group Therapy  Summary of Progress: Pt presents with appropriate affect and anxious mood. Pt states she is "okay" today and is working on reaching out to people again. Pt engaged in group discussion regarding coping with anxiety and self harm. Pt participated in chaplaincy group. Pt denies SI/HI.     Sandra GuilesJenny Payden Bonus, LCSW

## 2016-10-14 NOTE — Telephone Encounter (Signed)
Called pharmacy. Stated that that it was okay to substitute their facility's mouth wash recipe, mix 1:1 with viscous lidocaine for magic mouthwash.

## 2016-10-14 NOTE — Telephone Encounter (Signed)
[facility] is callilng regarding magic mouth wash and needing more instructions on the ingredients   Best number is [phone]

## 2016-10-14 NOTE — Telephone Encounter (Signed)
[facility] IS CALL[facility] ABOUT [facility]

## 2016-10-15 ENCOUNTER — Other Ambulatory Visit (HOSPITAL_COMMUNITY): Payer: BC Managed Care – PPO | Admitting: Psychiatry

## 2016-10-15 DIAGNOSIS — M3500 Sicca syndrome, unspecified: Secondary | ICD-10-CM | POA: Diagnosis not present

## 2016-10-15 DIAGNOSIS — F322 Major depressive disorder, single episode, severe without psychotic features: Secondary | ICD-10-CM

## 2016-10-15 LAB — HERPES SIMPLEX VIRUS CULTURE

## 2016-10-16 ENCOUNTER — Other Ambulatory Visit (HOSPITAL_COMMUNITY): Payer: BC Managed Care – PPO | Admitting: Licensed Clinical Social Worker

## 2016-10-16 DIAGNOSIS — F332 Major depressive disorder, recurrent severe without psychotic features: Secondary | ICD-10-CM

## 2016-10-16 DIAGNOSIS — M3500 Sicca syndrome, unspecified: Secondary | ICD-10-CM | POA: Diagnosis not present

## 2016-10-16 NOTE — Progress Notes (Signed)
    Daily Group Progress Note  Program: IOP  Group Time: 9:00-12:00  Participation Level: Active  Behavioral Response: Appropriate  Type of Therapy:  Group Therapy  Summary of Progress: Pt presents with appropriate brighter affect and anxious mood stating she felt "anxious, for no real reason." Pt was more active in group discussion. Pt engaged in group discussion regarding mental health stigma and how to address struggles with people in your life. Pt participated in relaxation through yoga group. Pt denies SI/HI.    Donia GuilesJenny Taeveon Keesling, LCSW

## 2016-10-17 ENCOUNTER — Other Ambulatory Visit (HOSPITAL_COMMUNITY): Payer: BC Managed Care – PPO | Admitting: Psychiatry

## 2016-10-17 DIAGNOSIS — F332 Major depressive disorder, recurrent severe without psychotic features: Secondary | ICD-10-CM

## 2016-10-17 DIAGNOSIS — M3500 Sicca syndrome, unspecified: Secondary | ICD-10-CM | POA: Diagnosis not present

## 2016-10-17 NOTE — Progress Notes (Signed)
    Daily Group Progress Note  Program: IOP  Group Time: 9:00-12:00  Participation Level: Active  Behavioral Response: Appropriate  Type of Therapy:  Group Therapy  Summary of Progress: Pt. Presents as engaged in the group process. Pt. Disclosed for the first time to the group her husband's infidelity and her feelings of inadequacy related to his disclosure. Pt. Discussed her experience in grief and loss group yesterday and for the first time understanding that part of her pain was attributed to the grief that she is experiencing from the loss of her family as she had known for many years. Pt. participated in presentation by Clyda GreenerJamie Athos from the wellness department about the importance of nutrition, exercise/movement and sleep hygiene to managing mental health.      Shaune PollackBrown, Keiaira Donlan B, LPC

## 2016-10-17 NOTE — Progress Notes (Signed)
    Daily Group Progress Note  Program: IOP  Group Time: 9:00-12:00  Participation Level: Active  Behavioral Response: Appropriate  Type of Therapy:  Group Therapy  Summary of Progress: Pt. Reported to the group that she was doing "ok". Pt. Shared with the counselor privately that she was concerned that she had not been able to disclose to the group yet about her anxiety and thoughts and feelings about the end of her marriage I.e., guilt, shame.. Pt. Was encouraged to give the program a few more days to see if she felt more comfortable.     Shaune PollackBrown, Catelynn Sparger B, LPC

## 2016-10-17 NOTE — Progress Notes (Signed)
    Daily Group Progress Note  Program: IOP  Group Time: 9:00-12:00  Participation Level: Active  Behavioral Response: Appropriate  Type of Therapy:  Group Therapy  Summary of Progress: Pt. Presents with improved mood, engaged in the group process. Pt. Has made significant progress in her ability to discuss her guilt and shame regarding the end of her marriage. Pt. Discussed that she continues to feel that she abandoned her children. Pt. Received feedback from the group regarding finding exceptions and identifying the inaccuracies of her thoughts about her behavior and her children. Participated in discussion about mindfulness, meditation, and yoga in order to develop acceptance and compassion for thoughts and feelings. Watched [employer] Buy[employer]rene Canna Nickelson video about the connection of boundaries to empathy and genuine compassion.      Shaune PollackBrown, Gurleen Larrivee B, LPC

## 2016-10-20 ENCOUNTER — Other Ambulatory Visit (HOSPITAL_COMMUNITY): Payer: BC Managed Care – PPO | Admitting: Psychiatry

## 2016-10-20 DIAGNOSIS — M3500 Sicca syndrome, unspecified: Secondary | ICD-10-CM | POA: Diagnosis not present

## 2016-10-20 DIAGNOSIS — F332 Major depressive disorder, recurrent severe without psychotic features: Secondary | ICD-10-CM

## 2016-10-20 NOTE — Progress Notes (Signed)
    Daily Group Progress Note  Program: IOP  Group Time: 9:00-12:00  Participation Level: Active  Behavioral Response: Appropriate  Type of Therapy:  Group Therapy  Summary of Progress: Pt. Continues to present as mildly anxious, quiet. Pt. Reported that she was doing "ok". Pt. Stated that staying in the moment was a challenge for her due to thoughts about her family and her finances. Pt. Participated in discussion about use of mindfulness as a practice to help with staying in the moment.     Shaune PollackBrown, Ivie Savitt B, LPC

## 2016-10-21 ENCOUNTER — Other Ambulatory Visit (HOSPITAL_COMMUNITY): Payer: BC Managed Care – PPO | Admitting: Psychiatry

## 2016-10-21 DIAGNOSIS — F332 Major depressive disorder, recurrent severe without psychotic features: Secondary | ICD-10-CM

## 2016-10-21 DIAGNOSIS — M3500 Sicca syndrome, unspecified: Secondary | ICD-10-CM | POA: Diagnosis not present

## 2016-10-21 NOTE — Progress Notes (Signed)
    Daily Group Progress Note  Program: IOP  Group Time: 9:00-12:00  Participation Level: Active  Behavioral Response: Appropriate  Type of Therapy:  Group Therapy  Summary of Progress: Pt. Presents as talkative, mildly anxious. Pt. Reported that she had a good weekend with her children and also spent time with her boyfriend. Participated in discussion on the importance of medication management and journaling in order to develop awareness of how they are affected by medications.      Shaune PollackBrown, Amritha Yorke B, LPC

## 2016-10-22 ENCOUNTER — Other Ambulatory Visit (HOSPITAL_COMMUNITY): Payer: BC Managed Care – PPO | Admitting: Psychiatry

## 2016-10-22 DIAGNOSIS — M3500 Sicca syndrome, unspecified: Secondary | ICD-10-CM | POA: Diagnosis not present

## 2016-10-22 DIAGNOSIS — F332 Major depressive disorder, recurrent severe without psychotic features: Secondary | ICD-10-CM

## 2016-10-22 DIAGNOSIS — F322 Major depressive disorder, single episode, severe without psychotic features: Secondary | ICD-10-CM

## 2016-10-22 NOTE — Progress Notes (Signed)
Negative for herpes.

## 2016-10-23 ENCOUNTER — Other Ambulatory Visit (HOSPITAL_COMMUNITY): Payer: BC Managed Care – PPO | Admitting: Psychiatry

## 2016-10-23 DIAGNOSIS — M3500 Sicca syndrome, unspecified: Secondary | ICD-10-CM | POA: Diagnosis not present

## 2016-10-23 DIAGNOSIS — F332 Major depressive disorder, recurrent severe without psychotic features: Secondary | ICD-10-CM

## 2016-10-24 ENCOUNTER — Other Ambulatory Visit (HOSPITAL_COMMUNITY): Payer: BC Managed Care – PPO | Admitting: Psychiatry

## 2016-10-24 DIAGNOSIS — F332 Major depressive disorder, recurrent severe without psychotic features: Secondary | ICD-10-CM

## 2016-10-24 DIAGNOSIS — M3500 Sicca syndrome, unspecified: Secondary | ICD-10-CM | POA: Diagnosis not present

## 2016-10-24 MED ORDER — GABAPENTIN 100 MG PO CAPS: 100.0000 mg | ORAL_CAPSULE | Freq: Three times a day (TID) | ORAL | 2 refills | Status: DC

## 2016-10-24 NOTE — Progress Notes (Signed)
  [facility]  Sandra Mills [id number]  Admission date: 10/08/16 Discharge date: 10/24/16   Reason for admission: Anxiety and Depression in the setting of severe family illness and loss  Chemical Use History: none  Family of Origin Issues: Recent illness in her family, contributing to mood worsening  Progress in Program Toward Treatment Goals: She feels that she's had significant progress in the improvement of her depressive symptoms. She continues to struggle with some anxiety, and we are restarting gabapentin to help with this. She will follow-up with this writer in 6 weeks for medication management, and return to therapy with Beth in this clinic.  No acute safety issues, and she is appropriate for completion of the IOP.  Ardeth SportsmanAlex Eksir, MD

## 2016-10-24 NOTE — Progress Notes (Signed)
Sandra Mills is a [age] y.o. female who presented d/t worsening depression and suicidal ideation, anxiety. [facility]  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 She is currently on FMLA, however school has ended for the summer as of 10/07/2016. She is separated from her husband of 31 years  and will be divorced in August. He has sexual identity issues. She has 3 children who are in college obtiaing their second degree. Her youngest child just graduated from [employer]. and will be going to [employer]. Husband is a Customer service managerprofressor at [employer]. Pt completed MH-IOP today.  Although overall mood is improving, pt continues to struggle with anxiety.  States sometimes the anxiety is so extreme that she can't function.  States IOP was beneficial.  "It was great hearing other people that I had things in common with."  Pt is interested in maintaining structure by way of attending support groups.  Pt has also voiced that she would like family/couples therapy.  Denies SI/HI or A/V hallucinations.  A:  D/C today.  F/U with Dr. Rene KocherEksir in August 2018 and pt will make appt with Idalia NeedleBeth MacKenzie, LCAS.  Encouraged [facility], along with [employer] Milan, LCSW support groups for anxiety.  R:  Pt receptive.    Jeri Modenaita Giang Hemme, M.Ed, CNA

## 2016-10-24 NOTE — Patient Instructions (Signed)
D:  Patient successfully completed MH-IOP today.  A:  Discharge today.  Follow up with Dr. Rene KocherEksir and Idalia NeedleBeth MacKenzie, LCAS.  Encouraged support groups.  R:  Pt receptive.

## 2016-10-25 NOTE — Progress Notes (Signed)
    Daily Group Progress Note  Program: IOP  Group Time: 9:00-12:00  Participation Level: Active  Behavioral Response: Appropriate  Type of Therapy:  Group Therapy  Summary of Progress: Pt. Prepared for discharge and met with the case manager and the psychiatrist. Pt. Received positive feedback from the group regarding her positive contribution to the group. Pt. Discussed that the group had been very helpful in working through the shame that she carried related to the end of her marriage. Pt. Participated in conversation about the need for validation in different areas of our lives including career, relationship and the processing of accepting that the validation we desire.        Nancie Neas, LPC

## 2016-10-25 NOTE — Progress Notes (Signed)
    Daily Group Progress Note  Program: IOP  Group Time: 9:00-12:00  Participation Level: Active  Behavioral Response: Appropriate  Type of Therapy:  Group Therapy  Summary of Progress: Pt. Presents as talkative, calm with significant reduction in her anxiety. Pt. Discussed her son's marijuana use and her concerns about what she believes to be underlying depression. Counselor encouraged her to set up a family session with him and recommended a therapist who specializes in adolescent drug use. Pt.  Participated in discussion about local community mental health support services facilitated by Trinna PostAlex from the mental health association.      Shaune PollackBrown, Jennifer B, LPC

## 2016-10-25 NOTE — Progress Notes (Signed)
    Daily Group Progress Note  Program: IOP  Group Time: 9:00-12:00  Participation Level: Active  Behavioral Response: Appropriate  Type of Therapy:  Group Therapy  Summary of Progress: Pt. Reported that she was feeling some anxiety. Pt. Discussed patterns of being very critical of herself. Pt. Discussed that she had a good weekend with her bodyfriend and was able to relax. Participated in medication management group facilitated by the pharmacist.     Shaune PollackBrown, Colter Magowan B, [facility]

## 2016-10-25 NOTE — Progress Notes (Signed)
    Daily Group Progress Note  Program: IOP  Group Time: 9:00-12:00  Participation Level: Active  Behavioral Response: Appropriate  Type of Therapy:  Group Therapy  Summary of Progress: Pt. Reported that she felt less anxious. Pt. Discussed the healthy relationship that she is in now with there boyfriend. Pt. Participated in discussion of Mel Robbins video and using the "5 second" rule to resist perfectionism and procrastination.      Shaune PollackBrown, Negin Hegg B, LPC

## 2016-10-25 NOTE — Progress Notes (Signed)
    Daily Group Progress Note  Program: IOP  Group Time: 9:00-12:00  Participation Level: Active  Behavioral Response: Appropriate  Type of Therapy:  Group Therapy  Summary of Progress: Pt. Presents as talkative, engaged in the group process. Pt. Discussed her challenge of enforcing boundaries with her 55 year old son when his father does not support her or help to reinforce appropriate boundaries. Pt. Participated in discussion about how to recognize where your personal boundaries are and when they have been crossed, and recognizing your personal "NO".     Shaune PollackBrown, Jennifer B, LPC

## 2016-10-27 ENCOUNTER — Other Ambulatory Visit (HOSPITAL_COMMUNITY): Payer: BC Managed Care – PPO

## 2016-10-27 ENCOUNTER — Other Ambulatory Visit: Payer: Self-pay | Admitting: Physician Assistant

## 2016-10-27 DIAGNOSIS — F332 Major depressive disorder, recurrent severe without psychotic features: Secondary | ICD-10-CM

## 2016-10-28 ENCOUNTER — Other Ambulatory Visit (HOSPITAL_COMMUNITY): Payer: BC Managed Care – PPO

## 2016-10-29 NOTE — Assessment & Plan Note (Signed)
Continue on current medications. Psychiatrist alert to dry mouth, and labs performed to evaluate for connective tissue disorder, specifically Sjogren's syndrome. Proceed with IOP.

## 2016-10-29 NOTE — Assessment & Plan Note (Signed)
Continue gabapentin, sertraline and hydroxyzine. Proceed with IOP.

## 2016-11-20 ENCOUNTER — Ambulatory Visit (INDEPENDENT_AMBULATORY_CARE_PROVIDER_SITE_OTHER): Payer: BC Managed Care – PPO | Admitting: Licensed Clinical Social Worker

## 2016-11-20 ENCOUNTER — Encounter (HOSPITAL_COMMUNITY): Payer: Self-pay | Admitting: Licensed Clinical Social Worker

## 2016-11-20 DIAGNOSIS — F322 Major depressive disorder, single episode, severe without psychotic features: Secondary | ICD-10-CM | POA: Diagnosis not present

## 2016-11-20 NOTE — Progress Notes (Signed)
   THERAPIST PROGRESS NOTE  Session Time: 11:10-12pm  Participation Level: Active  Behavioral Response: CasualAlertEuthymic  Type of Therapy: Individual Therapy  Treatment Goals addressed: Coping  Interventions: CBT  Summary: Sandra Mills is a 56 y.o. female who presents  for initial individual appointment after completion of IOP. Spent a considerable amount of time building a trusting therapeutic relationship and gaining background information. Pt discussed her psychiatric symptoms.. Pt is using her coping tools learned in IOP.  Suicidal/Homicidal: Nowithout intent/plan  Therapist Response: Assessed pt's current functioing for baseline. Assisted pt building a trusting therapeutic relationship.  Plan: Return again in 2 weeks and to begin OP group 8/7 at 5:30.  Diagnosis: Axis I: Severe Major Depression without psychotic features    MACKENZIE,LISBETH S, LCAS 11/20/2016

## 2016-12-02 ENCOUNTER — Ambulatory Visit (INDEPENDENT_AMBULATORY_CARE_PROVIDER_SITE_OTHER): Payer: BC Managed Care – PPO | Admitting: Licensed Clinical Social Worker

## 2016-12-02 DIAGNOSIS — F322 Major depressive disorder, single episode, severe without psychotic features: Secondary | ICD-10-CM | POA: Diagnosis not present

## 2016-12-03 ENCOUNTER — Ambulatory Visit (INDEPENDENT_AMBULATORY_CARE_PROVIDER_SITE_OTHER): Payer: BC Managed Care – PPO | Admitting: Psychiatry

## 2016-12-03 ENCOUNTER — Encounter (HOSPITAL_COMMUNITY): Payer: Self-pay | Admitting: Psychiatry

## 2016-12-03 VITALS — BP 122/70 | HR 95 | Ht 63.0 in | Wt 169.6 lb

## 2016-12-03 DIAGNOSIS — F332 Major depressive disorder, recurrent severe without psychotic features: Secondary | ICD-10-CM

## 2016-12-03 DIAGNOSIS — F3342 Major depressive disorder, recurrent, in full remission: Secondary | ICD-10-CM

## 2016-12-03 MED ORDER — SERTRALINE HCL 100 MG PO TABS: 100.0000 mg | ORAL_TABLET | Freq: Every day | ORAL | 0 refills | Status: DC

## 2016-12-03 MED ORDER — GABAPENTIN 100 MG PO CAPS: ORAL_CAPSULE | ORAL | 2 refills | Status: DC

## 2016-12-03 NOTE — Progress Notes (Signed)
BH MD/PA/NP OP Progress Note  12/03/2016 10:55 AM DAILYNN NANCARROW  MRN:  [id number]  Chief Complaint: med management  Subjective:  Sandra Mills tense today for med management follow-up. She reports that things have been going really well over the past few weeks and her mouth dryness has improved and resolved. She reports that things are going well with her boyfriend. She had a nice trip to the outer [place] and is eager to start work again this fall in a few weeks. She denies any suicidal thoughts or substance abuse. She will continue to work with her therapist in this office and follow-up with Clinical research associate in 3 months. She agrees to continue on Zoloft 100 mg daily and gabapentin 100 mg 2-3 times daily as needed. Eating well and maintains a good appetite and good energy.  Visit Diagnosis:    ICD-10-CM   1. Recurrent major depressive disorder, in full remission (HCC) F33.42   2. Severe episode of recurrent major depressive disorder, without psychotic features (HCC) F33.2 sertraline (ZOLOFT) 100 MG tablet    gabapentin (NEURONTIN) 100 MG capsule    Past Psychiatric History: See intake H&P for full details. Reviewed, with no updates at this time.  Past Medical History:  Past Medical History:  Diagnosis Date  . Anxiety   . Colon polyps 12/09/12   three polyps; [facility]; repeat in 3-5 years.  . Depression   . IBS (irritable bowel syndrome)    diarrhea predominant; s/p GI consult with colonoscopy  . Migraine    menstrually related.  . Tremor, essential     Past Surgical History:  Procedure Laterality Date  . COLONOSCOPY W/ POLYPECTOMY  12/09/12   three polyps; [place]. Repeat in 3-5 years.  . Cyst resection perineal      Family Psychiatric History: See intake H&P for full details. Reviewed, with no updates at this time.   Family History:  Family History  Problem Relation Age of Onset  . Breast cancer Mother 7  . Cancer Mother 75       Breast cancer age 75  . Atrial fibrillation Father    . Atrial fibrillation Sister   . Breast cancer Other        maternal great aunt dx in her 5s  . Breast cancer Other        6 maternal great aunts dx in their 31s    Social History:  Social History   Social History  . Marital status: Legally Separated    Spouse name: 11/2015  . Number of children: 4  . Years of education: Master's   Occupational History  . 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 Smoking status: Never Smoker  . Smokeless tobacco: Never Used  . Alcohol use No  . Drug use: No  . Sexual activity: Yes    Birth control/ protection: Surgical     Comment: Husband vasectomy   Other Topics Concern  . None   Social History Narrative   Marital status:  Married x 31 years, no abuse. Separated 11/2015. Dating.      Lives: Lives alone, children (1993, 1995, 1997, 1999) live with their father and visit her regulalry.  No grandchildren.      Children: 4 children; no grandchildren.      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  Previously worked [employer].   No tobacco/alcohol/drugs; wine three times per year.   Exercise sporadic; curves and [employer].  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      Allergies: No Known Allergies  Metabolic Disorder Labs: Lab Results  Component Value Date   HGBA1C 5.3 09/27/2016   MPG 105 09/27/2016   MPG 108 01/08/2015   No results found for: PROLACTIN Lab Results  Component Value Date   CHOL 191 09/27/2016   TRIG 135 09/27/2016   HDL 56 09/27/2016   CHOLHDL 3.4 09/27/2016   VLDL 27 09/27/2016   LDLCALC 108 (H) 09/27/2016   LDLCALC 91 01/08/2015     Current Medications: Current Outpatient Prescriptions  Medication Sig Dispense Refill  . gabapentin (NEURONTIN) 100 MG capsule TAKE 2 CAPSULES BY MOUTH 2 TIMES DAILY 120 capsule 2  . sertraline (ZOLOFT) 100 MG tablet Take 1 tablet (100 mg total) by mouth daily. For depression 90 tablet 0   No current facility-administered  medications for this visit.     Neurologic: Headache: Negative Seizure: Negative Paresthesias: Negative  Musculoskeletal: Strength & Muscle Tone: within normal limits Gait & Station: normal Patient leans: N/A  Psychiatric Specialty Exam: ROS  Blood pressure 122/70, pulse 95, height 5\' 3"  (1.6 m), weight 169 lb 9.6 oz (76.9 kg).Body mass index is 30.04 kg/m.  General Appearance: Casual and Fairly Groomed  Eye Contact:  Good  Speech:  Clear and Coherent  Volume:  Normal  Mood:  Euthymic  Affect:  Appropriate and Congruent  Thought Process:  Goal Directed  Orientation:  Full (Time, Place, and Person)  Thought Content: Logical   Suicidal Thoughts:  No  Homicidal Thoughts:  No  Memory:  Immediate;   Fair  Judgement:  Fair  Insight:  Fair  Psychomotor Activity:  Normal  Concentration:  Concentration: Fair  Recall:  F[employer]Fair  Fund of Knowledge: Fair  Language: Fair  Akathisia:  Negative  Handed:  Right  AIMS (if indicated):  0  Assets:  Communication Skills Desire for Improvement Financial Resources/Insurance Housing Intimacy Social Support Transportation Vocational/Educational  ADL's:  Intact  Cognition: WNL  Sleep:  7 hours     Treatment Plan Summary: Sandra NorseDonna F Piehl is a 56 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 She participated in the IOP program and is now continuing in individual therapy. She is stable on her current medication regimen and does not present any acute safety issues. We will follow-up in 3 months or sooner if needed.  1. Recurrent major depressive disorder, in full remission (HCC)   2. Severe episode of recurrent major depressive disorder, without psychotic features (HCC)    Continue Zoloft 100 mg daily; if increase in depressive symptoms or anxiety symptoms with school starting, okay to increase to 150 mg daily Continue gabapentin 100 mg 2-3 times per day Continue in  individual therapy  Burnard LeighAlexander Arya Eksir, MD 12/03/2016, 10:55 AM

## 2016-12-04 ENCOUNTER — Encounter (HOSPITAL_COMMUNITY): Payer: Self-pay | Admitting: Licensed Clinical Social Worker

## 2016-12-04 NOTE — Progress Notes (Signed)
Daily Group Progress Note     Program: OP   Group Time: 5:30-6:30  Participation Level: Active  Behavioral Response: Appropriate  Type of Therapy:  Psychoeducation/Therapy  Summary of Progress: Today is pt's first day in group. She introduced herself to the other group members and shared what brought her to treatment. Pt did not participate further in discussion but appropriately listened.     Sandra Mills, LCAS

## 2016-12-08 ENCOUNTER — Other Ambulatory Visit: Payer: Self-pay | Admitting: Physician Assistant

## 2016-12-08 DIAGNOSIS — R921 Mammographic calcification found on diagnostic imaging of breast: Secondary | ICD-10-CM

## 2016-12-09 ENCOUNTER — Ambulatory Visit (INDEPENDENT_AMBULATORY_CARE_PROVIDER_SITE_OTHER): Payer: BC Managed Care – PPO | Admitting: Licensed Clinical Social Worker

## 2016-12-09 DIAGNOSIS — F332 Major depressive disorder, recurrent severe without psychotic features: Secondary | ICD-10-CM | POA: Diagnosis not present

## 2016-12-10 ENCOUNTER — Encounter (HOSPITAL_COMMUNITY): Payer: Self-pay | Admitting: Licensed Clinical Social Worker

## 2016-12-10 NOTE — Progress Notes (Signed)
Daily Group Progress Note     Program: OP   Group Time: 5:30-6:30  Participation Level: Active  Behavioral Response: Appropriate  Type of Therapy:  Psychoeducation/Therapy  Summary of Progress: Pt participated in a discussion on building confidence while living with mental illness. Pt was encouraged to build her confidence through her strength and resilience.   Vernona RiegerLisbeth S. Avneet Ashmore, LCAS

## 2016-12-10 NOTE — Addendum Note (Signed)
Addended by: Vernona RiegerMACKENZIE, LISBETH S on: 12/10/2016 06:01 PM   Modules accepted: Level of Service

## 2016-12-12 ENCOUNTER — Ambulatory Visit
Admission: RE | Admit: 2016-12-12 | Discharge: 2016-12-12 | Disposition: A | Discharge status: Home / Self Care | Payer: BC Managed Care – PPO | Source: Ambulatory Visit | Attending: Physician Assistant | Admitting: Physician Assistant

## 2016-12-12 ENCOUNTER — Other Ambulatory Visit: Payer: Self-pay | Admitting: Physician Assistant

## 2016-12-12 ENCOUNTER — Encounter (HOSPITAL_COMMUNITY): Payer: Self-pay | Admitting: Psychiatry

## 2016-12-12 DIAGNOSIS — N6320 Unspecified lump in the left breast, unspecified quadrant: Secondary | ICD-10-CM

## 2016-12-12 DIAGNOSIS — R921 Mammographic calcification found on diagnostic imaging of breast: Secondary | ICD-10-CM

## 2016-12-16 ENCOUNTER — Ambulatory Visit (INDEPENDENT_AMBULATORY_CARE_PROVIDER_SITE_OTHER): Payer: BC Managed Care – PPO | Admitting: Licensed Clinical Social Worker

## 2016-12-16 DIAGNOSIS — F322 Major depressive disorder, single episode, severe without psychotic features: Secondary | ICD-10-CM

## 2016-12-17 ENCOUNTER — Encounter (HOSPITAL_COMMUNITY): Payer: Self-pay | Admitting: Licensed Clinical Social Worker

## 2016-12-17 ENCOUNTER — Encounter (HOSPITAL_COMMUNITY): Payer: Self-pay | Admitting: Psychiatry

## 2016-12-17 ENCOUNTER — Ambulatory Visit (INDEPENDENT_AMBULATORY_CARE_PROVIDER_SITE_OTHER): Payer: BC Managed Care – PPO | Admitting: Licensed Clinical Social Worker

## 2016-12-17 ENCOUNTER — Telehealth (HOSPITAL_COMMUNITY): Payer: Self-pay

## 2016-12-17 DIAGNOSIS — F322 Major depressive disorder, single episode, severe without psychotic features: Secondary | ICD-10-CM

## 2016-12-17 NOTE — Telephone Encounter (Signed)
[place]. All set

## 2016-12-17 NOTE — Telephone Encounter (Signed)
Patient called and needs a letter returning her to work - it will need to specify if there are any restrictions or not. She is coming in today to see Waynetta Sandy and would like to pick it up then.

## 2016-12-17 NOTE — Progress Notes (Signed)
   THERAPIST PROGRESS NOTE  Session Time: 4:30-5:15pm  Participation Level: Active  Behavioral Response: CasualAlertEuthymic  Type of Therapy: Individual Therapy  Treatment Goals addressed: Coping  Interventions: CBT  Summary: Sandra Mills is a 56 y.o. female who presents  for initial individual appointment.Pt discussed her psychiatric symptoms and current life events. Pt is back as a Runner, broadcasting/film/video for the fall semester. Pt has not seen nor spoken with her oldest chid (daughter) since July but had lunch today. All 4 of her children are upset because of the upcoming divorce. Ask pt open ended questions about her feelings surrounding the divorce. Discussed basic CBT concepts and the thought emotion connection and alternative perspectives. Taught pt mindfulness technique to use at home and at school for her stressors.   Suicidal/Homicidal: Nowithout intent/plan  Therapist Response: Assessed pt's current functioing by self report and reviewed progress. Assisted pt cbt and  Mindfulness technique.  Plan: Return again in 2 weeks and continue OP group Tuesday nights.  Diagnosis: Axis I: Severe Major Depression without psychotic features    Soloman Mckeithan S, LCAS 12/17/2016

## 2016-12-17 NOTE — Progress Notes (Signed)
Daily Group Progress Note     Program: OP   Group Time: 5:30-6:30  Participation Level: Active  Behavioral Response: Appropriate  Type of Therapy:  Psychoeducation/Therapy  Summary of Progress: Pt participated in a discussion on choosing happiness. Pt and her youngest son, who lives with her, had a blow up this week. He yelled at her for 1.5 hours. She used her boundaries and asked him to leave her home. Pt was encouraged by her fellow group members on handling her "blow up" with her son in a positive manner.    Vernona Rieger, LCAS

## 2016-12-23 ENCOUNTER — Ambulatory Visit (INDEPENDENT_AMBULATORY_CARE_PROVIDER_SITE_OTHER): Payer: BC Managed Care – PPO | Admitting: Licensed Clinical Social Worker

## 2016-12-23 DIAGNOSIS — F322 Major depressive disorder, single episode, severe without psychotic features: Secondary | ICD-10-CM | POA: Diagnosis not present

## 2016-12-24 ENCOUNTER — Encounter (HOSPITAL_COMMUNITY): Payer: Self-pay | Admitting: Licensed Clinical Social Worker

## 2016-12-24 NOTE — Progress Notes (Signed)
Daily Group Progress Note     Program: OP   Group Time: 5:30-6:30  Participation Level: Active  Behavioral Response: Appropriate  Type of Therapy:  Psychoeducation/Therapy  Summary of Progress: Pt participated in a discussion on happiness in mental health recovery. Pt participated in the happiness scale 1-10, with 10 being the most happy, identifying her happiness at 6 or 7 . Pt identified her growth in mental health recovery has come from his acceptance, reinforcement and support. Pt was encouraged to continue working on her own happiness that comes from within.   Vernona RiegerLisbeth S. Mackenzie, LCAS

## 2016-12-30 ENCOUNTER — Ambulatory Visit (HOSPITAL_COMMUNITY): Payer: Self-pay | Admitting: Licensed Clinical Social Worker

## 2017-01-03 ENCOUNTER — Other Ambulatory Visit (HOSPITAL_COMMUNITY): Payer: Self-pay | Admitting: Psychiatry

## 2017-01-03 DIAGNOSIS — F332 Major depressive disorder, recurrent severe without psychotic features: Secondary | ICD-10-CM

## 2017-01-06 ENCOUNTER — Ambulatory Visit (HOSPITAL_COMMUNITY): Payer: Self-pay | Admitting: Licensed Clinical Social Worker

## 2017-01-13 ENCOUNTER — Ambulatory Visit (HOSPITAL_COMMUNITY): Payer: Self-pay | Admitting: Licensed Clinical Social Worker

## 2017-01-20 ENCOUNTER — Ambulatory Visit (HOSPITAL_COMMUNITY): Payer: Self-pay | Admitting: Licensed Clinical Social Worker

## 2017-01-21 ENCOUNTER — Ambulatory Visit (INDEPENDENT_AMBULATORY_CARE_PROVIDER_SITE_OTHER): Payer: BC Managed Care – PPO | Admitting: Physician Assistant

## 2017-01-21 ENCOUNTER — Encounter: Payer: Self-pay | Admitting: Physician Assistant

## 2017-01-21 ENCOUNTER — Ambulatory Visit (INDEPENDENT_AMBULATORY_CARE_PROVIDER_SITE_OTHER): Payer: BC Managed Care – PPO

## 2017-01-21 VITALS — BP 118/74 | HR 74 | Temp 98.6°F | Resp 16 | Ht 63.0 in | Wt 171.4 lb

## 2017-01-21 DIAGNOSIS — Z23 Encounter for immunization: Secondary | ICD-10-CM | POA: Diagnosis not present

## 2017-01-21 DIAGNOSIS — R079 Chest pain, unspecified: Secondary | ICD-10-CM

## 2017-01-21 MED ORDER — CYCLOBENZAPRINE HCL 10 MG PO TABS: 5.0000 mg | ORAL_TABLET | Freq: Three times a day (TID) | ORAL | 0 refills | Status: DC | PRN

## 2017-01-21 MED ORDER — MELOXICAM 15 MG PO TABS: 15.0000 mg | ORAL_TABLET | Freq: Every day | ORAL | 1 refills | Status: DC

## 2017-01-21 NOTE — Patient Instructions (Addendum)
You may take acetaminophen, but NOT ALEVE OR IBUPROFEN, while taking the meloxicam.  The cyclobenzaprine causes sleepiness, so you may need to reserve it for bedtime.  Heating pad application will help.    IF you received an x-ray today, you will receive an invoice from [facility]. Please contact [facility] at [phone] with questions or concerns regarding your invoice.   IF you received labwork today, you will receive an invoice from [facility]. Please contact [facility] at [phone] with questions or concerns regarding your invoice.   Our billing staff will not be able to assist you with questions regarding bills from these companies.  You will be contacted with the lab results as soon as they are available. The fastest way to get your results is to activate your My Chart account. Instructions are located on the last page of this paperwork. If you have not heard from [place] regarding the results in 2 weeks, please contact this office.

## 2017-01-21 NOTE — Progress Notes (Signed)
Patient ID: Sandra Mills, female    DOB: 01-26-1961, 56 y.o.   MRN: [id number]  PCP: Porfirio Oar, PA-C  Chief Complaint  Patient presents with  . Shoulder Pain    and chest pain on the left side has been hurting for a while     Subjective:   Presents for evaluation of LEFT shoulder and chest pain.  "I think it's some kind of muscular...ibuprofen... I thought I was sleeping wrong on it, then I thought it was my bra, so I got rid of my under wire and stopped sleeping on that side."  [place]. Felt sore all over after multiple falls into the water, but didn't have pain in any specific spot. "It was really fun!"  Then yesterday, the pain in the LEFT chest got worse, and radiates into the shoulder and arm, and into the neck.  No SOB. Some nausea. No dizziness. No jaw pain.  Pain at rest. Increased pain with movement. Increased pain with palpation of the areas. 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  Took Mortin last night at HS and acetaminophen this morning. Felt a little better when she first got up, but worsening as she's moved around today.  Mammogram and [place] 12/12/2016 revealed calcifications in the LEFT breast, consistent with milk calcium. Annual mammography recommended.   Review of Systems As above.    Patient Active Problem List   Diagnosis Date Noted  . Recurrent major depressive disorder, in full remission (HCC) 12/03/2016  . Severe major depression without psychotic features (HCC) 09/26/2016  . Anxiety and depression 06/23/2007  . Migraine, menstrual 06/23/2007     Prior to Admission medications   Medication Sig Start Date End Date Taking? Authorizing Provider  gabapentin (NEURONTIN) 100 MG capsule TAKE 2 CAPSULES BY MOUTH 2 TIMES DAILY 12/03/16  Yes Eksir, Bo Mcclintock, MD  sertraline (ZOLOFT) 100 MG tablet Take 1 tablet (100 mg total) by mouth daily. For depression  12/03/16  Yes Eksir, Bo Mcclintock, MD     No Known Allergies     Objective:  Physical Exam  Constitutional: She is oriented to person, place, and time. She appears well-developed and well-nourished. She is active and cooperative. No distress.  BP 118/74   Pulse 74   Temp 98.6 F (37 C)   Resp 16   Ht  (1.6 m)   Wt 171 lb 6.4 oz (77.7 kg)   SpO2 99%   BMI 30.36 kg/m   HENT:  Head: Normocephalic and atraumatic.  Right Ear: Hearing normal.  Left Ear: Hearing normal.  Eyes: Conjunctivae are normal. No scleral icterus.  Neck: Normal range of motion. Neck supple. No thyromegaly present.  Cardiovascular: Normal rate, regular rhythm and normal heart sounds.   Pulses:      Radial pulses are 2+ on the right side, and 2+ on the left side.  Pulmonary/Chest: Effort normal and breath sounds normal.  Musculoskeletal:  Tenderness of the LEFT clavicle, shoulder, posterior upper arm, trapezius, cervical paraspinous muscles and chest wall.  Lymphadenopathy:       Head (right side): No tonsillar, no preauricular, no posterior auricular and no occipital adenopathy present.       Head (left side): No tonsillar, no preauricular, no posterior auricular and no occipital adenopathy present.    She has no cervical adenopathy.       Right: No supraclavicular adenopathy  present.       Left: No supraclavicular adenopathy present.  Neurological: She is alert and oriented to person, place, and time. No sensory deficit.  Skin: Skin is warm, dry and intact. No rash noted. No cyanosis or erythema. Nails show no clubbing.  Psychiatric: She has a normal mood and affect. Her speech is normal and behavior is normal.     Dg Chest 2 View  Result Date: 01/21/2017 CLINICAL DATA:  56 year old female with history of left-sided chest pain. EXAM: CHEST  2 VIEW COMPARISON:  Chest CT 02/21/2011. FINDINGS: Lung volumes are normal. No consolidative airspace disease. No pleural effusions. No pneumothorax. No  pulmonary nodule or mass noted. Pulmonary vasculature and the cardiomediastinal silhouette are within normal limits. IMPRESSION: No radiographic evidence of acute cardiopulmonary disease. Electronically Signed   By: Trudie Reed M.D.   On: 01/21/2017 18:30    EKG reviewed with Dr. Clelia Croft. Sinus rhythm, rate 65. PR 166, QT 402. Compared to previous tracing in 11/2011, changes noted in leads V1 and V2 (Ts now up in V1 and down in V2), suggestive of lead placement rather than old infarct.      Assessment & Plan:   1. Chest pain, unspecified type Unlikely to be cardiac in nature. Treat as musculoskeletal. If worsens, or if develops SOB, neck or jaw pain, or worsening nausea, proceed to the ED. - DG Chest 2 View; Future - EKG 12-Lead - cyclobenzaprine (FLEXERIL) 10 MG tablet; Take 0.5-1 tablets (5-10 mg total) by mouth 3 (three) times daily as needed for muscle spasms.  Dispense: 30 tablet; Refill: 0 - meloxicam (MOBIC) 15 MG tablet; Take 1 tablet (15 mg total) by mouth daily.  Dispense: 30 tablet; Refill: 1  2. Need for influenza vaccination - Flu Vaccine QUAD 36+ mos IM    Return if symptoms worsen or fail to improve.   Fernande Bras, PA-C [facility] at [facility]

## 2017-01-27 ENCOUNTER — Ambulatory Visit (HOSPITAL_COMMUNITY): Payer: Self-pay | Admitting: Licensed Clinical Social Worker

## 2017-01-29 ENCOUNTER — Ambulatory Visit (INDEPENDENT_AMBULATORY_CARE_PROVIDER_SITE_OTHER): Payer: BC Managed Care – PPO | Admitting: Licensed Clinical Social Worker

## 2017-01-29 ENCOUNTER — Encounter (HOSPITAL_COMMUNITY): Payer: Self-pay | Admitting: Licensed Clinical Social Worker

## 2017-01-29 DIAGNOSIS — F322 Major depressive disorder, single episode, severe without psychotic features: Secondary | ICD-10-CM | POA: Diagnosis not present

## 2017-01-29 NOTE — Progress Notes (Signed)
   THERAPIST PROGRESS NOTE  Session Time: 4:30-5:15pm  Participation Level: Active  Behavioral Response: CasualAlert/Distraught  Type of Therapy: Individual Therapy  Treatment Goals addressed: Coping  Interventions: CBT  Summary: Sandra Mills is a 56 y.o. female who presents  for initial individual appointment.Pt discussed her psychiatric symptoms and current life events. Pt was very distraught today. Her youngest son, freshman at [employer], asked her to come see him yesterday at the dorm. He had withdrawn from school and said he was hearing voices. She took him to the ED and was later admitted to [facility]. Pt was distraught because her son is over 43, therefore an adult. He did not give her the code to check on him while at [facility]. Pt wanted to talk about this. Asked pt open ended questions about his decision, how this affects her, her son's discharge plan. Pt was blaming herself for her son's mental illness. Taught pt basic CBT skills, thought emotion connection. Processed with pt alternative perspectives. Pt has decided she only wants to come to individual therapy and therefore will be discharged from the OP group. Worked with pt using her mindfulness techniques in session, with suggestions to use them at home and at school. Also suggested pt journal her thoughts and feelings during her son's hospital stay to deal with her emotions.    Suicidal/Homicidal: Nowithout intent/plan  Therapist Response: Assessed pt's current functioing by self report and reviewed progress. Assisted pt processing family relationships, cbt and  Mindfulness technique.  Plan: Return again in 2 weeks  Diagnosis: Axis I: Severe Major Depression without psychotic features    MACKENZIE,LISBETH S, LCAS 01/29/2017

## 2017-02-02 ENCOUNTER — Encounter (HOSPITAL_COMMUNITY): Payer: Self-pay | Admitting: Licensed Clinical Social Worker

## 2017-02-03 ENCOUNTER — Ambulatory Visit (HOSPITAL_COMMUNITY): Payer: Self-pay | Admitting: Licensed Clinical Social Worker

## 2017-02-10 ENCOUNTER — Ambulatory Visit (HOSPITAL_COMMUNITY): Payer: Self-pay | Admitting: Licensed Clinical Social Worker

## 2017-02-17 ENCOUNTER — Ambulatory Visit (HOSPITAL_COMMUNITY): Payer: Self-pay | Admitting: Licensed Clinical Social Worker

## 2017-02-24 ENCOUNTER — Ambulatory Visit (HOSPITAL_COMMUNITY): Payer: Self-pay | Admitting: Licensed Clinical Social Worker

## 2017-02-26 ENCOUNTER — Encounter (HOSPITAL_COMMUNITY): Payer: Self-pay | Admitting: Licensed Clinical Social Worker

## 2017-02-26 ENCOUNTER — Ambulatory Visit (INDEPENDENT_AMBULATORY_CARE_PROVIDER_SITE_OTHER): Payer: BC Managed Care – PPO | Admitting: Licensed Clinical Social Worker

## 2017-02-26 DIAGNOSIS — Z635 Disruption of family by separation and divorce: Secondary | ICD-10-CM

## 2017-02-26 DIAGNOSIS — F322 Major depressive disorder, single episode, severe without psychotic features: Secondary | ICD-10-CM

## 2017-02-26 DIAGNOSIS — Z6379 Other stressful life events affecting family and household: Secondary | ICD-10-CM | POA: Diagnosis not present

## 2017-02-26 DIAGNOSIS — F332 Major depressive disorder, recurrent severe without psychotic features: Secondary | ICD-10-CM

## 2017-02-26 NOTE — Progress Notes (Signed)
   THERAPIST PROGRESS NOTE  Session Time: 4:30-5:15pm  Participation Level: Active  Behavioral Response: CasualAlert/distressed  Type of Therapy: Individual Therapy  Treatment Goals addressed: Coping  Interventions: CBT  Summary: Sandra Mills is a 56 y.o. female who presents  for initial individual appointment.Pt discussed her psychiatric symptoms and current life events. Pt presented distressed today. Her father had another stroke Sunday and has been spending time in [place]. She and her siblings have difficult decisions to make about his care. Pt is still in the midst of her divorce proceedings and settlement. Her son has [facility] but has not followed through with any suggestions. 2 of her 4 children are angry with pt because she is divorcing their father. Pt is under a considerable amount of stress. Spent a considerable amount of time teaching pt meditation and grounding techniques.     Suicidal/Homicidal: Nowithout intent/plan  Therapist Response: Assessed pt's current functioing by self report and reviewed progress. Assisted pt processing family relationships,  and  Mindfulness and meditation techniques.  Plan: Return again in 2 weeks  Diagnosis: Axis I: Severe Major Depression without psychotic features    MACKENZIE,LISBETH S, LCAS 02/26/2017

## 2017-02-27 ENCOUNTER — Encounter (HOSPITAL_COMMUNITY): Payer: Self-pay | Admitting: Psychiatry

## 2017-02-27 ENCOUNTER — Ambulatory Visit (INDEPENDENT_AMBULATORY_CARE_PROVIDER_SITE_OTHER): Payer: BC Managed Care – PPO | Admitting: Psychiatry

## 2017-02-27 VITALS — BP 122/68 | HR 82 | Ht 62.5 in | Wt 170.0 lb

## 2017-02-27 DIAGNOSIS — F3342 Major depressive disorder, recurrent, in full remission: Secondary | ICD-10-CM | POA: Diagnosis not present

## 2017-02-27 DIAGNOSIS — F411 Generalized anxiety disorder: Secondary | ICD-10-CM | POA: Diagnosis not present

## 2017-02-27 MED ORDER — SERTRALINE HCL 100 MG PO TABS: 100.0000 mg | ORAL_TABLET | Freq: Every day | ORAL | 1 refills | Status: DC

## 2017-02-27 MED ORDER — GABAPENTIN 100 MG PO CAPS: ORAL_CAPSULE | ORAL | 1 refills | Status: DC

## 2017-02-27 NOTE — Progress Notes (Signed)
BH MD/PA/NP OP Progress Note  02/27/2017 9:30 AM Sandra Mills  MRN:  [id number]  Chief Complaint: Doing well  HPI: Patient reports that overall her medications are tolerated well, and her depression and anxiety appear to be very well controlled.  She has recently moved in with her boyfriend and they are having a wonderful time and adapting well to this transition.  She reports that she is sleeping well at night.  She reports that her sex drive has been healthy.  She does not present any acute safety issues or substance use issues.  She reports that her primary concern has been related to the anxiety of trying to encourage her son to get help.  I reminded her that we are happy to see him sooner and we have many resources available, and I encouraged her to give him our card and remind him to reach out.  She reports that she is excited for the holidays, and continues to spend time with her dog, go for walks, and overall appears to be doing well.  She describes some issues with skin picking of her fingernails, and we discussed some strategies for habit reversal.  Visit Diagnosis:    ICD-10-CM   1. Generalized anxiety disorder F41.1   2. Recurrent major depressive disorder, in full remission (HCC) F33.42 sertraline (ZOLOFT) 100 MG tablet    gabapentin (NEURONTIN) 100 MG capsule    Past Psychiatric History: See intake H&P for full details. Reviewed, with no updates at this time.   Past Medical History:  Past Medical History:  Diagnosis Date  . Anxiety   . Colon polyps 12/09/12   three polyps; [facility]; repeat in 3-5 years.  . Depression   . IBS (irritable bowel syndrome)    diarrhea predominant; s/p GI consult with colonoscopy  . Migraine    menstrually related.  . Tremor, essential     Past Surgical History:  Procedure Laterality Date  . COLONOSCOPY W/ POLYPECTOMY  12/09/12   three polyps; [place]. Repeat in 3-5 years.  . Cyst resection perineal      Family Psychiatric History: See  intake H&P for full details. Reviewed, with no updates at this time.   Family History:  Family History  Problem Relation Age of Onset  . Breast cancer Mother 71  . Cancer Mother 43       Breast cancer age 16  . Atrial fibrillation Father   . Atrial fibrillation Sister   . Breast cancer Other        maternal great aunt dx in her 12s  . Breast cancer Other        6 maternal great aunts dx in their 64s    Social History:  Social History   Social History  . Marital status: Legally Separated    Spouse name: 11/2015  . Number of children: 4  . Years of education: Master's   Occupational History  . 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 Smoking status: Never Smoker  . Smokeless tobacco: Never Used  . Alcohol use No  . Drug use: No  . Sexual activity: Yes    Birth control/ protection: Surgical     Comment: Husband vasectomy   Other Topics Concern  . None   Social History Narrative   Marital status:  Married x 31 years, no abuse. Separated 11/2015. Dating.      Lives: Lives alone, children (1993, 1995, 1997, 1999) live with their father  and visit her regulalry.  No grandchildren.      Children: 4 children; no grandchildren.      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  Previously worked [employer].   No tobacco/alcohol/drugs; wine three times per year.   Exercise sporadic; curves and [employer].   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      Allergies: No Known Allergies  Metabolic Disorder Labs: Lab Results  Component Value Date   HGBA1C 5.3 09/27/2016   MPG 105 09/27/2016   MPG 108 01/08/2015   No results found for: PROLACTIN Lab Results  Component Value Date   CHOL 191 09/27/2016   TRIG 135 09/27/2016   HDL 56 09/27/2016   CHOLHDL 3.4 09/27/2016   VLDL 27 09/27/2016   LDLCALC 108 (H) 09/27/2016   LDLCALC 91 01/08/2015   Lab Results  Component Value Date   TSH 2.917 09/27/2016   TSH 1.68 10/11/2015     Therapeutic Level Labs: No results found for: LITHIUM No results found for: VALPROATE No components found for:  CBMZ  Current Medications: Current Outpatient Prescriptions  Medication Sig Dispense Refill  . gabapentin (NEURONTIN) 100 MG capsule TAKE 2 CAPSULES BY MOUTH 2 TIMES DAILY 360 capsule 1  . sertraline (ZOLOFT) 100 MG tablet Take 1 tablet (100 mg total) by mouth daily. For depression 90 tablet 1  . cyclobenzaprine (FLEXERIL) 10 MG tablet Take 0.5-1 tablets (5-10 mg total) by mouth 3 (three) times daily as needed for muscle spasms. (Patient not taking: Reported on 02/27/2017) 30 tablet 0  . meloxicam (MOBIC) 15 MG tablet Take 1 tablet (15 mg total) by mouth daily. (Patient not taking: Reported on 02/27/2017) 30 tablet 1   No current facility-administered medications for this visit.      Musculoskeletal: Strength & Muscle Tone: within normal limits Gait & Station: normal Patient leans: N/A  Psychiatric Specialty Exam: ROS  Blood pressure 122/68, pulse 82, height 5' 2.5" (1.588 m), weight 170 lb (77.1 kg).Body mass index is 30.6 kg/m.  General Appearance: Casual and Well Groomed  Eye Contact:  Good  Speech:  Clear and Coherent  Volume:  Normal  Mood:  Euthymic  Affect:  Appropriate and Congruent  Thought Process:  Goal Directed and Descriptions of Associations: Intact  Orientation:  Full (Time, Place, and Person)  Thought Content: Logical   Suicidal Thoughts:  No  Homicidal Thoughts:  No  Memory:  Immediate;   Good  Judgement:  Good  Insight:  Good  Psychomotor Activity:  Normal  Concentration:  Concentration: Good  Recall:  Good  Fund of Knowledge: Good  Language: Good  Akathisia:  Negative  Handed:  Right  AIMS (if indicated): not done  Assets:  Communication Skills Desire for Improvement Financial Resources/Insurance Housing Intimacy Leisure Time Physical Health Resilience Social Support Talents/Skills Transportation Vocational/Educational   ADL's:  Intact  Cognition: WNL  Sleep:  Good   Screenings: AIMS     [facility]  AIMS Total Score  0    AUDIT     Ad[facility] ([facility]  Alcohol Use Disorder Identification Test Final Score (AUDIT)  0    PHQ2-9     Office Visit from 01/21/2017 in Primary Care at [facility] Visit from 10/13/2016 in Primary Care at [facility] Visit from 09/01/2016 in Primary Care at [facility] Visit from 07/18/2016 in Primary Care at [facility] Visit from  12/14/2015 [facility]  PHQ-2 Total Score  0  4  6  2  2   PHQ-9 Total Score  -  16  15  7  8        Assessment and Plan:  Candiss NorseDonna F Favila presents with full remission of depressive symptoms, and good control of generalized anxiety.  No acute safety issues and she has had continued positive improvements in her social and romantic life.  We will continue as below and follow-up in 3-4 months.  1. Generalized anxiety disorder   2. Recurrent major depressive disorder, in full remission (HCC)     Status of current problems: stable  Labs Ordered: No orders of the defined types were placed in this encounter.   Labs Reviewed: NA  Collateral Obtained/Records Reviewed: N/A  Plan:  Continue Zoloft 100 mg daily Continue gabapentin 200 mg twice daily for anxiety Return to clinic in 3-4 months Continue in individual therapy  I spent 20 minutes with the patient in direct face-to-face clinical care.  Greater than 50% of this time was spent in counseling and coordination of care with the patient.    Burnard LeighAlexander Arya Ramere Downs, MD 02/27/2017, 9:30 AM

## 2017-03-12 ENCOUNTER — Ambulatory Visit (HOSPITAL_COMMUNITY): Payer: BC Managed Care – PPO | Admitting: Licensed Clinical Social Worker

## 2017-03-12 ENCOUNTER — Encounter (HOSPITAL_COMMUNITY): Payer: Self-pay | Admitting: Licensed Clinical Social Worker

## 2017-03-12 DIAGNOSIS — F411 Generalized anxiety disorder: Secondary | ICD-10-CM | POA: Diagnosis not present

## 2017-03-12 NOTE — Progress Notes (Signed)
   THERAPIST PROGRESS NOTE  Session Time: 4:30-5:15pm  Participation Level: Active  Behavioral Response: CasualAlert/distressed  Type of Therapy: Individual Therapy  Treatment Goals addressed: Coping  Interventions: CBT  Summary: Sandra Mills is a 56 y.o. female who presents  for initial individual appointment.Pt discussed her psychiatric symptoms and current life events. Pt presented distressed today. Her father had a second stroke and has been spending time in [place]. She and her siblings have difficult decisions to make about his care. Pt is still in the midst of her divorce proceedings and settlement. Discussed her new normal for holdiays today. Pt was stressed about the holidays. Processed this with pt. Pt has been making some courageous choices and asking for what she wants. Validated pt on her choices. Taught pt stages of grief and loss. Spent a considerable amount of time teaching pt meditation and grounding techniques to use during the holiday season.     Suicidal/Homicidal: Nowithout intent/plan  Therapist Response: Assessed pt's current functioing by self report and reviewed progress. Assisted pt processing family relationships, grief loss, courage  and  Mindfulness and meditation techniques.  Plan: Return again in 2 weeks   Diagnosis:   Axis I: Severe Major Depression without psychotic features    Nimisha Rathel S, LCAS 03/12/2017

## 2017-03-21 ENCOUNTER — Other Ambulatory Visit: Payer: Self-pay | Admitting: Physician Assistant

## 2017-03-21 DIAGNOSIS — R079 Chest pain, unspecified: Secondary | ICD-10-CM

## 2017-03-24 ENCOUNTER — Ambulatory Visit (HOSPITAL_COMMUNITY): Payer: Self-pay | Admitting: Licensed Clinical Social Worker

## 2017-04-09 ENCOUNTER — Ambulatory Visit (INDEPENDENT_AMBULATORY_CARE_PROVIDER_SITE_OTHER): Payer: BC Managed Care – PPO | Admitting: Licensed Clinical Social Worker

## 2017-04-09 DIAGNOSIS — F411 Generalized anxiety disorder: Secondary | ICD-10-CM | POA: Diagnosis not present

## 2017-04-09 DIAGNOSIS — F332 Major depressive disorder, recurrent severe without psychotic features: Secondary | ICD-10-CM | POA: Diagnosis not present

## 2017-04-14 ENCOUNTER — Encounter (HOSPITAL_COMMUNITY): Payer: Self-pay | Admitting: Licensed Clinical Social Worker

## 2017-04-14 NOTE — Progress Notes (Signed)
   THERAPIST PROGRESS NOTE  Session Time: 4:30-5:15pm  Participation Level: Active  Behavioral Response: CasualAlert/distressed  Type of Therapy: Individual Therapy  Treatment Goals addressed: Coping  Interventions: CBT  Summary: Sandra Mills is a 56 y.o. female who presents  for initial individual appointment.Pt discussed her psychiatric symptoms and current life events. Pt presented distressed today. Her father is not doing well and has been transferred to a skilled nursing facility. Discussed [facility] with pt. And encouraged pt to look into their services. Pt is entering into a new phase of life. Processed with pt her new normal. Taught pt a mindfulness exercise and encouraged her to use it during this time..     Suicidal/Homicidal: Nowithout intent/plan  Therapist Response: Assessed pt's current functioing by self report and reviewed progress. Assisted pt processing family relationships, grief loss,  and  Mindfulness techniques.  Plan: Return again in 2 weeks   Diagnosis:   Axis I: Severe Major Depression without psychotic features    Tiandre Teall S, LCAS 04/09/17

## 2017-04-23 ENCOUNTER — Ambulatory Visit (HOSPITAL_COMMUNITY): Payer: Self-pay | Admitting: Licensed Clinical Social Worker

## 2017-06-01 ENCOUNTER — Ambulatory Visit (HOSPITAL_COMMUNITY): Payer: Self-pay | Admitting: Psychiatry

## 2017-06-05 ENCOUNTER — Encounter (HOSPITAL_COMMUNITY): Payer: Self-pay | Admitting: Psychiatry

## 2017-07-03 ENCOUNTER — Encounter: Payer: Self-pay | Admitting: Physician Assistant

## 2017-07-03 ENCOUNTER — Ambulatory Visit: Payer: BC Managed Care – PPO | Admitting: Physician Assistant

## 2017-07-03 ENCOUNTER — Other Ambulatory Visit: Payer: Self-pay

## 2017-07-03 VITALS — BP 121/75 | HR 73 | Temp 98.2°F | Resp 16 | Ht 63.0 in | Wt 183.2 lb

## 2017-07-03 DIAGNOSIS — M272 Inflammatory conditions of jaws: Secondary | ICD-10-CM

## 2017-07-03 DIAGNOSIS — K0889 Other specified disorders of teeth and supporting structures: Secondary | ICD-10-CM | POA: Diagnosis not present

## 2017-07-03 LAB — POCT CBC
Granulocyte percent: 58.5 %G (ref 37–80)
HCT, POC: 38.8 % (ref 37.7–47.9)
Hemoglobin: 12.9 g/dL (ref 12.2–16.2)
Lymph, poc: 2.6 (ref 0.6–3.4)
MCH, POC: 30.1 pg (ref 27–31.2)
MCHC: 33.3 g/dL (ref 31.8–35.4)
MCV: 90.5 fL (ref 80–97)
MID (cbc): 0.3 (ref 0–0.9)
MPV: 7 fL (ref 0–99.8)
POC Granulocyte: 4.2 (ref 2–6.9)
POC LYMPH PERCENT: 37 %L (ref 10–50)
POC MID %: 4.5 % (ref 0–12)
Platelet Count, POC: 301 10*3/uL (ref 142–424)
RBC: 4.29 M/uL (ref 4.04–5.48)
RDW, POC: 13.8 %
WBC: 7.1 10*3/uL (ref 4.6–10.2)

## 2017-07-03 MED ORDER — AMOXICILLIN-POT CLAVULANATE 875-125 MG PO TABS: 1.0000 | ORAL_TABLET | Freq: Two times a day (BID) | ORAL | 0 refills | Status: DC

## 2017-07-03 NOTE — Progress Notes (Signed)
Sandra Mills  MRN: [id number] DOB: Sep 11, 1960  PCP: Porfirio Oar, PA-C  Subjective:  Pt is a 57 year old female who presents to clinic for mouth pain. She received surgery for implant on her upper left tooth about 6 months ago. She was without pain or discomfort until a few months ago. Pain is located around top of the tooth on left side going down into the neck x 3 mos, has gotten worse in the past couple of wks.  Started feeling sick 1.5 weeks ago. On and off nausea x 1 week. Bad taste and odor x a few months.  Denies fever, chills, swelling, drainage.  She has appt to complete implant next week.   Review of Systems  Constitutional: Negative for chills, diaphoresis, fatigue and fever.  HENT: Positive for dental problem. Negative for drooling, facial swelling, sore throat and trouble swallowing.   Respiratory: Negative for choking.   Cardiovascular: Negative for chest pain and palpitations.  Neurological: Negative for dizziness, light-headedness and headaches.    Patient Active Problem List   Diagnosis Date Noted  . Generalized anxiety disorder 02/27/2017  . Recurrent major depressive disorder, in full remission (HCC) 12/03/2016  . Migraine, menstrual 06/23/2007    Current Outpatient Medications on File Prior to Visit  Medication Sig Dispense Refill  . gabapentin (NEURONTIN) 100 MG capsule TAKE 2 CAPSULES BY MOUTH 2 TIMES DAILY 360 capsule 1  . sertraline (ZOLOFT) 100 MG tablet Take 1 tablet (100 mg total) by mouth daily. For depression 90 tablet 1  . cyclobenzaprine (FLEXERIL) 10 MG tablet Take 0.5-1 tablets (5-10 mg total) by mouth 3 (three) times daily as needed for muscle spasms. (Patient not taking: Reported on 02/27/2017) 30 tablet 0  . meloxicam (MOBIC) 15 MG tablet Take 1 tablet (15 mg total) by mouth daily. (Patient not taking: Reported on 02/27/2017) 30 tablet 1   No current facility-administered medications on file prior to visit.     No Known Allergies     Objective:  BP 121/75   Pulse 73   Temp 98.2 F (36.8 C) (Oral)   Resp 16   Ht 5\' 3"  (1.6 m)   Wt 183 lb 3.2 oz (83.1 kg)   SpO2 96%   BMI 32.45 kg/m   Physical Exam  Constitutional: She is oriented to person, place, and time and well-developed, well-nourished, and in no distress. No distress.  HENT:  Mouth/Throat:    Lymphadenopathy:    She has no cervical adenopathy.  Neurological: She is alert and oriented to person, place, and time. GCS score is 15.  Skin: Skin is warm and dry.  Psychiatric: Mood, memory, affect and judgment normal.  Vitals reviewed.    Results for orders placed or performed in visit on 07/03/17  POCT CBC  Result Value Ref Range   WBC 7.1 4.6 - 10.2 K/uL   Lymph, poc 2.6 0.6 - 3.4   POC LYMPH PERCENT 37.0 10 - 50 %L   MID (cbc) 0.3 0 - 0.9   POC MID % 4.5 0 - 12 %M   POC Granulocyte 4.2 2 - 6.9   Granulocyte percent 58.5 37 - 80 %G   RBC 4.29 4.04 - 5.48 M/uL   Hemoglobin 12.9 12.2 - 16.2 g/dL   HCT, POC 40.9 81.1 - 47.9 %   MCV 90.5 80 - 97 fL   MCH, POC 30.1 27 - 31.2 pg   MCHC 33.3 31.8 - 35.4 g/dL   RDW, POC 91.4 %  Platelet Count, POC 301 142 - 424 K/uL   MPV 7.0 0 - 99.8 fL    Assessment and Plan :  1. Tooth pain 2. Odontogenic infection of jaw - POCT CBC - amoxicillin-clavulanate (AUGMENTIN) 875-125 MG tablet; Take 1 tablet by mouth 2 (two) times daily.  Dispense: 14 tablet; Refill: 0 - Pt presents with worsening pain of tooth implant. She has had this implant x 6 months, however recently became painful which worsened over the past few weeks with bad odor and taste. I did not see sign of infection on PE and WBC count is wnl, however he HPI suggests possible infection. Plan to cover with augmentin. Advised pt to f/u with dentist, however she known she is more than welcome to RTC if needed.   Marco CollieWhitney Rohaan Durnil, PA-C  Primary Care at [facility] 07/03/2017 5:25 PM

## 2017-07-03 NOTE — Patient Instructions (Addendum)
Your white blood cell count is within normal limits.  Start taking augmentin twice daily x 1 week for possible infection of your gums.  Take tylenol for pain.  Feel free to come back if you are not improving.  Be sure to follow-up with your dentist  Thank you for coming in today. I hope you feel we met your needs.  Feel free to call PCP if you have any questions or further requests.  Please consider signing up for MyChart if you do not already have it, as this is a great way to communicate with me.  Best,  Whitney McVey, PA-C  IF you received an x-ray today, you will receive an invoice from [facility]. Please contact [facility] at [phone] with questions or concerns regarding your invoice.   IF you received labwork today, you will receive an invoice from [facility]. Please contact [facility] at [phone] with questions or concerns regarding your invoice.   Our billing staff will not be able to assist you with questions regarding bills from these companies.  You will be contacted with the lab results as soon as they are available. The fastest way to get your results is to activate your My Chart account. Instructions are located on the last page of this paperwork. If you have not heard from us regarding the results in 2 weeks, please contact this office.     

## 2017-07-21 ENCOUNTER — Encounter: Payer: Self-pay | Admitting: Physician Assistant

## 2017-07-21 ENCOUNTER — Ambulatory Visit: Payer: BC Managed Care – PPO | Admitting: Physician Assistant

## 2017-07-21 VITALS — BP 128/74 | HR 97 | Temp 98.0°F | Resp 16 | Ht 63.0 in | Wt 182.4 lb

## 2017-07-21 DIAGNOSIS — J029 Acute pharyngitis, unspecified: Secondary | ICD-10-CM | POA: Diagnosis not present

## 2017-07-21 DIAGNOSIS — J0100 Acute maxillary sinusitis, unspecified: Secondary | ICD-10-CM | POA: Diagnosis not present

## 2017-07-21 LAB — POCT RAPID STREP A (OFFICE): Rapid Strep A Screen: NEGATIVE

## 2017-07-21 MED ORDER — GUAIFENESIN ER 1200 MG PO TB12: 1.0000 | ORAL_TABLET | Freq: Two times a day (BID) | ORAL | 1 refills | Status: DC | PRN

## 2017-07-21 MED ORDER — BENZONATATE 100 MG PO CAPS: 100.0000 mg | ORAL_CAPSULE | Freq: Three times a day (TID) | ORAL | 0 refills | Status: DC | PRN

## 2017-07-21 MED ORDER — PREDNISONE 20 MG PO TABS: ORAL_TABLET | ORAL | 0 refills | Status: DC

## 2017-07-21 MED ORDER — AMOXICILLIN-POT CLAVULANATE 875-125 MG PO TABS: 1.0000 | ORAL_TABLET | Freq: Two times a day (BID) | ORAL | 0 refills | Status: DC

## 2017-07-21 MED ORDER — AZELASTINE HCL 0.1 % NA SOLN: 2.0000 | Freq: Two times a day (BID) | NASAL | 0 refills | Status: DC

## 2017-07-21 NOTE — Patient Instructions (Signed)
     IF you received an x-ray today, you will receive an invoice from [facility]. Please contact [facility] at [phone] with questions or concerns regarding your invoice.   IF you received labwork today, you will receive an invoice from [facility]. Please contact [facility] at [phone] with questions or concerns regarding your invoice.   Our billing staff will not be able to assist you with questions regarding bills from these companies.  You will be contacted with the lab results as soon as they are available. The fastest way to get your results is to activate your My Chart account. Instructions are located on the last page of this paperwork. If you have not heard from us regarding the results in 2 weeks, please contact this office.     

## 2017-07-21 NOTE — Progress Notes (Signed)
Patient ID: Sandra Mills, female    DOB: 1960-07-31, 57 y.o.   MRN: [id number]  PCP: Porfirio Oar, PA-C  Chief Complaint  Patient presents with  . Sore Throat    Subjective:   Presents for evaluation of sore throat.  Symptoms began with terrible sore throat. Gradual onset beginning 6 days ago. Nasal congestion, drainage, nausea, loss of appetite headache. Initially, had fever and chills. Feels achy all over. "My skin hurts." "medium" cough productive of "gunk," same as blowing out of the nose. No diarrhea. No rash. Her boyfriend and her son have had similar symptoms.  Received a flu vaccine in 12/2016.  Tried NyQuil and acetaminophen (to be able to swallow).  Review of Systems As above.    Patient Active Problem List   Diagnosis Date Noted  . Generalized anxiety disorder 02/27/2017  . Recurrent major depressive disorder, in full remission (HCC) 12/03/2016  . Migraine, menstrual 06/23/2007     Prior to Admission medications   Medication Sig Start Date End Date Taking? Authorizing Provider  gabapentin (NEURONTIN) 100 MG capsule TAKE 2 CAPSULES BY MOUTH 2 TIMES DAILY 02/27/17  YES Rene Kocher, Bo Mcclintock, MD  sertraline (ZOLOFT) 100 MG tablet Take 1 tablet (100 mg total) by mouth daily. For depression 02/27/17  YES Eksir, Bo Mcclintock, MD     No Known Allergies     Objective:  Physical Exam  Constitutional: She is oriented to person, place, and time. She appears well-developed and well-nourished. She is cooperative. She appears ill. No distress.  BP 128/74   Pulse 97   Temp 98 F (36.7 C)   Resp 16   Ht 5\' 3"  (1.6 m)   Wt 182 lb 6.4 oz (82.7 kg)   SpO2 97%   BMI 32.31 kg/m    HENT:  Head: Normocephalic and atraumatic.  Right Ear: Hearing, tympanic membrane, external ear and ear canal normal.  Left Ear: Hearing, tympanic membrane, external ear and ear canal normal.  Nose: Mucosal edema and rhinorrhea present.  No foreign bodies. Right sinus  exhibits no maxillary sinus tenderness and no frontal sinus tenderness. Left sinus exhibits no maxillary sinus tenderness and no frontal sinus tenderness.  Mouth/Throat: Uvula is midline, oropharynx is clear and moist and mucous membranes are normal. No uvula swelling. No oropharyngeal exudate.  Eyes: Pupils are equal, round, and reactive to light. Conjunctivae and EOM are normal. Right eye exhibits no discharge. Left eye exhibits no discharge. No scleral icterus.  Neck: Trachea normal, normal range of motion and full passive range of motion without pain. Neck supple. No thyroid mass and no thyromegaly present.  Cardiovascular: Normal rate, regular rhythm and normal heart sounds.  Pulmonary/Chest: Effort normal and breath sounds normal.  Lymphadenopathy:       Head (right side): No submandibular, no tonsillar, no preauricular, no posterior auricular and no occipital adenopathy present.       Head (left side): No submandibular, no tonsillar, no preauricular and no occipital adenopathy present.    She has no cervical adenopathy.       Right: No supraclavicular adenopathy present.       Left: No supraclavicular adenopathy present.  Neurological: She is alert and oriented to person, place, and time. She has normal strength. No cranial nerve deficit or sensory deficit.  Skin: Skin is warm, dry and intact. No rash noted.  Psychiatric: She has a normal mood and affect. Her speech is normal and behavior is normal.    Results for  orders placed or performed in visit on 07/21/17  POCT rapid strep A  Result Value Ref Range   Rapid Strep A Screen Negative Negative          Assessment & Plan:   1. Sore throat Await culture, but doubt strep throat. Suspect pain is due to post nasal drainage due to #2. - POCT rapid strep A - Culture, Group A Strep  2. Acute non-recurrent maxillary sinusitis Supportive care.  Anticipatory guidance.  RTC if symptoms worsen/persist. - amoxicillin-clavulanate  (AUGMENTIN) 875-125 MG tablet; Take 1 tablet by mouth 2 (two) times daily.  Dispense: 14 tablet; Refill: 0 - azelastine (ASTELIN) 0.1 % nasal spray; Place 2 sprays into both nostrils 2 (two) times daily. Use in each nostril as directed  Dispense: 30 mL; Refill: 0 - Guaifenesin (MUCINEX MAXIMUM STRENGTH) 1200 MG TB12; Take 1 tablet (1,200 mg total) by mouth every 12 (twelve) hours as needed.  Dispense: 14 tablet; Refill: 1 - predniSONE (DELTASONE) 20 MG tablet; Take 3 PO QAM x3days, 2 PO QAM x3days, 1 PO QAM x3days  Dispense: 18 tablet; Refill: 0 - benzonatate (TESSALON) 100 MG capsule; Take 1-2 capsules (100-200 mg total) by mouth 3 (three) times daily as needed for cough.  Dispense: 40 capsule; Refill: 0    Return if symptoms worsen or fail to improve.   Fernande Brashelle S. Nasra Counce, PA-C Primary Care at [facility]

## 2017-07-24 LAB — CULTURE, GROUP A STREP: STREP A CULTURE: NEGATIVE

## 2017-08-03 ENCOUNTER — Encounter: Payer: Self-pay | Admitting: Physician Assistant

## 2017-08-12 ENCOUNTER — Other Ambulatory Visit: Payer: Self-pay | Admitting: Physician Assistant

## 2017-08-12 DIAGNOSIS — J0100 Acute maxillary sinusitis, unspecified: Secondary | ICD-10-CM

## 2017-09-03 ENCOUNTER — Encounter (HOSPITAL_COMMUNITY): Payer: Self-pay | Admitting: Psychiatry

## 2017-09-21 ENCOUNTER — Encounter: Payer: Self-pay | Admitting: Family Medicine

## 2017-10-05 ENCOUNTER — Other Ambulatory Visit: Payer: Self-pay | Admitting: Physician Assistant

## 2017-10-05 DIAGNOSIS — J0100 Acute maxillary sinusitis, unspecified: Secondary | ICD-10-CM

## 2017-10-09 ENCOUNTER — Encounter (HOSPITAL_COMMUNITY): Payer: Self-pay | Admitting: Psychiatry

## 2017-10-09 ENCOUNTER — Ambulatory Visit (HOSPITAL_COMMUNITY): Payer: BC Managed Care – PPO | Admitting: Psychiatry

## 2017-10-09 DIAGNOSIS — F3342 Major depressive disorder, recurrent, in full remission: Secondary | ICD-10-CM | POA: Diagnosis not present

## 2017-10-09 DIAGNOSIS — Z79899 Other long term (current) drug therapy: Secondary | ICD-10-CM

## 2017-10-09 MED ORDER — GABAPENTIN 100 MG PO CAPS: ORAL_CAPSULE | ORAL | 3 refills | Status: DC

## 2017-10-09 MED ORDER — SERTRALINE HCL 100 MG PO TABS: 100.0000 mg | ORAL_TABLET | Freq: Every day | ORAL | 3 refills | Status: DC

## 2017-10-09 NOTE — Progress Notes (Signed)
BH MD/PA/NP OP Progress Note  10/09/2017 10:00 AM Sandra Mills  MRN:  [id number]  Chief Complaint: really doing well HPI: Sandra Mills  Has been stable for nearly 1 year on current regimen. No safety issues, things going well with work. No side effects from zoloft or gabapentin. Sleeping well.  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    Agrees to continue current regimen and follow up prn if any issues arise. Can get refills with pcp at routine follow up visits.    Visit Diagnosis:    ICD-10-CM   1. Recurrent major depressive disorder, in full remission (HCC) F33.42 sertraline (ZOLOFT) 100 MG tablet    gabapentin (NEURONTIN) 100 MG capsule    Past Psychiatric History: See intake H&P for full details. Reviewed, with no updates at this time.   Past Medical History:  Past Medical History:  Diagnosis Date  . Anxiety   . Colon polyps 12/09/12   three polyps; [place]; repeat in 3-5 years.  . Depression   . IBS (irritable bowel syndrome)    diarrhea predominant; s/p GI consult with colonoscopy  . Migraine    menstrually related.  . Tremor, essential     Past Surgical History:  Procedure Laterality Date  . COLONOSCOPY W/ POLYPECTOMY  12/09/12   three polyps; [place]. Repeat in 3-5 years.  . Cyst resection perineal      Family Psychiatric History: See intake H&P for full details. Reviewed, with no updates at this time.   Family History:  Family History  Problem Relation Age of Onset  . Breast cancer Mother 89  . Cancer Mother 84       Breast cancer age 30  . Atrial fibrillation Father   . Atrial fibrillation Sister   . Breast cancer Other        maternal great aunt dx in her 33s  . Breast cancer Other        6 maternal great aunts dx in their 66s    Social History:  Social History   Socioeconomic History  . Marital status: Legally Separated    Spouse name: 11/2015  . Number of children: 4  . Years of education: Master's  . Highest education  level: Not on file  Occupational History  . Occupation: Magazine features editor: [employer]    Comment: Music (choir/voice)  Social Needs  . Financial resource strain: Not on file  . Food insecurity:    Worry: Not on file    Inability: Not on file  . Transportation needs:    Medical: Not on file    Non-medical: Not on file  Tobacco Use  . Smoking status: Never Smoker  . Smokeless tobacco: Never Used  Substance and Sexual Activity  . Alcohol use: No  . Drug use: No  . Sexual activity: Yes    Birth control/protection: Surgical    Comment: Husband vasectomy  Lifestyle  . Physical activity:    Days per week: Not on file    Minutes per session: Not on file  . Stress: Not on file  Relationships  . Social connections:    Talks on phone: Not on file    Gets together: Not on file    Attends religious service: Not on file    Active member of club or organization: Not on file    Attends meetings of clubs or organizations: Not on file    Relationship status: Not on file  Other Topics Concern  .  Not on file  Social History Narrative   Marital status:  Married x 31 years, no abuse. Separated 11/2015. Dating.      Lives: Lives alone, children (1993, 1995, 1997, 1999) live with their father and visit her regulalry.  No grandchildren.      Children: 4 children; no grandchildren.      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  Previously worked [employer].   No tobacco/alcohol/drugs; wine three times per year.   Exercise sporadic; curves and [employer].   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      Allergies: No Known Allergies  Metabolic Disorder Labs: Lab Results  Component Value Date   HGBA1C 5.3 09/27/2016   MPG 105 09/27/2016   MPG 108 01/08/2015   No results found for: PROLACTIN Lab Results  Component Value Date   CHOL 191 09/27/2016   TRIG 135 09/27/2016   HDL 56 09/27/2016   CHOLHDL 3.4 09/27/2016   VLDL 27 09/27/2016   LDLCALC 108 (H) 09/27/2016   LDLCALC  91 01/08/2015   Lab Results  Component Value Date   TSH 2.917 09/27/2016   TSH 1.68 10/11/2015    Therapeutic Level Labs: No results found for: LITHIUM No results found for: VALPROATE No components found for:  CBMZ  Current Medications: Current Outpatient Medications  Medication Sig Dispense Refill  . amoxicillin-clavulanate (AUGMENTIN) 875-125 MG tablet Take 1 tablet by mouth 2 (two) times daily. 14 tablet 0  . azelastine (ASTELIN) 0.1 % nasal spray PLACE 2 SPRAYS INTO BOTH NOSTRILS 2 (TWO) TIMES DAILY. USE IN EACH NOSTRIL AS DIRECTED 30 mL 0  . benzonatate (TESSALON) 100 MG capsule Take 1-2 capsules (100-200 mg total) by mouth 3 (three) times daily as needed for cough. 40 capsule 0  . gabapentin (NEURONTIN) 100 MG capsule TAKE 2 CAPSULES BY MOUTH 2 TIMES DAILY 360 capsule 3  . Guaifenesin (MUCINEX MAXIMUM STRENGTH) 1200 MG TB12 Take 1 tablet (1,200 mg total) by mouth every 12 (twelve) hours as needed. 14 tablet 1  . predniSONE (DELTASONE) 20 MG tablet Take 3 PO QAM x3days, 2 PO QAM x3days, 1 PO QAM x3days 18 tablet 0  . sertraline (ZOLOFT) 100 MG tablet Take 1 tablet (100 mg total) by mouth daily. For depression 90 tablet 3   No current facility-administered medications for this visit.      Musculoskeletal: Strength & Muscle Tone: within normal limits Gait & Station: normal Patient leans: N/A  Psychiatric Specialty Exam: ROS  There were no vitals taken for this visit.There is no height or weight on file to calculate BMI.  General Appearance: Casual and Well Groomed  Eye Contact:  Good  Speech:  Clear and Coherent and Normal Rate  Volume:  Normal  Mood:  Euthymic  Affect:  Appropriate and Congruent  Thought Process:  Coherent and Descriptions of Associations: Intact  Orientation:  Full (Time, Place, and Person)  Thought Content: Logical   Suicidal Thoughts:  No  Homicidal Thoughts:  No  Memory:  Immediate;   Good  Judgement:  Good  Insight:  Good  Psychomotor  Activity:  Normal  Concentration:  Concentration: Good  Recall:  Good  Fund of Knowledge: Good  Language: Good  Akathisia:  Negative  Handed:  Right  AIMS (if indicated): not done  Assets:  Communication Skills Desire for Improvement Financial Resources/Insurance Housing Intimacy Leisure Time Physical Health Resilience Social Support Talents/Skills Transportation Vocational/Educational  ADL's:  Intact  Cognition: WNL  Sleep:  Good  Screenings: AIMS     [facility]  AIMS Total Score  0    AUDIT     [facility]  Alcohol Use Disorder Identification Test Final Score (AUDIT)  0    PHQ2-9     [facility]  PHQ-2 Total Score  0  0  0  4  6  PHQ-9 Total Score  -  -  -  16  15       Assessment and Plan:  Sandra Mills presents with stable mood, anxiety, sleep for approximately 1 year.  Will continue current regimen, will likely require lifelong SSRI given recurrence of mood symptoms in the past.  No acute safety issues.  Appropriate for return PRN and can get refills with PCP at annual visits.    1. Recurrent major depressive disorder, in full remission (HCC)     Status of current problems: stable  Labs Ordered: No orders of the defined types were placed in this encounter.   Labs Reviewed: n/a  Collateral Obtained/Records Reviewed: n/a  Plan:  Follow up prn Continue zoloft 100 mg daily, and gabapentin 200 mg BID   Burnard LeighAlexander Arya Cecily Lawhorne, MD 10/09/2017, 10:00 AM

## 2017-11-12 ENCOUNTER — Encounter (HOSPITAL_COMMUNITY): Payer: Self-pay | Admitting: Psychiatry

## 2017-12-15 ENCOUNTER — Ambulatory Visit: Payer: BC Managed Care – PPO | Admitting: Physician Assistant

## 2017-12-19 ENCOUNTER — Encounter: Payer: Self-pay | Admitting: Osteopathic Medicine

## 2017-12-19 ENCOUNTER — Other Ambulatory Visit: Payer: Self-pay

## 2017-12-19 ENCOUNTER — Ambulatory Visit (INDEPENDENT_AMBULATORY_CARE_PROVIDER_SITE_OTHER): Payer: BC Managed Care – PPO

## 2017-12-19 ENCOUNTER — Ambulatory Visit: Payer: BC Managed Care – PPO | Admitting: Osteopathic Medicine

## 2017-12-19 VITALS — BP 136/94 | HR 86 | Temp 97.6°F | Resp 16 | Ht 64.37 in | Wt 185.0 lb

## 2017-12-19 DIAGNOSIS — R05 Cough: Secondary | ICD-10-CM

## 2017-12-19 DIAGNOSIS — R0989 Other specified symptoms and signs involving the circulatory and respiratory systems: Secondary | ICD-10-CM

## 2017-12-19 DIAGNOSIS — J181 Lobar pneumonia, unspecified organism: Secondary | ICD-10-CM

## 2017-12-19 DIAGNOSIS — J189 Pneumonia, unspecified organism: Secondary | ICD-10-CM

## 2017-12-19 DIAGNOSIS — R059 Cough, unspecified: Secondary | ICD-10-CM

## 2017-12-19 MED ORDER — DOXYCYCLINE HYCLATE 100 MG PO TABS: 100.0000 mg | ORAL_TABLET | Freq: Two times a day (BID) | ORAL | 0 refills | Status: DC

## 2017-12-19 MED ORDER — IPRATROPIUM-ALBUTEROL 0.5-2.5 (3) MG/3ML IN SOLN: 3.0000 mL | Freq: Once | RESPIRATORY_TRACT | Status: DC

## 2017-12-19 MED ORDER — IPRATROPIUM BROMIDE 0.02 % IN SOLN
0.5000 mg | Freq: Once | RESPIRATORY_TRACT | Status: AC
  Administered 2017-12-19: 0.5 mg via RESPIRATORY_TRACT

## 2017-12-19 MED ORDER — ALBUTEROL SULFATE HFA 108 (90 BASE) MCG/ACT IN AERS
2.0000 | INHALATION_SPRAY | Freq: Four times a day (QID) | RESPIRATORY_TRACT | 1 refills | Status: DC | PRN
Start: 2017-12-19 — End: 2020-01-31

## 2017-12-19 MED ORDER — BENZONATATE 200 MG PO CAPS: 200.0000 mg | ORAL_CAPSULE | Freq: Three times a day (TID) | ORAL | 0 refills | Status: DC | PRN

## 2017-12-19 MED ORDER — ALBUTEROL SULFATE (2.5 MG/3ML) 0.083% IN NEBU
2.5000 mg | INHALATION_SOLUTION | Freq: Once | RESPIRATORY_TRACT | Status: AC
  Administered 2017-12-19: 2.5 mg via RESPIRATORY_TRACT

## 2017-12-19 NOTE — Progress Notes (Signed)
HPI: Sandra Mills is a 57 y.o. female who  has a past medical history of Anxiety, Colon polyps (12/09/12), Depression, IBS (irritable bowel syndrome), Migraine, and Tremor, essential.  she presents to [facility] today, 12/19/17,  for chief complaint of: Congestion  . Context: was told had bronchitis few weeks ago in [facility]. Was given prednisone, still having congestion. No hx asthma or other respiratory problems. No smoking or 2nd hand smoke exposure.  . Location/Quality: chest congestion, feels like rattling on the R side . Duration: 3 weeks, getting worse . Modifying factors: prednisone not helping  . Assoc signs/symptoms: wheezing, SOB worse at night     Past medical, surgical, social and family history reviewed:  Patient Active Problem List   Diagnosis Date Noted  . Generalized anxiety disorder 02/27/2017  . Recurrent major depressive disorder, in full remission (HCC) 12/03/2016  . Migraine, menstrual 06/23/2007    Past Surgical History:  Procedure Laterality Date  . COLONOSCOPY W/ POLYPECTOMY  12/09/12   three polyps; [place]. Repeat in 3-5 years.  . Cyst resection perineal      Social History   Tobacco Use  . Smoking status: Never Smoker  . Smokeless tobacco: Never Used  Substance Use Topics  . Alcohol use: No    Family History  Problem Relation Age of Onset  . Breast cancer Mother 40  . Cancer Mother 17       Breast cancer age 20  . Atrial fibrillation Father   . Atrial fibrillation Sister   . Breast cancer Other        maternal great aunt dx in her 40s  . Breast cancer Other        6 maternal great aunts dx in their 58s     Current medication list and allergy/intolerance information reviewed:    Current Outpatient Medications  Medication Sig Dispense Refill  . azelastine (ASTELIN) 0.1 % nasal spray PLACE 2 SPRAYS INTO BOTH NOSTRILS 2 (TWO) TIMES DAILY. USE IN EACH NOSTRIL AS DIRECTED 30 mL 0  . gabapentin (NEURONTIN) 100  MG capsule TAKE 2 CAPSULES BY MOUTH 2 TIMES DAILY 360 capsule 3  . sertraline (ZOLOFT) 100 MG tablet Take 1 tablet (100 mg total) by mouth daily. For depression 90 tablet 3   No current facility-administered medications for this visit.     No Known Allergies    Review of Systems:  Constitutional:  No  fever, no chills, +recent illness, No unintentional weight changes. +significant fatigue.   HEENT: No  headache, no vision change, no hearing change, No sore throat, No  sinus pressure  Cardiac: No  chest pain, No  pressure, No palpitations  Respiratory:  +shortness of breath. +Cough  Gastrointestinal: No  abdominal pain, No  nausea, No  vomiting,  No  blood in stool, No  diarrhea  Musculoskeletal: No new myalgia/arthralgia  Skin: No  Rash  Neurologic: +generalized weakness, No  dizziness  Exam:  BP (!) 136/94   Pulse 86   Temp 97.6 F (36.4 C) (Oral)   Resp 16   Ht 5' 4.37" (1.635 m)   Wt 185 lb (83.9 kg)   SpO2 94%   BMI 31.39 kg/m   Constitutional: VS see above. General Appearance: alert, well-developed, well-nourished, NAD  Eyes: Normal lids and conjunctive, non-icteric sclera  Ears, Nose, Mouth, Throat: MMM, Normal external inspection ears/nares/mouth/lips/gums. TM normal bilaterally. Pharynx/tonsils no erythema, no exudate. Nasal mucosa normal.   Neck: No masses, trachea midline. No  tenderness/mass appreciated. No lymphadenopathy  Respiratory: Normal respiratory effort. +diffuse coarse breath sounds w/ wheeze, no rhonchi, no rales  Cardiovascular: S1/S2 normal, no murmur, no rub/gallop auscultated. RRR. No lower extremity edema.   Gastrointestinal: Nontender, no masses. Bowel sounds normal.  Musculoskeletal: Gait normal.   Neurological: Normal balance/coordination. No tremor.   Skin: warm, dry, intact.   Psychiatric: Normal judgment/insight. Normal mood and affect.   No results found for this or any previous visit (from the past 72 hour(s)).  CXR  personally reviewed w/ the patient, all questions answered.   Dg Chest 2 View  Result Date: 12/19/2017 CLINICAL DATA:  Cough. EXAM: CHEST - 2 VIEW COMPARISON:  Radiographs of January 21, 2017. FINDINGS: The heart size and mediastinal contours are within normal limits. No pneumothorax or pleural effusion is noted. Left lung is clear. Right upper lobe airspace opacity is noted consistent with pneumonia. The visualized skeletal structures are unremarkable. IMPRESSION: Right upper lobe airspace opacity consistent with pneumonia. Followup PA and lateral chest X-ray is recommended in 3-4 weeks following trial of antibiotic therapy to ensure resolution and exclude underlying malignancy. Electronically Signed   By: Lupita Raider, M.D.   On: 12/19/2017 09:34     ASSESSMENT/PLAN: The primary encounter diagnosis was Community acquired pneumonia of right upper lobe of lung (HCC). Diagnoses of Cough and Abnormal lung sounds were also pertinent to this visit.   Persistent symptoms w/ abn chest auscultation, will repeat steroids, breathing tx in office, Rx antibiotics and inhaler, as well as repeat steroid burst.   Advised establish with PCP in case persistent symptoms or concern for lung disease.   Meds ordered this encounter  Medications  . DISCONTD: ipratropium-albuterol (DUONEB) 0.5-2.5 (3) MG/3ML nebulizer solution 3 mL  . doxycycline (VIBRA-TABS) 100 MG tablet    Sig: Take 1 tablet (100 mg total) by mouth 2 (two) times daily.    Dispense:  14 tablet    Refill:  0  . albuterol (PROVENTIL HFA;VENTOLIN HFA) 108 (90 Base) MCG/ACT inhaler    Sig: Inhale 2 puffs into the lungs every 6 (six) hours as needed for wheezing.    Dispense:  1 Inhaler    Refill:  1  . albuterol (PROVENTIL) (2.5 MG/3ML) 0.083% nebulizer solution 2.5 mg  . ipratropium (ATROVENT) nebulizer solution 0.5 mg  . benzonatate (TESSALON) 200 MG capsule    Sig: Take 1 capsule (200 mg total) by mouth 3 (three) times daily as needed  for cough.    Dispense:  30 capsule    Refill:  0      Patient Instructions   Community-Acquired Pneumonia, Adult Pneumonia is an infection of the lungs. There are different types of pneumonia. One type can develop while a person is in a hospital. A different type, called community-acquired pneumonia, develops in people who are not, or have not recently been, in the hospital or other health care facility. What are the causes? Pneumonia may be caused by bacteria, viruses, or funguses. Community-acquired pneumonia is often caused by Streptococcus pneumonia bacteria. These bacteria are often passed from one person to another by breathing in droplets from the cough or sneeze of an infected person. What increases the risk? The condition is more likely to develop in:  People who havechronic diseases, such as chronic obstructive pulmonary disease (COPD), asthma, congestive heart failure, cystic fibrosis, diabetes, or kidney disease.  People who haveearly-stage or late-stage HIV.  People who havesickle cell disease.  People who havehad their spleen removed (splenectomy).  People  who havepoor dental hygiene.  People who havemedical conditions that increase the risk of breathing in (aspirating) secretions their own mouth and nose.  People who havea weakened immune system (immunocompromised).  People who smoke.  People whotravel to areas where pneumonia-causing germs commonly exist.  People whoare around animal habitats or animals that have pneumonia-causing germs, including birds, bats, rabbits, cats, and farm animals.  What are the signs or symptoms? Symptoms of this condition include:  Adry cough.  A wet (productive) cough.  Fever.  Sweating.  Chest pain, especially when breathing deeply or coughing.  Rapid breathing or difficulty breathing.  Shortness of breath.  Shaking chills.  Fatigue.  Muscle aches.  How is this diagnosed? Your health care provider  will take a medical history and perform a physical exam. You may also have other tests, including:  Imaging studies of your chest, including X-rays.  Tests to check your blood oxygen level and other blood gases.  Other tests on blood, mucus (sputum), fluid around your lungs (pleural fluid), and urine.  If your pneumonia is severe, other tests may be done to identify the specific cause of your illness. How is this treated? The type of treatment that you receive depends on many factors, such as the cause of your pneumonia, the medicines you take, and other medical conditions that you have. For most adults, treatment and recovery from pneumonia may occur at home. In some cases, treatment must happen in a hospital. Treatment may include:  Antibiotic medicines, if the pneumonia was caused by bacteria.  Antiviral medicines, if the pneumonia was caused by a virus.  Medicines that are given by mouth or through an IV tube.  Oxygen.  Respiratory therapy.  Although rare, treating severe pneumonia may include:  Mechanical ventilation. This is done if you are not breathing well on your own and you cannot maintain a safe blood oxygen level.  Thoracentesis. This procedureremoves fluid around one lung or both lungs to help you breathe better.  Follow these instructions at home:  Take over-the-counter and prescription medicines only as told by your health care provider. ? Only takecough medicine if you are losing sleep. Understand that cough medicine can prevent your body's natural ability to remove mucus from your lungs. ? If you were prescribed an antibiotic medicine, take it as told by your health care provider. Do not stop taking the antibiotic even if you start to feel better.  Sleep in a semi-upright position at night. Try sleeping in a reclining chair, or place a few pillows under your head.  Do not use tobacco products, including cigarettes, chewing tobacco, and e-cigarettes. If you need  help quitting, ask your health care provider.  Drink enough water to keep your urine clear or pale yellow. This will help to thin out mucus secretions in your lungs. How is this prevented? There are ways that you can decrease your risk of developing community-acquired pneumonia. Consider getting a pneumococcal vaccine if:  You are older than 56 years of age.  You are older than 56 years of age and are undergoing cancer treatment, have chronic lung disease, or have other medical conditions that affect your immune system. Ask your health care provider if this applies to you.  There are different types and schedules of pneumococcal vaccines. Ask your health care provider which vaccination option is best for you. You may also prevent community-acquired pneumonia if you take these actions:  Get an influenza vaccine every year. Ask your health care provider which type  of influenza vaccine is best for you.  Go to the dentist on a regular basis.  Wash your hands often. Use hand sanitizer if soap and water are not available.  Contact a health care provider if:  You have a fever.  You are losing sleep because you cannot control your cough with cough medicine. Get help right away if:  You have worsening shortness of breath.  You have increased chest pain.  Your sickness becomes worse, especially if you are an older adult or have a weakened immune system.  You cough up blood. This information is not intended to replace advice given to you by your health care provider. Make sure you discuss any questions you have with your health care provider. Document Released: 04/14/2005 Document Revised: 08/23/2015 Document Reviewed: 08/09/2014 Elsevier Interactive Patient Education  [employer].     If you have lab work done today you will be contacted with your lab results within the next 2 weeks.  If you have not heard from [place] then please contact us. The fastest way to get your results is to  register for My Chart.   IF you received an x-ray today, you will receive an invoice from [facility]. Please contact [facility] at [phone] with questions or concerns regarding your invoice.   IF you received labwork today, you will receive an invoice from [facility]. Please contact [facility] at [phone] with questions or concerns regarding your invoice.   Our billing staff will not be able to assist you with questions regarding bills from these companies.  You will be contacted with the lab results as soon as they are available. The fastest way to get your results is to activate your My Chart account. Instructions are located on the last page of this paperwork. If you have not heard from [place] regarding the results in 2 weeks, please contact this office.         Visit summary with medication list and pertinent instructions was printed for patient to review. All questions at time of visit were answered - patient instructed to contact office with any additional concerns. ER/RTC precautions were reviewed with the patient.   Follow-up plan: Return in about 3 weeks (around 01/09/2018) for follow-up pneumonia, repeat chest xray .   Please note: voice recognition software was used to produce this document, and typos may escape review. Please contact Dr. Lyn Hollingshead for any needed clarifications.

## 2017-12-19 NOTE — Patient Instructions (Addendum)
Community-Acquired Pneumonia, Adult Pneumonia is an infection of the lungs. There are different types of pneumonia. One type can develop while a person is in a hospital. A different type, called community-acquired pneumonia, develops in people who are not, or have not recently been, in the hospital or other health care facility. What are the causes? Pneumonia may be caused by bacteria, viruses, or funguses. Community-acquired pneumonia is often caused by Streptococcus pneumonia bacteria. These bacteria are often passed from one person to another by breathing in droplets from the cough or sneeze of an infected person. What increases the risk? The condition is more likely to develop in:  People who havechronic diseases, such as chronic obstructive pulmonary disease (COPD), asthma, congestive heart failure, cystic fibrosis, diabetes, or kidney disease.  People who haveearly-stage or late-stage HIV.  People who havesickle cell disease.  People who havehad their spleen removed (splenectomy).  People who havepoor dental hygiene.  People who havemedical conditions that increase the risk of breathing in (aspirating) secretions their own mouth and nose.  People who havea weakened immune system (immunocompromised).  People who smoke.  People whotravel to areas where pneumonia-causing germs commonly exist.  People whoare around animal habitats or animals that have pneumonia-causing germs, including birds, bats, rabbits, cats, and farm animals.  What are the signs or symptoms? Symptoms of this condition include:  Adry cough.  A wet (productive) cough.  Fever.  Sweating.  Chest pain, especially when breathing deeply or coughing.  Rapid breathing or difficulty breathing.  Shortness of breath.  Shaking chills.  Fatigue.  Muscle aches.  How is this diagnosed? Your health care provider will take a medical history and perform a physical exam. You may also have other tests,  including:  Imaging studies of your chest, including X-rays.  Tests to check your blood oxygen level and other blood gases.  Other tests on blood, mucus (sputum), fluid around your lungs (pleural fluid), and urine.  If your pneumonia is severe, other tests may be done to identify the specific cause of your illness. How is this treated? The type of treatment that you receive depends on many factors, such as the cause of your pneumonia, the medicines you take, and other medical conditions that you have. For most adults, treatment and recovery from pneumonia may occur at home. In some cases, treatment must happen in a hospital. Treatment may include:  Antibiotic medicines, if the pneumonia was caused by bacteria.  Antiviral medicines, if the pneumonia was caused by a virus.  Medicines that are given by mouth or through an IV tube.  Oxygen.  Respiratory therapy.  Although rare, treating severe pneumonia may include:  Mechanical ventilation. This is done if you are not breathing well on your own and you cannot maintain a safe blood oxygen level.  Thoracentesis. This procedureremoves fluid around one lung or both lungs to help you breathe better.  Follow these instructions at home:  Take over-the-counter and prescription medicines only as told by your health care provider. ? Only takecough medicine if you are losing sleep. Understand that cough medicine can prevent your body's natural ability to remove mucus from your lungs. ? If you were prescribed an antibiotic medicine, take it as told by your health care provider. Do not stop taking the antibiotic even if you start to feel better.  Sleep in a semi-upright position at night. Try sleeping in a reclining chair, or place a few pillows under your head.  Do not use tobacco products, including cigarettes, chewing   tobacco, and e-cigarettes. If you need help quitting, ask your health care provider.  Drink enough water to keep your urine  clear or pale yellow. This will help to thin out mucus secretions in your lungs. How is this prevented? There are ways that you can decrease your risk of developing community-acquired pneumonia. Consider getting a pneumococcal vaccine if:  You are older than 56 years of age.  You are older than 56 years of age and are undergoing cancer treatment, have chronic lung disease, or have other medical conditions that affect your immune system. Ask your health care provider if this applies to you.  There are different types and schedules of pneumococcal vaccines. Ask your health care provider which vaccination option is best for you. You may also prevent community-acquired pneumonia if you take these actions:  Get an influenza vaccine every year. Ask your health care provider which type of influenza vaccine is best for you.  Go to the dentist on a regular basis.  Wash your hands often. Use hand sanitizer if soap and water are not available.  Contact a health care provider if:  You have a fever.  You are losing sleep because you cannot control your cough with cough medicine. Get help right away if:  You have worsening shortness of breath.  You have increased chest pain.  Your sickness becomes worse, especially if you are an older adult or have a weakened immune system.  You cough up blood. This information is not intended to replace advice given to you by your health care provider. Make sure you discuss any questions you have with your health care provider. Document Released: 04/14/2005 Document Revised: 08/23/2015 Document Reviewed: 08/09/2014 Elsevier Interactive Patient Education  [employer]2018 [employer]     If you have lab work done today you will be contacted with your lab results within the next 2 weeks.  If you have not heard from us then please contact us. The fastest way to get your results is to register for My Chart.   IF you received an x-ray today, you will receive an invoice  [facility]. Please contact [facility] at [phone] with questions or concerns regarding your invoice.   IF you received labwork today, you will receive an invoice from [facility]. Please contact [facility] at [phone] with questions or concerns regarding your invoice.   Our billing staff will not be able to assist you with questions regarding bills from these companies.  You will be contacted with the lab results as soon as they are available. The fastest way to get your results is to activate your My Chart account. Instructions are located on the last page of this paperwork. If you have not heard from us regarding the results in 2 weeks, please contact this office.

## 2017-12-26 ENCOUNTER — Telehealth: Payer: Self-pay | Admitting: General Practice

## 2017-12-26 NOTE — Telephone Encounter (Signed)
PT. Calling with regards to visit on 12/19/17 with DO Alexander. Pt reports her pneumonia has not gotten better with the prescribed treatment and asserts she was told to call the practice in the event it did not worl.

## 2017-12-29 NOTE — Telephone Encounter (Signed)
Please Advise

## 2017-12-29 NOTE — Telephone Encounter (Signed)
Pt called today to state the pneumonia is still lingering and doesn't know if another round of abx is needed or if an appt is better; contact to advise

## 2017-12-30 NOTE — Telephone Encounter (Signed)
Needs appointment for reevaluation / repeat Chest XRay

## 2017-12-30 NOTE — Telephone Encounter (Signed)
MyChart message sent to pt

## 2018-01-02 ENCOUNTER — Other Ambulatory Visit: Payer: Self-pay

## 2018-01-02 ENCOUNTER — Ambulatory Visit (INDEPENDENT_AMBULATORY_CARE_PROVIDER_SITE_OTHER): Payer: BC Managed Care – PPO

## 2018-01-02 ENCOUNTER — Ambulatory Visit: Payer: BC Managed Care – PPO | Admitting: Urgent Care

## 2018-01-02 ENCOUNTER — Encounter: Payer: Self-pay | Admitting: Urgent Care

## 2018-01-02 VITALS — BP 129/82 | HR 110 | Temp 98.7°F | Resp 16 | Ht 64.0 in | Wt 185.0 lb

## 2018-01-02 DIAGNOSIS — J189 Pneumonia, unspecified organism: Secondary | ICD-10-CM

## 2018-01-02 DIAGNOSIS — J181 Lobar pneumonia, unspecified organism: Secondary | ICD-10-CM

## 2018-01-02 DIAGNOSIS — R5381 Other malaise: Secondary | ICD-10-CM

## 2018-01-02 DIAGNOSIS — R05 Cough: Secondary | ICD-10-CM | POA: Diagnosis not present

## 2018-01-02 DIAGNOSIS — R059 Cough, unspecified: Secondary | ICD-10-CM

## 2018-01-02 DIAGNOSIS — R000 Tachycardia, unspecified: Secondary | ICD-10-CM | POA: Diagnosis not present

## 2018-01-02 LAB — POCT CBC
Granulocyte percent: 47.3 %G (ref 37–80)
HCT, POC: 43.5 % (ref 37.7–47.9)
HEMOGLOBIN: 14 g/dL (ref 12.2–16.2)
LYMPH, POC: 3.1 (ref 0.6–3.4)
MCH, POC: 29.4 pg (ref 27–31.2)
MCHC: 32.2 g/dL (ref 31.8–35.4)
MCV: 91.2 fL (ref 80–97)
MID (cbc): 0.4 (ref 0–0.9)
MPV: 6.6 fL (ref 0–99.8)
PLATELET COUNT, POC: 347 10*3/uL (ref 142–424)
POC GRANULOCYTE: 3.2 (ref 2–6.9)
POC LYMPH PERCENT: 46.4 %L (ref 10–50)
POC MID %: 6.3 %M (ref 0–12)
RBC: 4.77 M/uL (ref 4.04–5.48)
RDW, POC: 14.3 %
WBC: 6.7 10*3/uL (ref 4.6–10.2)

## 2018-01-02 MED ORDER — METHYLPREDNISOLONE ACETATE 80 MG/ML IJ SUSP
80.0000 mg | Freq: Once | INTRAMUSCULAR | Status: AC
  Administered 2018-01-02: 80 mg via INTRAMUSCULAR

## 2018-01-02 MED ORDER — HYDROCODONE-HOMATROPINE 5-1.5 MG/5ML PO SYRP: 5.0000 mL | ORAL_SOLUTION | Freq: Every evening | ORAL | 0 refills | Status: DC | PRN

## 2018-01-02 NOTE — Progress Notes (Signed)
MRN: [id number] DOB: 1961-03-17  Subjective:   Sandra Mills is a 57 y.o. female presenting for follow up on immunity acquired pneumonia.  Last office visit here was 12/19/2017, patient was started on doxycycline for pneumonia seen on chest x-ray, referred to imaging result from 12/19/2017 for further detail.  She was also given albuterol, Tessalon for supportive care given her shortness of breath and wheezing.  Prior to that she had undergone a course of prednisone for diagnosis of bronchitis.  Today, she reports that she feels worse.  Has a persistent cough, wheezing, shortness of breath, mid to left-sided chest pain, malaise and fatigue.  She has been using albuterol inhaler 3-4 times per day.  Reports that Tessalon did not help her.  She finished doxycycline last week, started having some loose stools today.  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  She is also very involved in church and has not rested from her church activities either.  reports that she has never smoked. She has never used smokeless tobacco. She reports that she does not drink alcohol or use drugs.   Sandra Mills has a current medication list which includes the following prescription(s): albuterol, azelastine, benzonatate, doxycycline, gabapentin, and sertraline. Also has No Known Allergies.  Sandra Mills  has a past medical history of Anxiety, Colon polyps (12/09/12), Depression, IBS (irritable bowel syndrome), Migraine, and Tremor, essential. Also  has a past surgical history that includes Cyst resection perineal and Colonoscopy w/ polypectomy (12/09/12).  Objective:   Vitals: BP 129/82   Pulse (!) 110   Temp 98.7 F (37.1 C) (Oral)   Resp 16   Ht 5\' 4"  (1.626 m)   Wt 185 lb (83.9 kg)   SpO2 94%   BMI 31.76 kg/m   Physical Exam  Constitutional: She is oriented to person, place, and time. She appears well-developed and well-nourished.  Appears  lethargic.  Cardiovascular: Regular rhythm and intact distal pulses. Exam reveals no gallop and no friction rub.  No murmur heard. Tachycardic at 110 bpm.  Pulmonary/Chest: Effort normal. No respiratory distress. She has wheezes (diffuse throughout). She has no rales.  Neurological: She is alert and oriented to person, place, and time.   Results for orders placed or performed in visit on 01/02/18 (from the past 24 hour(s))  POCT CBC     Status: None   Collection Time: 01/02/18 12:43 PM  Result Value Ref Range   WBC 6.7 4.6 - 10.2 K/uL   Lymph, poc 3.1 0.6 - 3.4   POC LYMPH PERCENT 46.4 10 - 50 %L   MID (cbc) 0.4 0 - 0.9   POC MID % 6.3 0 - 12 %M   POC Granulocyte 3.2 2 - 6.9   Granulocyte percent 47.3 37 - 80 %G   RBC 4.77 4.04 - 5.48 M/uL   Hemoglobin 14.0 12.2 - 16.2 g/dL   HCT, POC 40.9 81.1 - 47.9 %   MCV 91.2 80 - 97 fL   MCH, POC 29.4 27 - 31.2 pg   MCHC 32.2 31.8 - 35.4 g/dL   RDW, POC 91.4 %   Platelet Count, POC 347 142 - 424 K/uL   MPV 6.6 0 - 99.8 fL   Dg Chest 2 View  Result Date: 01/02/2018 CLINICAL DATA:  Right upper lobe community acquired pneumonia. EXAM: CHEST - 2 VIEW COMPARISON:  12/19/2017 FINDINGS: The heart size and mediastinal contours are within normal limits.  Previously seen airspace disease in the anterior right upper lobe has resolved since previous study. There is minimal linear opacity at this site, consistent with mild scarring or atelectasis. No other areas of pulmonary opacity are seen. No evidence of pleural effusion. IMPRESSION: Resolution of right upper lobe airspace disease, with minimal residual scarring or atelectasis. Electronically Signed   By: Myles Rosenthal M.D.   On: 01/02/2018 12:44   Assessment and Plan :   Community acquired pneumonia of right upper lobe of lung (HCC) - Plan: DG Chest 2 View, POCT CBC, methylPREDNISolone acetate (DEPO-MEDROL) injection 80 mg  Cough - Plan: methylPREDNISolone acetate (DEPO-MEDROL) injection 80  mg  Malaise  Tachycardia  Patient's chest x-ray shows improvement and CBC is reassuring.  Counseled on need for rest.  Will use IM Depo-Medrol and Hycodan to address her symptoms today.  She is to use probiotic to address stomach upset with antibiotic use. Counseled patient on potential for adverse effects with medications prescribed today, patient verbalized understanding.  ER and return to clinic precautions reviewed.  Wallis Bamberg, PA-C [facility] [phone] 01/02/2018 12:21 PM

## 2018-01-02 NOTE — Patient Instructions (Addendum)
For sore throat try using a honey-based tea. Use 3 teaspoons of honey with juice squeezed from half lemon. Place shaved pieces of ginger into 1/2-1 cup of water and warm over stove top. Then mix the ingredients and repeat every 4 hours as needed. Hydrate well with at least 2 liters (64 ounces) of water daily. Eat light meals including soups, salads, fruits.    Community-Acquired Pneumonia, Adult Pneumonia is an infection of the lungs. There are different types of pneumonia. One type can develop while a person is in a hospital. A different type, called community-acquired pneumonia, develops in people who are not, or have not recently been, in the hospital or other health care facility. What are the causes? Pneumonia may be caused by bacteria, viruses, or funguses. Community-acquired pneumonia is often caused by Streptococcus pneumonia bacteria. These bacteria are often passed from one person to another by breathing in droplets from the cough or sneeze of an infected person. What increases the risk? The condition is more likely to develop in:  People who havechronic diseases, such as chronic obstructive pulmonary disease (COPD), asthma, congestive heart failure, cystic fibrosis, diabetes, or kidney disease.  People who haveearly-stage or late-stage HIV.  People who havesickle cell disease.  People who havehad their spleen removed (splenectomy).  People who havepoor Administrator.  People who havemedical conditions that increase the risk of breathing in (aspirating) secretions their own mouth and nose.  People who havea weakened immune system (immunocompromised).  People who smoke.  People whotravel to areas where pneumonia-causing germs commonly exist.  People whoare around animal habitats or animals that have pneumonia-causing germs, including birds, bats, rabbits, cats, and farm animals.  What are the signs or symptoms? Symptoms of this condition include:  Adry cough.  A  wet (productive) cough.  Fever.  Sweating.  Chest pain, especially when breathing deeply or coughing.  Rapid breathing or difficulty breathing.  Shortness of breath.  Shaking chills.  Fatigue.  Muscle aches.  How is this diagnosed? Your health care provider will take a medical history and perform a physical exam. You may also have other tests, including:  Imaging studies of your chest, including X-rays.  Tests to check your blood oxygen level and other blood gases.  Other tests on blood, mucus (sputum), fluid around your lungs (pleural fluid), and urine.  If your pneumonia is severe, other tests may be done to identify the specific cause of your illness. How is this treated? The type of treatment that you receive depends on many factors, such as the cause of your pneumonia, the medicines you take, and other medical conditions that you have. For most adults, treatment and recovery from pneumonia may occur at home. In some cases, treatment must happen in a hospital. Treatment may include:  Antibiotic medicines, if the pneumonia was caused by bacteria.  Antiviral medicines, if the pneumonia was caused by a virus.  Medicines that are given by mouth or through an IV tube.  Oxygen.  Respiratory therapy.  Although rare, treating severe pneumonia may include:  Mechanical ventilation. This is done if you are not breathing well on your own and you cannot maintain a safe blood oxygen level.  Thoracentesis. This procedureremoves fluid around one lung or both lungs to help you breathe better.  Follow these instructions at home:  Take over-the-counter and prescription medicines only as told by your health care provider. ? Only takecough medicine if you are losing sleep. Understand that cough medicine can prevent your body's natural ability  to remove mucus from your lungs. ? If you were prescribed an antibiotic medicine, take it as told by your health care provider. Do not stop  taking the antibiotic even if you start to feel better.  Sleep in a semi-upright position at night. Try sleeping in a reclining chair, or place a few pillows under your head.  Do not use tobacco products, including cigarettes, chewing tobacco, and e-cigarettes. If you need help quitting, ask your health care provider.  Drink enough water to keep your urine clear or pale yellow. This will help to thin out mucus secretions in your lungs. How is this prevented? There are ways that you can decrease your risk of developing community-acquired pneumonia. Consider getting a pneumococcal vaccine if:  You are older than 56 years of age.  You are older than 56 years of age and are undergoing cancer treatment, have chronic lung disease, or have other medical conditions that affect your immune system. Ask your health care provider if this applies to you.  There are different types and schedules of pneumococcal vaccines. Ask your health care provider which vaccination option is best for you. You may also prevent community-acquired pneumonia if you take these actions:  Get an influenza vaccine every year. Ask your health care provider which type of influenza vaccine is best for you.  Go to the dentist on a regular basis.  Wash your hands often. Use hand sanitizer if soap and water are not available.  Contact a health care provider if:  You have a fever.  You are losing sleep because you cannot control your cough with cough medicine. Get help right away if:  You have worsening shortness of breath.  You have increased chest pain.  Your sickness becomes worse, especially if you are an older adult or have a weakened immune system.  You cough up blood. This information is not intended to replace advice given to you by your health care provider. Make sure you discuss any questions you have with your health care provider. Document Released: 04/14/2005 Document Revised: 08/23/2015 Document Reviewed:  08/09/2014 Elsevier Interactive Patient Education  [employer].    If you have lab work done today you will be contacted with your lab results within the next 2 weeks.  If you have not heard from [place] then please contact us. The fastest way to get your results is to register for My Chart.   IF you received an x-ray today, you will receive an invoice from [facility]. Please contact [facility] at [phone] with questions or concerns regarding your invoice.   IF you received labwork today, you will receive an invoice from [facility]. Please contact [facility] at [phone] with questions or concerns regarding your invoice.   Our billing staff will not be able to assist you with questions regarding bills from these companies.  You will be contacted with the lab results as soon as they are available. The fastest way to get your results is to activate your My Chart account. Instructions are located on the last page of this paperwork. If you have not heard from [place] regarding the results in 2 weeks, please contact this office.

## 2018-01-08 ENCOUNTER — Ambulatory Visit: Payer: BC Managed Care – PPO | Admitting: Emergency Medicine

## 2018-01-08 ENCOUNTER — Encounter: Payer: Self-pay | Admitting: Emergency Medicine

## 2018-01-08 ENCOUNTER — Other Ambulatory Visit: Payer: Self-pay

## 2018-01-08 VITALS — BP 110/73 | HR 91 | Temp 99.4°F | Resp 16 | Wt 178.8 lb

## 2018-01-08 DIAGNOSIS — J181 Lobar pneumonia, unspecified organism: Secondary | ICD-10-CM

## 2018-01-08 DIAGNOSIS — J069 Acute upper respiratory infection, unspecified: Secondary | ICD-10-CM | POA: Diagnosis not present

## 2018-01-08 DIAGNOSIS — R05 Cough: Secondary | ICD-10-CM | POA: Diagnosis not present

## 2018-01-08 DIAGNOSIS — R059 Cough, unspecified: Secondary | ICD-10-CM

## 2018-01-08 DIAGNOSIS — J189 Pneumonia, unspecified organism: Secondary | ICD-10-CM

## 2018-01-08 NOTE — Progress Notes (Signed)
Sandra Mills 57 y.o.   Chief Complaint  Patient presents with  . Pneumonia    follow up    HISTORY OF PRESENT ILLNESS: This is a 57 y.o. female has been sick since the beginning of August with flulike symptoms.  At some point she developed pneumonia.  Treated with doxycycline.  Chest x-ray done on 12/19/2017 showed right upper lobe pneumonia.  Repeat x-ray on 01/02/2018 showed significant improvement.  Patient states that she is about 75% better.  Still has some residual cough and chest congestion.  No other significant symptoms.  Overall doing better.  HPI   Prior to Admission medications   Medication Sig Start Date End Date Taking? Authorizing Provider  albuterol (PROVENTIL HFA;VENTOLIN HFA) 108 (90 Base) MCG/ACT inhaler Inhale 2 puffs into the lungs every 6 (six) hours as needed for wheezing. 12/19/17  Yes Sunnie NielsenAlexander, Natalie, DO  azelastine (ASTELIN) 0.1 % nasal spray PLACE 2 SPRAYS INTO BOTH NOSTRILS 2 (TWO) TIMES DAILY. USE IN EACH NOSTRIL AS DIRECTED 08/12/17  Yes Jeffery, Chelle, PA  gabapentin (NEURONTIN) 100 MG capsule TAKE 2 CAPSULES BY MOUTH 2 TIMES DAILY 10/09/17  Yes Eksir, Bo McclintockAlexander Arya, MD  HYDROcodone-homatropine [facility](HYCODAN) 5-1.5 MG/5ML syrup Take 5 mLs by mouth at bedtime as needed. 01/02/18  Yes Wallis BambergMani, Mario, PA-C  sertraline (ZOLOFT) 100 MG tablet Take 1 tablet (100 mg total) by mouth daily. For depression 10/09/17  Yes Eksir, Bo McclintockAlexander Arya, MD  benzonatate (TESSALON) 200 MG capsule Take 1 capsule (200 mg total) by mouth 3 (three) times daily as needed for cough. Patient not taking: Reported on 01/08/2018 12/19/17   Sunnie NielsenAlexander, Natalie, DO    No Known Allergies  Patient Active Problem List   Diagnosis Date Noted  . Generalized anxiety disorder 02/27/2017  . Recurrent major depressive disorder, in full remission (HCC) 12/03/2016  . Migraine, menstrual 06/23/2007    Past Medical History:  Diagnosis Date  . Anxiety   . Colon polyps 12/09/12   three polyps; [place] [place];  repeat in 3-5 years.  . Depression   . IBS (irritable bowel syndrome)    diarrhea predominant; s/p GI consult with colonoscopy  . Migraine    menstrually related.  . Tremor, essential     Past Surgical History:  Procedure Laterality Date  . COLONOSCOPY W/ POLYPECTOMY  12/09/12   three polyps; [place]. Repeat in 3-5 years.  . Cyst resection perineal      Social History   Socioeconomic History  . Marital status: Legally Separated    Spouse name: 11/2015  . Number of children: 4  . Years of education: Master's  . Highest education level: Not on file  Occupational History  . Occupation: Magazine features editorTEACHER    Employer: [employer]    Comment: Music (choir/voice)  Social Needs  . Financial resource strain: Not on file  . Food insecurity:    Worry: Not on file    Inability: Not on file  . Transportation needs:    Medical: Not on file    Non-medical: Not on file  Tobacco Use  . Smoking status: Never Smoker  . Smokeless tobacco: Never Used  Substance and Sexual Activity  . Alcohol use: No  . Drug use: No  . Sexual activity: Yes    Birth control/protection: Surgical    Comment: Husband vasectomy  Lifestyle  . Physical activity:    Days per week: Not on file    Minutes per session: Not on file  . Stress: Not on file  Relationships  .  Social connections:    Talks on phone: Not on file    Gets together: Not on file    Attends religious service: Not on file    Active member of club or organization: Not on file    Attends meetings of clubs or organizations: Not on file    Relationship status: Not on file  . Intimate partner violence:    Fear of current or ex partner: Not on file    Emotionally abused: Not on file    Physically abused: Not on file    Forced sexual activity: Not on file  Other Topics Concern  . Not on file  Social History Narrative   Marital status:  Married x 31 years, no abuse. Separated 11/2015. Dating.      Lives: Lives alone, children (1993, 1995, 1997, 1999)  live with their father and visit her regulalry.  No grandchildren.      Children: 4 children; no grandchildren.      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  Previously worked [employer].   No tobacco/alcohol/drugs; wine three times per year.   Exercise sporadic; curves and [employer].   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      Family History  Problem Relation Age of Onset  . Breast cancer Mother 52  . Cancer Mother 30       Breast cancer age 2  . Atrial fibrillation Father   . Atrial fibrillation Sister   . Breast cancer Other        maternal great aunt dx in her 26s  . Breast cancer Other        6 maternal great aunts dx in their 21s     Review of Systems  Constitutional: Negative.  Negative for chills and fever.  HENT: Positive for congestion. Negative for sore throat.   Eyes: Negative.  Negative for blurred vision and double vision.  Respiratory: Positive for cough. Negative for sputum production, shortness of breath and wheezing.   Cardiovascular: Negative.  Negative for chest pain and palpitations.  Gastrointestinal: Negative.  Negative for abdominal pain, diarrhea, nausea and vomiting.  Genitourinary: Negative.  Negative for dysuria and hematuria.  Musculoskeletal: Negative.  Negative for back pain, myalgias and neck pain.  Skin: Negative.  Negative for rash.  Neurological: Negative.  Negative for dizziness.  Endo/Heme/Allergies: Negative.   All other systems reviewed and are negative.   Vitals:   01/08/18 1344  BP: 110/73  Pulse: 91  Resp: 16  Temp: 99.4 F (37.4 C)  SpO2: 95%    Physical Exam  Constitutional: She is oriented to person, place, and time. She appears well-developed and well-nourished.  HENT:  Head: Normocephalic and atraumatic.  Nose: Nose normal.  Mouth/Throat: Oropharynx is clear and moist.  Eyes: Pupils are equal, round, and reactive to light. Conjunctivae and EOM are normal.  Neck: Normal range of motion. Neck supple. No JVD  present.  Cardiovascular: Normal rate, regular rhythm and normal heart sounds.  Pulmonary/Chest: Effort normal and breath sounds normal.  Musculoskeletal: Normal range of motion.  Lymphadenopathy:    She has no cervical adenopathy.  Neurological: She is alert and oriented to person, place, and time. No sensory deficit. She exhibits normal muscle tone.  Skin: Skin is warm and dry. Capillary refill takes less than 2 seconds.  Psychiatric: She has a normal mood and affect. Her behavior is normal.  Vitals reviewed.    ASSESSMENT & PLAN: Domingue was seen today for pneumonia.  Diagnoses and all orders for this visit:  Acute upper respiratory infection Comments: Still lingering  Community acquired pneumonia of right upper lobe of lung (HCC) Comments: Improved  Cough Comments: Much improved    Patient Instructions       If you have lab work done today you will be contacted with your lab results within the next 2 weeks.  If you have not heard from [place] then please contact us. The fastest way to get your results is to register for My Chart.   IF you received an x-ray today, you will receive an invoice from [facility]. Please contact [facility] at [phone] with questions or concerns regarding your invoice.   IF you received labwork today, you will receive an invoice from [facility]. Please contact [facility] at [phone] with questions or concerns regarding your invoice.   Our billing staff will not be able to assist you with questions regarding bills from these companies.  You will be contacted with the lab results as soon as they are available. The fastest way to get your results is to activate your My Chart account. Instructions are located on the last page of this paperwork. If you have not heard from [place] regarding the results in 2 weeks, please contact this office.     Cough, Adult A cough helps to clear your throat and lungs. A cough may last only 2-3  weeks (acute), or it may last longer than 8 weeks (chronic). Many different things can cause a cough. A cough may be a sign of an illness or another medical condition. Follow these instructions at home:  Pay attention to any changes in your cough.  Take medicines only as told by your doctor. ? If you were prescribed an antibiotic medicine, take it as told by your doctor. Do not stop taking it even if you start to feel better. ? Talk with your doctor before you try using a cough medicine.  Drink enough fluid to keep your pee (urine) clear or pale yellow.  If the air is dry, use a cold steam vaporizer or humidifier in your home.  Stay away from things that make you cough at work or at home.  If your cough is worse at night, try using extra pillows to raise your head up higher while you sleep.  Do not smoke, and try not to be around smoke. If you need help quitting, ask your doctor.  Do not have caffeine.  Do not drink alcohol.  Rest as needed. Contact a doctor if:  You have new problems (symptoms).  You cough up yellow fluid (pus).  Your cough does not get better after 2-3 weeks, or your cough gets worse.  Medicine does not help your cough and you are not sleeping well.  You have pain that gets worse or pain that is not helped with medicine.  You have a fever.  You are losing weight and you do not know why.  You have night sweats. Get help right away if:  You cough up blood.  You have trouble breathing.  Your heartbeat is very fast. This information is not intended to replace advice given to you by your health care provider. Make sure you discuss any questions you have with your health care provider. Document Released: 12/26/2010 Document Revised: 09/20/2015 Document Reviewed: 06/21/2014 Elsevier Interactive Patient Education  2018 Elsevier Inc.      Edwina Barth, MD [facility]

## 2018-01-08 NOTE — Patient Instructions (Addendum)
     If you have lab work done today you will be contacted with your lab results within the next 2 weeks.  If you have not heard from us then please contact us. The fastest way to get your results is to register for My Chart.   IF you received an x-ray today, you will receive an invoice from [facility]. Please contact  Radiology at [phone] with questions or concerns regarding your invoice.   IF you received labwork today, you will receive an invoice from [facility]. Please contact [facility] at [phone] with questions or concerns regarding your invoice.   Our billing staff will not be able to assist you with questions regarding bills from these companies.  You will be contacted with the lab results as soon as they are available. The fastest way to get your results is to activate your My Chart account. Instructions are located on the last page of this paperwork. If you have not heard from us regarding the results in 2 weeks, please contact this office.     Cough, Adult A cough helps to clear your throat and lungs. A cough may last only 2-3 weeks (acute), or it may last longer than 8 weeks (chronic). Many different things can cause a cough. A cough may be a sign of an illness or another medical condition. Follow these instructions at home:  Pay attention to any changes in your cough.  Take medicines only as told by your doctor. ? If you were prescribed an antibiotic medicine, take it as told by your doctor. Do not stop taking it even if you start to feel better. ? Talk with your doctor before you try using a cough medicine.  Drink enough fluid to keep your pee (urine) clear or pale yellow.  If the air is dry, use a cold steam vaporizer or humidifier in your home.  Stay away from things that make you cough at work or at home.  If your cough is worse at night, try using extra pillows to raise your head up higher while you sleep.  Do not smoke, and try not to be  around smoke. If you need help quitting, ask your doctor.  Do not have caffeine.  Do not drink alcohol.  Rest as needed. Contact a doctor if:  You have new problems (symptoms).  You cough up yellow fluid (pus).  Your cough does not get better after 2-3 weeks, or your cough gets worse.  Medicine does not help your cough and you are not sleeping well.  You have pain that gets worse or pain that is not helped with medicine.  You have a fever.  You are losing weight and you do not know why.  You have night sweats. Get help right away if:  You cough up blood.  You have trouble breathing.  Your heartbeat is very fast. This information is not intended to replace advice given to you by your health care provider. Make sure you discuss any questions you have with your health care provider. Document Released: 12/26/2010 Document Revised: 09/20/2015 Document Reviewed: 06/21/2014 Elsevier Interactive Patient Education  2018 Elsevier Inc.  

## 2018-01-26 ENCOUNTER — Ambulatory Visit: Payer: BC Managed Care – PPO | Admitting: Family Medicine

## 2018-01-26 ENCOUNTER — Other Ambulatory Visit: Payer: Self-pay

## 2018-01-26 ENCOUNTER — Encounter: Payer: Self-pay | Admitting: Family Medicine

## 2018-01-26 ENCOUNTER — Ambulatory Visit (INDEPENDENT_AMBULATORY_CARE_PROVIDER_SITE_OTHER): Payer: BC Managed Care – PPO

## 2018-01-26 VITALS — BP 112/77 | HR 90 | Temp 98.2°F | Resp 17 | Ht 64.0 in | Wt 178.0 lb

## 2018-01-26 DIAGNOSIS — J181 Lobar pneumonia, unspecified organism: Secondary | ICD-10-CM | POA: Diagnosis not present

## 2018-01-26 DIAGNOSIS — J189 Pneumonia, unspecified organism: Secondary | ICD-10-CM

## 2018-01-26 DIAGNOSIS — R05 Cough: Secondary | ICD-10-CM | POA: Diagnosis not present

## 2018-01-26 DIAGNOSIS — R059 Cough, unspecified: Secondary | ICD-10-CM

## 2018-01-26 DIAGNOSIS — J302 Other seasonal allergic rhinitis: Secondary | ICD-10-CM | POA: Diagnosis not present

## 2018-01-26 LAB — POCT CBC
Granulocyte percent: 63 %G (ref 37–80)
HCT, POC: 39.4 % (ref 37.7–47.9)
Hemoglobin: 13.4 g/dL (ref 12.2–16.2)
Lymph, poc: 2.8 (ref 0.6–3.4)
MCH, POC: 30.2 pg (ref 27–31.2)
MCHC: 34.2 g/dL (ref 31.8–35.4)
MCV: 88.4 fL (ref 80–97)
MID (cbc): 0.3 (ref 0–0.9)
MPV: 6.5 fL (ref 0–99.8)
POC Granulocyte: 5.3 (ref 2–6.9)
POC LYMPH PERCENT: 33.7 %L (ref 10–50)
POC MID %: 3.3 %M (ref 0–12)
Platelet Count, POC: 346 10*3/uL (ref 142–424)
RBC: 4.45 M/uL (ref 4.04–5.48)
RDW, POC: 14.3 %
WBC: 8.4 10*3/uL (ref 4.6–10.2)

## 2018-01-26 MED ORDER — FLUTICASONE PROPIONATE 50 MCG/ACT NA SUSP: 1.0000 | Freq: Two times a day (BID) | NASAL | 6 refills | Status: DC

## 2018-01-26 MED ORDER — CETIRIZINE HCL 10 MG PO TABS: 10.0000 mg | ORAL_TABLET | Freq: Every day | ORAL | 11 refills | Status: DC

## 2018-01-26 NOTE — Patient Instructions (Addendum)
     If you have lab work done today you will be contacted with your lab results within the next 2 weeks.  If you have not heard from [place] then please contact us. The fastest way to get your results is to register for My Chart.   IF you received an x-ray today, you will receive an invoice from [facility]. Please contact [facility] at [phone] with questions or concerns regarding your invoice.   IF you received labwork today, you will receive an invoice from [facility]. Please contact [facility] at [phone] with questions or concerns regarding your invoice.   Our billing staff will not be able to assist you with questions regarding bills from these companies.  You will be contacted with the lab results as soon as they are available. The fastest way to get your results is to activate your My Chart account. Instructions are located on the last page of this paperwork. If you have not heard from [place] regarding the results in 2 weeks, please contact this office.      Nasal Allergies Nasal allergies are a reaction to allergens in the air. Allergens are tiny specks (particles) in the air that cause your body to have an allergic reaction. Nasal allergies are not passed from person to person (contagious). They cannot be cured, but they can be controlled. Common causes of nasal allergies include:  Pollen from grasses, trees, and weeds.  House dust mites.  Pet dander.  Mold.  Follow these instructions at home:  Avoid the allergen that is causing your symptoms, if you can.  Keep windows closed. If possible, use air conditioning when there is a lot of pollen in the air.  Do not use fans in your home.  Do not hang clothes outside to dry.  Wear sunglasses to keep pollen out of your eyes.  Wash your hands right away after you touch household pets.  Take over-the-counter and prescription medicines only as told by your doctor.  Keep all follow-up visits as told by your  doctor. This is important. Contact a doctor if:  You have a fever.  You have a cough that does not go away (is persistent).  You start to make whistling sounds when you breathe (wheeze).  Your symptoms do not get better with treatment.  You have thick fluid coming from your nose.  You start to have nosebleeds. Get help right away if:  Your tongue or your lips are swollen.  You have trouble breathing.  You feel light-headed or you feel like you are going to pass out (faint).  You have cold sweats. This information is not intended to replace advice given to you by your health care provider. Make sure you discuss any questions you have with your health care provider. Document Released: 08/14/2010 Document Revised: 09/20/2015 Document Reviewed: 10/25/2014 Elsevier Interactive Patient Education  [employer].

## 2018-01-26 NOTE — Progress Notes (Signed)
10/1/20193:19 PM  Sandra Mills [birth year], 56 y.o. female [id number]  Chief Complaint  Patient presents with  . Follow-up    pneumonia     HPI:   Patient is a 57 y.o. female with past medical history significant for Anxiety, Colon polyps (12/09/12), Depression, IBS (irritable bowel syndrome), Migraine, and Tremor, essential.who presents today for followup on PNA  Treated for PNA on 12/19/17 with doxy Seen on 9/7 and 9/13, prednisone did not help Repeat CXR on 9/7 showed improvement  Overall feeling better, but not really resolving Still coughing lots, productive, thick, whitish Having runny nose, sneezing Still feels rattling in her chest Reports SOB when she needs to walk long distance at work albuterol helps Reports subjective fevers No ear pain, sore throat Has been working   She is really worried about having something sinister after reading a note on my chart that stated r/o malignancy  Does have fall allergies Has been taking benadryl at night Takes zyrtec sporadically  Fall Risk  01/26/2018 01/26/2018 01/08/2018 01/02/2018 12/19/2017  Falls in the past year? No No No No No     Depression screen [facility] 2/9 01/26/2018 01/08/2018 01/02/2018  Decreased Interest 0 0 0  Down, Depressed, Hopeless 0 0 0  PHQ - 2 Score 0 0 0  Altered sleeping - - -  Tired, decreased energy - - -  Change in appetite - - -  Feeling bad or failure about yourself  - - -  Trouble concentrating - - -  Moving slowly or fidgety/restless - - -  Suicidal thoughts - - -  PHQ-9 Score - - -  Difficult doing work/chores - - -    No Known Allergies  Prior to Admission medications   Medication Sig Start Date End Date Taking? Authorizing Provider  albuterol (PROVENTIL HFA;VENTOLIN HFA) 108 (90 Base) MCG/ACT inhaler Inhale 2 puffs into the lungs every 6 (six) hours as needed for wheezing. 12/19/17  Yes Sunnie Nielsen, DO  azelastine (ASTELIN) 0.1 % nasal spray PLACE 2 SPRAYS INTO BOTH NOSTRILS 2  (TWO) TIMES DAILY. USE IN EACH NOSTRIL AS DIRECTED 08/12/17  Yes Jeffery, Chelle, PA  benzonatate (TESSALON) 200 MG capsule Take 1 capsule (200 mg total) by mouth 3 (three) times daily as needed for cough. 12/19/17  Yes Sunnie Nielsen, DO  gabapentin (NEURONTIN) 100 MG capsule TAKE 2 CAPSULES BY MOUTH 2 TIMES DAILY 10/09/17  Yes Eksir, Bo Mcclintock, MD  HYDROcodone-homatropine [facility]) 5-1.5 MG/5ML syrup Take 5 mLs by mouth at bedtime as needed. 01/02/18  Yes Wallis Bamberg, PA-C  sertraline (ZOLOFT) 100 MG tablet Take 1 tablet (100 mg total) by mouth daily. For depression 10/09/17  Yes Eksir, Bo Mcclintock, MD    Past Medical History:  Diagnosis Date  . Anxiety   . Colon polyps 12/09/12   three polyps; [facility]; repeat in 3-5 years.  . Depression   . IBS (irritable bowel syndrome)    diarrhea predominant; s/p GI consult with colonoscopy  . Migraine    menstrually related.  . Tremor, essential     Past Surgical History:  Procedure Laterality Date  . COLONOSCOPY W/ POLYPECTOMY  12/09/12   three polyps; [place]. Repeat in 3-5 years.  . Cyst resection perineal      Social History   Tobacco Use  . Smoking status: Never Smoker  . Smokeless tobacco: Never Used  Substance Use Topics  . Alcohol use: No    Family History  Problem Relation Age of Onset  . Breast cancer  Mother 61  . Cancer Mother 22       Breast cancer age 69  . Atrial fibrillation Father   . Atrial fibrillation Sister   . Breast cancer Other        maternal great aunt dx in her 73s  . Breast cancer Other        6 maternal great aunts dx in their 50s    ROS Per hpi  OBJECTIVE:  Blood pressure 112/77, pulse 90, temperature 98.2 F (36.8 C), temperature source Oral, resp. rate 17, height 5\' 4"  (1.626 m), weight 178 lb (80.7 kg), SpO2 98 %. Body mass index is 30.55 kg/m.   Physical Exam  Constitutional: She is oriented to person, place, and time. She appears well-developed and well-nourished.  HENT:  Head:  Normocephalic and atraumatic.  Right Ear: Hearing, tympanic membrane, external ear and ear canal normal.  Left Ear: Hearing, tympanic membrane, external ear and ear canal normal.  Nose: Right sinus exhibits no maxillary sinus tenderness and no frontal sinus tenderness. Left sinus exhibits no maxillary sinus tenderness and no frontal sinus tenderness.  Mouth/Throat: Oropharynx is clear and moist.  Eyes: Pupils are equal, round, and reactive to light. Conjunctivae and EOM are normal.  Neck: Neck supple.  Cardiovascular: Normal rate, regular rhythm and normal heart sounds. Exam reveals no gallop and no friction rub.  No murmur heard. Pulmonary/Chest: Effort normal and breath sounds normal. She has no wheezes. She has no rales.  Musculoskeletal: She exhibits no edema.  Lymphadenopathy:    She has no cervical adenopathy.  Neurological: She is alert and oriented to person, place, and time.  Skin: Skin is warm and dry.  Psychiatric: She has a normal mood and affect.  Nursing note and vitals reviewed.   Results for orders placed or performed in visit on 01/26/18 (from the past 24 hour(s))  POCT CBC     Status: None   Collection Time: 01/26/18  3:46 PM  Result Value Ref Range   WBC 8.4 4.6 - 10.2 K/uL   Lymph, poc 2.8 0.6 - 3.4   POC LYMPH PERCENT 33.7 10 - 50 %L   MID (cbc) 0.3 0 - 0.9   POC MID % 3.3 0 - 12 %M   POC Granulocyte 5.3 2 - 6.9   Granulocyte percent 63.0 37 - 80 %G   RBC 4.45 4.04 - 5.48 M/uL   Hemoglobin 13.4 12.2 - 16.2 g/dL   HCT, POC 16.1 09.6 - 47.9 %   MCV 88.4 80 - 97 fL   MCH, POC 30.2 27 - 31.2 pg   MCHC 34.2 31.8 - 35.4 g/dL   RDW, POC 04.5 %   Platelet Count, POC 346 142 - 424 K/uL   MPV 6.5 0 - 99.8 fL    Dg Chest 2 View  Result Date: 01/26/2018 CLINICAL DATA:  Productive cough recent pneumonia. EXAM: CHEST - 2 VIEW COMPARISON:  January 02, 2018 FINDINGS: The heart size and mediastinal contours are within normal limits. No focal infiltrate, pulmonary  edema, or pleural effusion is identified. The visualized skeletal structures are unremarkable. IMPRESSION: No active cardiopulmonary disease. Electronically Signed   By: Sherian Rein M.D.   On: 01/26/2018 15:59     ASSESSMENT and PLAN  1. Seasonal allergies Reassured patient exam and workup reassuring. Treating seasonal allergies. Discussed supportive measures, new meds r/se/b and RTC precautions. Patient educational handout given.  2. Cough - POCT CBC - DG Chest 2 View; Future  3. Community acquired  pneumonia of right upper lobe of lung (HCC) - POCT CBC - DG Chest 2 View; Future  Other orders - fluticasone (FLONASE) 50 MCG/ACT nasal spray; Place 1 spray into both nostrils 2 (two) times daily. - cetirizine (ZYRTEC) 10 MG tablet; Take 1 tablet (10 mg total) by mouth daily.  Return if symptoms worsen or fail to improve.    Myles Lipps, MD [facility] at [facility] [address] [place], [place] [address] Ph.  [phone] Fax [phone]

## 2018-12-06 ENCOUNTER — Telehealth (INDEPENDENT_AMBULATORY_CARE_PROVIDER_SITE_OTHER): Payer: BC Managed Care – PPO | Admitting: Registered Nurse

## 2018-12-06 ENCOUNTER — Encounter: Payer: Self-pay | Admitting: Registered Nurse

## 2018-12-06 DIAGNOSIS — J302 Other seasonal allergic rhinitis: Secondary | ICD-10-CM

## 2018-12-06 DIAGNOSIS — F3342 Major depressive disorder, recurrent, in full remission: Secondary | ICD-10-CM

## 2018-12-06 DIAGNOSIS — Z7689 Persons encountering health services in other specified circumstances: Secondary | ICD-10-CM | POA: Diagnosis not present

## 2018-12-06 MED ORDER — FLUTICASONE PROPIONATE 50 MCG/ACT NA SUSP: 1.0000 | Freq: Two times a day (BID) | NASAL | 6 refills | Status: DC

## 2018-12-06 MED ORDER — CETIRIZINE HCL 10 MG PO TABS: 10.0000 mg | ORAL_TABLET | Freq: Every day | ORAL | 11 refills | Status: AC

## 2018-12-06 MED ORDER — GABAPENTIN 100 MG PO CAPS: ORAL_CAPSULE | ORAL | 3 refills | Status: DC

## 2018-12-06 MED ORDER — SERTRALINE HCL 100 MG PO TABS: 100.0000 mg | ORAL_TABLET | Freq: Every day | ORAL | 3 refills | Status: DC

## 2018-12-06 NOTE — Progress Notes (Signed)
Spoke with pt and she need to [facility] with a PCP to manage medications. She states she does need refills on her Sertraline and her Gabapentin. She states her [facility] doctor states she needs to have these medications filled at her PCP office.

## 2018-12-06 NOTE — Progress Notes (Signed)
Telemedicine Encounter- SOAP NOTE Established Patient  This telephone encounter was conducted with the patient's (or proxy's) verbal consent via audio telecommunications: yes  Patient was instructed to have this encounter in a suitably private space; and to only have persons present to whom they give permission to participate. In addition, patient identity was confirmed by use of name plus two identifiers (DOB and address).  I discussed the limitations, risks, security and privacy concerns of performing an evaluation and management service by telephone and the availability of in person appointments. I also discussed with the patient that there may be a patient responsible charge related to this service. The patient expressed understanding and agreed to proceed.  I spent a total of 16 minutes talking with the patient or their proxy.  No chief complaint on file.   Subjective   Sandra Mills is a 58 y.o. established patient. Telephone visit today for visit to establish care and medication refills.  HPI Pt has history of anxiety and depression, for which she is being treated with sertraline and gabapentin. She was started on this regimen by Dr. Joseph BerkshireAksir, who has since determined that she is stable for follow up in primary care and can visit with him again if need be. She has been on this regimen for 2 years without concern. She is happy to continue on this regimen.  Otherwise, she has a history of menstrual migraines. She has a history of seasonal allergies, for which she takes cetirizine and fluticasone with good effect.  She has a history of colon polyps and is due for a follow up colonoscopy this year, which she plans to schedule soon.  Otherwise, no major complaints today. 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  Patient Active Problem List   Diagnosis Date Noted  . Generalized anxiety disorder 02/27/2017  .  Recurrent major depressive disorder, in full remission (HCC) 12/03/2016  . Migraine, menstrual 06/23/2007    Past Medical History:  Diagnosis Date  . Anxiety   . Colon polyps 12/09/12   three polyps; [facility]; repeat in 3-5 years.  . Depression   . IBS (irritable bowel syndrome)    diarrhea predominant; s/p GI consult with colonoscopy  . Migraine    menstrually related.  . Tremor, essential     Current Outpatient Medications  Medication Sig Dispense Refill  . azelastine (ASTELIN) 0.1 % nasal spray PLACE 2 SPRAYS INTO BOTH NOSTRILS 2 (TWO) TIMES DAILY. USE IN EACH NOSTRIL AS DIRECTED 30 mL 0  . cetirizine (ZYRTEC) 10 MG tablet Take 1 tablet (10 mg total) by mouth daily. 30 tablet 11  . fluticasone (FLONASE) 50 MCG/ACT nasal spray Place 1 spray into both nostrils 2 (two) times daily. 16 g 6  . gabapentin (NEURONTIN) 100 MG capsule TAKE 2 CAPSULES BY MOUTH 2 TIMES DAILY 360 capsule 3  . sertraline (ZOLOFT) 100 MG tablet Take 1 tablet (100 mg total) by mouth daily. For depression 90 tablet 3  . albuterol (PROVENTIL HFA;VENTOLIN HFA) 108 (90 Base) MCG/ACT inhaler Inhale 2 puffs into the lungs every 6 (six) hours as needed for wheezing. (Patient not taking: Reported on 12/06/2018) 1 Inhaler 1   No current facility-administered medications for this visit.     No Known Allergies  Social History   Socioeconomic History  . Marital status: Legally Separated    Spouse name: 11/2015  . Number of children: 4  . Years  of education: Master's  . Highest education level: Not on file  Occupational History  . Occupation: Magazine features editorTEACHER    Employer: [employer]    Comment: Music (choir/voice)  Social Needs  . Financial resource strain: Not hard at all  . Food insecurity    Worry: Never true    Inability: Never true  . Transportation needs    Medical: No    Non-medical: No  Tobacco Use  . Smoking status: Never Smoker  . Smokeless tobacco: Never Used  Substance and Sexual Activity  . Alcohol  use: No  . Drug use: No  . Sexual activity: Yes    Birth control/protection: Surgical    Comment: Husband vasectomy  Lifestyle  . Physical activity    Days per week: 3 days    Minutes per session: 30 min  . Stress: To some extent  Relationships  . Social Musicianconnections    Talks on phone: Three times a week    Gets together: Twice a week    Attends religious service: Patient refused    Active member of club or organization: Patient refused    Attends meetings of clubs or organizations: Patient refused    Relationship status: Patient refused  . Intimate partner violence    Fear of current or ex partner: No    Emotionally abused: No    Physically abused: No    Forced sexual activity: No  Other Topics Concern  . Not on file  Social History Narrative   Marital status:  Married x 31 years, no abuse. Separated 11/2015. Dating.      Lives: Lives alone, children (1993, 1995, 1997, 1999) live with their father and visit her regulalry.  No grandchildren.      Children: 4 children; no grandchildren.      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  Previously worked [employer].   No tobacco/alcohol/drugs; wine three times per year.   Exercise sporadic; curves and [employer].   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      Review of Systems  Constitutional: Negative.   HENT: Negative.   Eyes: Negative.   Respiratory: Negative.   Cardiovascular: Negative.   Gastrointestinal: Negative.   Genitourinary: Negative.   Musculoskeletal: Negative.   Skin: Negative.   Neurological: Negative.   Endo/Heme/Allergies: Negative.   Psychiatric/Behavioral: Negative.  Negative for depression, hallucinations and suicidal ideas. The patient is not nervous/anxious and does not have insomnia.   All other systems reviewed and are negative.   Objective   Vitals as reported by the patient: There were no vitals filed for this visit.  Diagnoses and all orders for this visit:  Encounter to establish  care  Recurrent major depressive disorder, in full remission (HCC) -     gabapentin (NEURONTIN) 100 MG capsule; TAKE 2 CAPSULES BY MOUTH 2 TIMES DAILY -     sertraline (ZOLOFT) 100 MG tablet; Take 1 tablet (100 mg total) by mouth daily. For depression  Seasonal allergies -     cetirizine (ZYRTEC) 10 MG tablet; Take 1 tablet (10 mg total) by mouth daily. -     fluticasone (FLONASE) 50 MCG/ACT nasal spray; Place 1 spray into both nostrils 2 (two) times daily.   PLAN  Given refills on medications x 1 year today - pt understands that I would prefer that she present for a physical exam and lab work in around 6 months.   Otherwise, she sounds healthy and has no complaints. Will follow up as  warranted should problems arise. She was fomerly a patient of Dr. Reginia Forts, since has been bounced around our office - will attach myself to her chart as PCP.  Patient encouraged to call clinic with any questions, comments, or concerns.    I discussed the assessment and treatment plan with the patient. The patient was provided an opportunity to ask questions and all were answered. The patient agreed with the plan and demonstrated an understanding of the instructions.   The patient was advised to call back or seek an in-person evaluation if the symptoms worsen or if the condition fails to improve as anticipated.  I provided 16 minutes of non-face-to-face time during this encounter.  Maximiano Coss, NP  Primary Care at [facility]

## 2019-02-28 ENCOUNTER — Other Ambulatory Visit: Payer: Self-pay

## 2019-02-28 ENCOUNTER — Ambulatory Visit (INDEPENDENT_AMBULATORY_CARE_PROVIDER_SITE_OTHER): Payer: BC Managed Care – PPO | Admitting: Registered Nurse

## 2019-02-28 DIAGNOSIS — Z23 Encounter for immunization: Secondary | ICD-10-CM | POA: Diagnosis not present

## 2019-03-23 ENCOUNTER — Telehealth: Payer: BC Managed Care – PPO | Admitting: Family

## 2019-03-23 DIAGNOSIS — Z20828 Contact with and (suspected) exposure to other viral communicable diseases: Secondary | ICD-10-CM

## 2019-03-23 DIAGNOSIS — Z20822 Contact with and (suspected) exposure to covid-19: Secondary | ICD-10-CM

## 2019-03-23 NOTE — Progress Notes (Signed)
E-Visit for Corona Virus Screening   Your current symptoms could be consistent with the coronavirus.  Many health care providers can now test patients at their office but not all are.  [facility] has multiple testing sites. For information on our COVID testing locations and hours go to [email or web address]  Please quarantine yourself while awaiting your test results.   Please continue good preventive care measures, including:  frequent hand-washing, avoid touching your face, cover coughs/sneezes, stay out of crowds and keep a 6 foot distance from others.   You can go to one of the testing sites listed below, while they are opened (see hours). You do not need an order and will stay in your car during the test. You do need to self isolate until your results return and if positive 14 days from when your symptoms started and until you are 3 days fever free.   Testing Locations (Monday - Friday, 10 a.m. - 3 p.m.) . [place]: [facility], [address], [place], [place]  . [place]: [facility], [address], [place], [place] (entrance off [address])  . [place]: (Closed each Monday): Testing site relocated to the short stay covered drive at [facility]. (Use the [address] entrance to [facility] next to [facility].)    COVID-19 is a respiratory illness with symptoms that are similar to the flu. Symptoms are typically mild to moderate, but there have been cases of severe illness and death due to the virus. The following symptoms may appear 2-14 days after exposure: . Fever . Cough . Shortness of breath or difficulty breathing . Chills . Repeated shaking with chills . Muscle pain . Headache . Sore throat . New loss of taste or smell . Fatigue . Congestion or runny nose . Nausea or vomiting . Diarrhea  If you develop fever/cough/breathlessness, please stay home for  10 days with improving symptoms and until you have had 24 hours of no fever (without taking a fever reducer).  Go to the nearest hospital ED for assessment if fever/cough/breathlessness are severe or illness seems like a threat to life.  It is vitally important that if you feel that you have an infection such as this virus or any other virus that you stay home and away from places where you may spread it to others.  You should avoid contact with people age 65 and older.   You should wear a mask or cloth face covering over your nose and mouth if you must be around other people or animals, including pets (even at home). Try to stay at least 6 feet away from other people. This will protect the people around you.   You may also take acetaminophen (Tylenol) as needed for fever.   Reduce your risk of any infection by using the same precautions used for avoiding the common cold or flu:  . Wash your hands often with soap and warm water for at least 20 seconds.  If soap and water are not readily available, use an alcohol-based hand sanitizer with at least 60% alcohol.  . If coughing or sneezing, cover your mouth and nose by coughing or sneezing into the elbow areas of your shirt or coat, into a tissue or into your sleeve (not your hands). . Avoid shaking hands with others and consider head nods or verbal greetings only. . Avoid touching your eyes, nose, or mouth with unwashed hands.  . Avoid close contact   with people who are sick. . Avoid places or events with large numbers of people in one location, like concerts or sporting events. . Carefully consider travel plans you have or are making. . If you are planning any travel outside or inside the [place], visit the CDC's Travelers' Health webpage for the latest health notices. . If you have some symptoms but not all symptoms, continue to monitor at home and seek medical attention if your symptoms worsen. . If you are having a medical emergency, call 911.  HOME  CARE . Only take medications as instructed by your medical team. . Drink plenty of fluids and get plenty of rest. . A steam or ultrasonic humidifier can help if you have congestion.   GET HELP RIGHT AWAY IF YOU HAVE EMERGENCY WARNING SIGNS** FOR COVID-19. If you or someone is showing any of these signs seek emergency medical care immediately. Call 911 or proceed to your closest emergency facility if: . You develop worsening high fever. . Trouble breathing . Bluish lips or face . Persistent pain or pressure in the chest . New confusion . Inability to wake or stay awake . You cough up blood. . Your symptoms become more severe  **This list is not all possible symptoms. Contact your medical provider for any symptoms that are sever or concerning to you.   MAKE SURE YOU   Understand these instructions.  Will watch your condition.  Will get help right away if you are not doing well or get worse.  Your e-visit answers were reviewed by a board certified advanced clinical practitioner to complete your personal care plan.  Depending on the condition, your plan could have included both over the counter or prescription medications.  If there is a problem please reply once you have received a response from your provider.  Your safety is important to us.  If you have drug allergies check your prescription carefully.    You can use MyChart to ask questions about today's visit, request a non-urgent call back, or ask for a work or school excuse for 24 hours related to this e-Visit. If it has been greater than 24 hours you will need to follow up with your provider, or enter a new e-Visit to address those concerns. You will get an e-mail in the next two days asking about your experience.  I hope that your e-visit has been valuable and will speed your recovery. Thank you for using e-visits.   Approximately 5 minutes was spent documenting and reviewing patient's chart.   

## 2019-09-29 ENCOUNTER — Other Ambulatory Visit: Payer: Self-pay

## 2019-09-29 ENCOUNTER — Encounter: Payer: Self-pay | Admitting: Family Medicine

## 2019-09-29 ENCOUNTER — Ambulatory Visit (INDEPENDENT_AMBULATORY_CARE_PROVIDER_SITE_OTHER): Payer: BC Managed Care – PPO | Admitting: Family Medicine

## 2019-09-29 VITALS — BP 142/80 | HR 103 | Temp 97.9°F | Ht 62.5 in | Wt 176.0 lb

## 2019-09-29 DIAGNOSIS — R21 Rash and other nonspecific skin eruption: Secondary | ICD-10-CM

## 2019-09-29 DIAGNOSIS — H1012 Acute atopic conjunctivitis, left eye: Secondary | ICD-10-CM | POA: Diagnosis not present

## 2019-09-29 DIAGNOSIS — N390 Urinary tract infection, site not specified: Secondary | ICD-10-CM | POA: Diagnosis not present

## 2019-09-29 DIAGNOSIS — R109 Unspecified abdominal pain: Secondary | ICD-10-CM

## 2019-09-29 LAB — POCT URINALYSIS DIP (MANUAL ENTRY)
Bilirubin, UA: NEGATIVE
Blood, UA: NEGATIVE
Glucose, UA: NEGATIVE mg/dL
Ketones, POC UA: NEGATIVE mg/dL
Nitrite, UA: NEGATIVE
Protein Ur, POC: NEGATIVE mg/dL
Spec Grav, UA: 1.02 (ref 1.010–1.025)
Urobilinogen, UA: 0.2 E.U./dL
pH, UA: 5.5 (ref 5.0–8.0)

## 2019-09-29 LAB — POCT CBC
Granulocyte percent: 45.4 %G (ref 37–80)
HCT, POC: 39.8 % (ref 29–41)
Hemoglobin: 13.3 g/dL (ref 11–14.6)
Lymph, poc: 2.5 (ref 0.6–3.4)
MCH, POC: 30.9 pg (ref 27–31.2)
MCHC: 33.4 g/dL (ref 31.8–35.4)
MCV: 92.5 fL (ref 76–111)
MID (cbc): 0.3 (ref 0–0.9)
MPV: 6.2 fL (ref 0–99.8)
POC Granulocyte: 2.3 (ref 2–6.9)
POC LYMPH PERCENT: 48.3 %L (ref 10–50)
POC MID %: 6.3 %M (ref 0–12)
Platelet Count, POC: 308 10*3/uL (ref 142–424)
RBC: 4.31 M/uL (ref 4.04–5.48)
RDW, POC: 13.5 %
WBC: 5.1 10*3/uL (ref 4.6–10.2)

## 2019-09-29 LAB — POCT SKIN KOH: Skin KOH, POC: NEGATIVE

## 2019-09-29 MED ORDER — TRIAMCINOLONE ACETONIDE 0.1 % EX CREA: 1.0000 "application " | TOPICAL_CREAM | Freq: Two times a day (BID) | CUTANEOUS | 0 refills | Status: DC

## 2019-09-29 MED ORDER — NITROFURANTOIN MONOHYD MACRO 100 MG PO CAPS: 100.0000 mg | ORAL_CAPSULE | Freq: Two times a day (BID) | ORAL | 0 refills | Status: DC

## 2019-09-29 MED ORDER — PREDNISOLONE ACETATE 1 % OP SUSP: OPHTHALMIC | 0 refills | Status: DC

## 2019-09-29 NOTE — Progress Notes (Signed)
Patient ID: Sandra Mills, female    DOB: 08-Jun-1960  Age: 59 y.o. MRN: [id number]  Chief Complaint  Patient presents with  . Abdominal Pain    Pt stated she has been experiencing some abdominal pain for the past 3 days and hurts to urinate.    Subjective:   Patient is here primarily because she has been having lower abdominal pain for about 3 days.  She has had some dysuria associated with it.  She has a history of urinary tract infections in the past.  She is sexually involved with her fianc.   no vomiting.  Has had some nausea.  Bowels are acting regularly.  No hematuria.  Last menstrual period was several years ago.  Also complains of itching of her left eye for 3 or 4 days.  She is taken OTC antihistamines including Zyrtec without relief.  He has tried over-the-counter Pataday eyedrops.  She also complains of a rash between her fourth and fifth fingers of the right hand.  She is tried Neosporin and Lotrimin on it and it keeps coming back. Current allergies, medications, problem list, past/family and social histories reviewed.  Objective:  BP (!) 142/80 (BP Location: Right Arm, Patient Position: Sitting, Cuff Size: Normal)   Pulse (!) 103   Temp 97.9 F (36.6 C) (Temporal)   Ht 5' 2.5" (1.588 m)   Wt 176 lb (79.8 kg)   SpO2 96%   BMI 31.68 kg/m   Left eye looks normal conjunctiva not really inflamed but it itches her.  Eyes PERRL.  Fundi benign.  Abdomen has normal bowel sounds, soft without again a megaly masses or tenderness.  Pelvic not done.  Extremities without abnormality.  She does have a little bit of rash between the fourth and fifth fingers of the right hand in the webspace.  Scraping was done and KOH negative.  Urinalysis is a little nonspecific.  Dipstick shows some leukocytes.  See instructions.  Assessment & Plan:   Assessment: 1. Abdominal pain, unspecified abdominal location   2. Rash and nonspecific skin eruption   3. Urinary tract infection without  hematuria, site unspecified   4. Allergic conjunctivitis of left eye       Plan: See instructions.  Orders Placed This Encounter  Procedures  . Urine Culture    Order Specific Question:   Release to patient    Answer:   Immediate  . POCT urinalysis dipstick  . POCT Skin KOH    Rash between right 4th and 5th fingers  . POCT CBC    No orders of the defined types were placed in this encounter.        Patient Instructions    Urine only has a slight evidence of infection.  We will go ahead and treat with Macrodantin 1 twice daily (nitrofurantoin) while the urine culture is pending.  If abdominal pain gets worse go to the emergency room or an urgent care.  Your blood count was normal.  Drink plenty of fluids to continue to flush your bladder through well.  If you are at all constipated I recommend taking a mild laxative to empty out your bowels.  Use the prednisolone eyedrops 1 drop in the left eye 3 times daily for maximum 3 days.  If it is getting worse at any time discontinue the drops.  If symptoms continue to persist get seen by an eye doctor.     If you have lab work done today you will be contacted with your  lab results within the next 2 weeks.  If you have not heard from [place] then please contact us. The fastest way to get your results is to register for My Chart.   IF you received an x-ray today, you will receive an invoice from [facility]. Please contact [facility] at [phone] with questions or concerns regarding your invoice.   IF you received labwork today, you will receive an invoice from [facility]. Please contact [facility] at [phone] with questions or concerns regarding your invoice.   Our billing staff will not be able to assist you with questions regarding bills from these companies.  You will be contacted with the lab results as soon as they are available. The fastest way to get your results is to activate your My Chart account.  Instructions are located on the last page of this paperwork. If you have not heard from [place] regarding the results in 2 weeks, please contact this office.        Return if symptoms worsen or fail to improve.   Ruben Reason, MD 09/29/2019

## 2019-09-29 NOTE — Progress Notes (Signed)
poct

## 2019-09-29 NOTE — Patient Instructions (Addendum)
  Urine only has a slight evidence of infection.  We will go ahead and treat with Macrodantin 1 twice daily (nitrofurantoin) while the urine culture is pending.  If abdominal pain gets worse go to the emergency room or an urgent care.  Your blood count was normal.  Drink plenty of fluids to continue to flush your bladder through well.  If you are at all constipated I recommend taking a mild laxative to empty out your bowels.  Use the prednisolone eyedrops 1 drop in the left eye 3 times daily for maximum 3 days.  If it is getting worse at any time discontinue the drops.  If symptoms continue to persist get seen by an eye doctor.     If you have lab work done today you will be contacted with your lab results within the next 2 weeks.  If you have not heard from [place] then please contact us. The fastest way to get your results is to register for My Chart.   IF you received an x-ray today, you will receive an invoice from [facility]. Please contact [facility] at [phone] with questions or concerns regarding your invoice.   IF you received labwork today, you will receive an invoice from [facility]. Please contact [facility] at [phone] with questions or concerns regarding your invoice.   Our billing staff will not be able to assist you with questions regarding bills from these companies.  You will be contacted with the lab results as soon as they are available. The fastest way to get your results is to activate your My Chart account. Instructions are located on the last page of this paperwork. If you have not heard from [place] regarding the results in 2 weeks, please contact this office.

## 2019-10-01 ENCOUNTER — Encounter: Payer: Self-pay | Admitting: Registered Nurse

## 2019-10-01 LAB — URINE CULTURE

## 2019-10-03 NOTE — Telephone Encounter (Signed)
Pt noted theres strep B in her urine and is asking if she needs to come back in for evaluation. Or can we send in an ABX to the pharmacy without having her come in

## 2019-10-06 NOTE — Progress Notes (Signed)
Call:  The urine culture last week only grew a small number of bacteria, probably insignificant.  However if still having symptoms let me know so I can try a different antiiotic.  Janace Hoard MD

## 2019-11-03 ENCOUNTER — Other Ambulatory Visit: Payer: Self-pay | Admitting: Registered Nurse

## 2019-11-03 DIAGNOSIS — J302 Other seasonal allergic rhinitis: Secondary | ICD-10-CM

## 2019-11-21 ENCOUNTER — Other Ambulatory Visit: Payer: Self-pay | Admitting: Registered Nurse

## 2019-11-21 DIAGNOSIS — F3342 Major depressive disorder, recurrent, in full remission: Secondary | ICD-10-CM

## 2020-01-23 ENCOUNTER — Encounter: Payer: Self-pay | Admitting: Registered Nurse

## 2020-01-31 ENCOUNTER — Other Ambulatory Visit: Payer: Self-pay

## 2020-01-31 ENCOUNTER — Telehealth (INDEPENDENT_AMBULATORY_CARE_PROVIDER_SITE_OTHER): Payer: BC Managed Care – PPO | Admitting: Registered Nurse

## 2020-01-31 ENCOUNTER — Encounter: Payer: Self-pay | Admitting: Registered Nurse

## 2020-01-31 DIAGNOSIS — Z124 Encounter for screening for malignant neoplasm of cervix: Secondary | ICD-10-CM | POA: Diagnosis not present

## 2020-01-31 DIAGNOSIS — Z1231 Encounter for screening mammogram for malignant neoplasm of breast: Secondary | ICD-10-CM

## 2020-01-31 DIAGNOSIS — F3342 Major depressive disorder, recurrent, in full remission: Secondary | ICD-10-CM

## 2020-01-31 DIAGNOSIS — F411 Generalized anxiety disorder: Secondary | ICD-10-CM | POA: Diagnosis not present

## 2020-01-31 MED ORDER — SERTRALINE HCL 50 MG PO TABS: 150.0000 mg | ORAL_TABLET | Freq: Every day | ORAL | 3 refills | Status: DC

## 2020-01-31 MED ORDER — CLONAZEPAM 0.5 MG PO TBDP: 0.5000 mg | ORAL_TABLET | Freq: Every evening | ORAL | 0 refills | Status: DC | PRN

## 2020-01-31 NOTE — Patient Instructions (Signed)
° ° ° °  If you have lab work done today you will be contacted with your lab results within the next 2 weeks.  If you have not heard from us then please contact us. The fastest way to get your results is to register for My Chart. ° ° °IF you received an x-ray today, you will receive an invoice from [facility]. Please contact [facility] at [phone] with questions or concerns regarding your invoice.  ° °IF you received labwork today, you will receive an invoice from [facility]. Please contact [facility] at [phone] with questions or concerns regarding your invoice.  ° °Our billing staff will not be able to assist you with questions regarding bills from these companies. ° °You will be contacted with the lab results as soon as they are available. The fastest way to get your results is to activate your My Chart account. Instructions are located on the last page of this paperwork. If you have not heard from us regarding the results in 2 weeks, please contact this office. °  ° ° ° °

## 2020-01-31 NOTE — Progress Notes (Signed)
Telemedicine Encounter- SOAP NOTE Established Patient  This telephone encounter was conducted with the patient's (or proxy's) verbal consent via audio telecommunications: yes  Patient was instructed to have this encounter in a suitably private space; and to only have persons present to whom they give permission to participate. In addition, patient identity was confirmed by use of name plus two identifiers (DOB and address).  I discussed the limitations, risks, security and privacy concerns of performing an evaluation and management service by telephone and the availability of in person appointments. I also discussed with the patient that there may be a patient responsible charge related to this service. The patient expressed understanding and agreed to proceed.  I spent a total of 14 minutes talking with the patient or their proxy.  Chief Complaint  Patient presents with   Anxiety    pt has been taking antianxiety medication for several years and has been well controlled in that time noticed recently over the last month she is having more break through anxiety over that time and feels hotter temper, feels her meds are not as effective GAD 7=11   Referral    pt needs referral for Pap and Mammo    Subjective   Sandra Mills is a 59 y.o. established patient. Telephone visit today for worsening anxiety.   HPI Over the past month anxiety has worsened. Notes that she has been feeling more prone to panic, has been gaining weight, and has had trouble sleeping. Feeling like she has had hot flashes.  Denies HI/SI Has tolerated sertraline well for a number of years. Hoping to modify this dose.  Patient Active Problem List   Diagnosis Date Noted   Generalized anxiety disorder 02/27/2017   Recurrent major depressive disorder, in full remission (HCC) 12/03/2016   Migraine, menstrual 06/23/2007    Past Medical History:  Diagnosis Date   Anxiety    Colon polyps 12/09/12   three  polyps; [facility]; repeat in 3-5 years.   Depression    IBS (irritable bowel syndrome)    diarrhea predominant; s/p GI consult with colonoscopy   Migraine    menstrually related.   Tremor, essential     Current Outpatient Medications  Medication Sig Dispense Refill   azelastine (ASTELIN) 0.1 % nasal spray PLACE 2 SPRAYS INTO BOTH NOSTRILS 2 (TWO) TIMES DAILY. USE IN EACH NOSTRIL AS DIRECTED 30 mL 0   cetirizine (ZYRTEC) 10 MG tablet Take 1 tablet (10 mg total) by mouth daily. 30 tablet 11   fluticasone (FLONASE) 50 MCG/ACT nasal spray PLACE 1 SPRAY INTO BOTH NOSTRILS 2 (TWO) TIMES DAILY. 48 mL 2   gabapentin (NEURONTIN) 100 MG capsule TAKE 2 CAPSULES BY MOUTH 2 TIMES DAILY 360 capsule 3   nitrofurantoin, macrocrystal-monohydrate, (MACROBID) 100 MG capsule Take 1 capsule (100 mg total) by mouth 2 (two) times daily. 14 capsule 0   prednisoLONE acetate (PRED FORTE) 1 % ophthalmic suspension Use 1 drop left eye 3 times daily for 3 days.  If symptoms persist see eye doctor. 5 mL 0   sertraline (ZOLOFT) 100 MG tablet TAKE 1 TABLET (100 MG TOTAL) BY MOUTH DAILY. FOR DEPRESSION 90 tablet 0   triamcinolone cream (KENALOG) 0.1 % Apply 1 application topically 2 (two) times daily. 15 g 0   clonazePAM (KLONOPIN) 0.5 MG disintegrating tablet Take 1 tablet (0.5 mg total) by mouth at bedtime as needed for seizure. 30 tablet 0   sertraline (ZOLOFT) 50 MG tablet Take 3 tablets (150 mg total)  by mouth daily. 270 tablet 3   No current facility-administered medications for this visit.    No Known Allergies  Social History   Socioeconomic History   Marital status: Legally Separated    Spouse name: 11/2015   Number of children: 4   Years of education: Master's   Highest education level: Not on file  Occupational History   Occupation: Magazine features editor: [employer]    Comment: Music (choir/voice)  Tobacco Use   Smoking status: Never Smoker   Smokeless tobacco: Never Used  Water quality scientist Use: Never used  Substance and Sexual Activity   Alcohol use: No   Drug use: No   Sexual activity: Yes    Birth control/protection: Surgical    Comment: Husband vasectomy  Other Topics Concern   Not on file  Social History Narrative   Marital status:  Married x 31 years, no abuse. Separated 11/2015. Dating.      Lives: Lives alone, children (1993, 1995, 1997, 1999) live with their father and visit her regulalry.  No grandchildren.      Children: 4 children; no grandchildren.      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  Previously worked [employer].   No tobacco/alcohol/drugs; wine three times per year.   Exercise sporadic; curves and [employer].   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     Social Determinants of Health   Financial Resource Strain:    Difficulty of Paying Living Expenses: Not on file  Food Insecurity:    Worried About Running Out of Food in the Last Year: Not on file   Ran Out of Food in the Last Year: Not on file  Transportation Needs:    Lack of Transportation (Medical): Not on file   Lack of Transportation (Non-Medical): Not on file  Physical Activity:    Days of Exercise per Week: Not on file   Minutes of Exercise per Session: Not on file  Stress:    Feeling of Stress : Not on file  Social Connections:    Frequency of Communication with Friends and Family: Not on file   Frequency of Social Gatherings with Friends and Family: Not on file   Attends Religious Services: Not on file   Active Member of Clubs or Organizations: Not on file   Attends Banker Meetings: Not on file   Marital Status: Not on file  Intimate Partner Violence:    Fear of Current or Ex-Partner: Not on file   Emotionally Abused: Not on file   Physically Abused: Not on file   Sexually Abused: Not on file    ROS Per hpi  Objective   Vitals as reported by the patient: There were no vitals filed for this visit.  Bryant was  seen today for anxiety and referral.  Diagnoses and all orders for this visit:  Recurrent major depressive disorder, in full remission (HCC) -     sertraline (ZOLOFT) 50 MG tablet; Take 3 tablets (150 mg total) by mouth daily. -     clonazePAM (KLONOPIN) 0.5 MG disintegrating tablet; Take 1 tablet (0.5 mg total) by mouth at bedtime as needed for seizure.  Generalized anxiety disorder -     sertraline (ZOLOFT) 50 MG tablet; Take 3 tablets (150 mg total) by mouth daily. -     clonazePAM (KLONOPIN) 0.5 MG disintegrating tablet; Take 1 tablet (0.5 mg total) by mouth at bedtime as needed for seizure.  Screening  mammogram for breast cancer -     MM Digital Screening; Future  Papanicolaou smear for cervical cancer screening -     Ambulatory referral to Obstetrics / Gynecology   PLAN  Refer to counseling. Needs new counselor who she has not seen before due to past bad experience  Orders sent for mammo and referral for obgyn for est care / pap  Increase sertraline to 150mg  PO qd  Follow up in 5-6 weeks for med check  Patient encouraged to call clinic with any questions, comments, or concerns.  I discussed the assessment and treatment plan with the patient. The patient was provided an opportunity to ask questions and all were answered. The patient agreed with the plan and demonstrated an understanding of the instructions.   The patient was advised to call back or seek an in-person evaluation if the symptoms worsen or if the condition fails to improve as anticipated.  I provided 14 minutes of non-face-to-face time during this encounter.  , NP  Primary Care at [facility]

## 2020-02-18 ENCOUNTER — Other Ambulatory Visit: Payer: Self-pay | Admitting: Registered Nurse

## 2020-02-18 DIAGNOSIS — F3342 Major depressive disorder, recurrent, in full remission: Secondary | ICD-10-CM

## 2020-02-18 NOTE — Telephone Encounter (Signed)
Requested Prescriptions  Pending Prescriptions Disp Refills  . sertraline (ZOLOFT) 100 MG tablet [Pharmacy Med Name: SERTRALINE HCL 100 MG TABLET] 90 tablet 0    Sig: TAKE 1 TABLET (100 MG TOTAL) BY MOUTH DAILY. FOR DEPRESSION     Psychiatry:  Antidepressants - SSRI Passed - 02/18/2020  8:43 AM      Passed - Completed PHQ-2 or PHQ-9 in the last 360 days.      Passed - Valid encounter within last 6 months    Recent Outpatient Visits          2 weeks ago Recurrent major depressive disorder, in full remission [facility])   [facility], Richard, NP   4 months ago Abdominal pain, unspecified abdominal location   Primary Care at [facility], Sandria Bales, MD   1 year ago Encounter to establish care   [facility], Gerlene Burdock, NP   2 years ago Seasonal allergies   Primary Care at [facility], Meda Coffee, MD   2 years ago Acute upper respiratory infection   Primary Care at [facility], Eilleen Kempf, MD

## 2020-04-09 ENCOUNTER — Encounter: Payer: Self-pay | Admitting: Obstetrics & Gynecology

## 2020-04-09 ENCOUNTER — Ambulatory Visit (INDEPENDENT_AMBULATORY_CARE_PROVIDER_SITE_OTHER): Payer: BC Managed Care – PPO | Admitting: Obstetrics & Gynecology

## 2020-04-09 ENCOUNTER — Other Ambulatory Visit: Payer: Self-pay

## 2020-04-09 ENCOUNTER — Other Ambulatory Visit (HOSPITAL_COMMUNITY)
Admission: RE | Admit: 2020-04-09 | Discharge: 2020-04-09 | Disposition: A | Discharge status: Home / Self Care | Payer: BC Managed Care – PPO | Source: Ambulatory Visit | Attending: Obstetrics & Gynecology | Admitting: Obstetrics & Gynecology

## 2020-04-09 VITALS — BP 127/83 | HR 102 | Ht 63.0 in | Wt 188.5 lb

## 2020-04-09 DIAGNOSIS — Z124 Encounter for screening for malignant neoplasm of cervix: Secondary | ICD-10-CM | POA: Insufficient documentation

## 2020-04-09 DIAGNOSIS — Z1159 Encounter for screening for other viral diseases: Secondary | ICD-10-CM | POA: Diagnosis not present

## 2020-04-09 DIAGNOSIS — Z0000 Encounter for general adult medical examination without abnormal findings: Secondary | ICD-10-CM

## 2020-04-09 DIAGNOSIS — Z01419 Encounter for gynecological examination (general) (routine) without abnormal findings: Secondary | ICD-10-CM

## 2020-04-09 DIAGNOSIS — Z803 Family history of malignant neoplasm of breast: Secondary | ICD-10-CM

## 2020-04-09 NOTE — Progress Notes (Signed)
Having hot flashes, sleep problems & weight gain.

## 2020-04-09 NOTE — Progress Notes (Signed)
59 y.o. G4P0 Legally Separated White or Caucasian female here for new patient exam.  She has not had a MMG or pap smear in several years.  She went into menopause about three years.  She is having hot flashes, night sweats,  Mother died of breast cancer.  She has been scared of starting HRT.  She has tried estroven and this didn't help very much.  No LMP recorded. Patient is perimenopausal.          Sexually active: Yes.    The current method of family planning is post menopausal status.    Exercising: Yes.    walking Smoker:  no  Health Maintenance: Pap:  Doing today History of abnormal Pap:  no MMG:  04/17/2020 Colonoscopy:  2014, three polyps, follow up 3-5 years. Hep C testing: obtained today Screening Labs: Dr. Kateri Plummer   reports that she has never smoked. She has never used smokeless tobacco. She reports that she does not drink alcohol and does not use drugs.  Past Medical History:  Diagnosis Date  . Anxiety   . Colon polyps 12/09/12   three polyps; [facility]; repeat in 3-5 years.  . Depression   . IBS (irritable bowel syndrome)    diarrhea predominant; s/p GI consult with colonoscopy  . Migraine    menstrually related.  . Tremor, essential     Past Surgical History:  Procedure Laterality Date  . COLONOSCOPY W/ POLYPECTOMY  12/09/12   three polyps; [place]. Repeat in 3-5 years.  . Cyst resection perineal    . removed cyst from periniel area      Current Outpatient Medications  Medication Sig Dispense Refill  . azelastine (ASTELIN) 0.1 % nasal spray PLACE 2 SPRAYS INTO BOTH NOSTRILS 2 (TWO) TIMES DAILY. USE IN EACH NOSTRIL AS DIRECTED 30 mL 0  . cetirizine (ZYRTEC) 10 MG tablet Take 1 tablet (10 mg total) by mouth daily. 30 tablet 11  . fluticasone (FLONASE) 50 MCG/ACT nasal spray PLACE 1 SPRAY INTO BOTH NOSTRILS 2 (TWO) TIMES DAILY. 48 mL 2  . gabapentin (NEURONTIN) 100 MG capsule TAKE 2 CAPSULES BY MOUTH 2 TIMES DAILY 360 capsule 3  . sertraline (ZOLOFT) 100 MG tablet TAKE 1  TABLET (100 MG TOTAL) BY MOUTH DAILY. FOR DEPRESSION 90 tablet 0  . sertraline (ZOLOFT) 50 MG tablet Take 3 tablets (150 mg total) by mouth daily. 270 tablet 3  . clonazePAM (KLONOPIN) 0.5 MG disintegrating tablet Take 1 tablet (0.5 mg total) by mouth at bedtime as needed for seizure. (Patient not taking: Reported on 04/09/2020) 30 tablet 0  . prednisoLONE acetate (PRED FORTE) 1 % ophthalmic suspension Use 1 drop left eye 3 times daily for 3 days.  If symptoms persist see eye doctor. (Patient not taking: Reported on 04/09/2020) 5 mL 0  . triamcinolone cream (KENALOG) 0.1 % Apply 1 application topically 2 (two) times daily. (Patient not taking: Reported on 04/09/2020) 15 g 0   No current facility-administered medications for this visit.    Family History  Problem Relation Age of Onset  . Breast cancer Mother 47  . Atrial fibrillation Father   . Atrial fibrillation Sister   . Breast cancer Other        maternal great aunt dx in her 27s  . Breast cancer Other        6 maternal great aunts dx in their 3s    Review of Systems  Exam:   BP 127/83   Pulse (!) 102   Ht 5'  3" (1.6 m)   Wt 188 lb 8 oz (85.5 kg)   BMI 33.39 kg/m   Height: 5\' 3"  (160 cm)  General appearance: alert, cooperative and appears stated age Head: Normocephalic, without obvious abnormality, atraumatic Neck: no adenopathy, supple, symmetrical, trachea midline and thyroid normal to inspection and palpation Lungs: clear to auscultation bilaterally Breasts: normal appearance, no masses or tenderness Heart: regular rate and rhythm Abdomen: soft, non-tender; bowel sounds normal; no masses,  no organomegaly Extremities: extremities normal, atraumatic, no cyanosis or edema Skin: Skin color, texture, turgor normal. No rashes or lesions Lymph nodes: Cervical, supraclavicular, and axillary nodes normal. No abnormal inguinal nodes palpated Neurologic: Grossly normal   Pelvic: External genitalia:  no lesions               Urethra:  normal appearing urethra with no masses, tenderness or lesions              Bartholins and Skenes: normal                 Vagina: normal appearing vagina with normal color and discharge, no lesions              Cervix: no lesions              Pap taken: Yes.   Bimanual Exam:  Uterus:  normal size, contour, position, consistency, mobility, non-tender              Adnexa: normal adnexa and no mass, fullness, tenderness               Rectovaginal: Confirms               Anus:  normal sphincter tone, no lesions  Chaperone, , CMA, was present for exam.  Assessment/Plan: 1. Well woman exam with routine gynecological exam - Cytology - PAP( [place]) - Mammogram is scheduled - Pt is overdue to colon cancer screening with h/o polyp.  She is established with Dr. Marykay Lex and knows to call to schedule  2. Cervical cancer screening - Cytology - PAP( [place])  3. Encounter for hepatitis C screening test for low risk patient - Hepatitis C antibody  4. Blood tests for routine general physical examination - CBC - Comprehensive metabolic panel - Hemoglobin A1c - TSH  5. Family history of breast cancer - Will reach out to Elnoria Howard to see if additional genetic testing is warranted. - D/w pt possibility of having breast MRIs as well as screening MMGs done.  Will await communication from Maylon Cos first.  6.  Vasomotor symptoms - pt is going to start taking her gabapentin at night and titrate up every 3 nights by 100mg .  Will reach out to Maylon Cos to make sure he is ok with me increasing this dosage.  Pt aware this is off-label use of this medication but that it is safe and used for these symptoms in women trying to not take estrogen products.

## 2020-04-10 ENCOUNTER — Encounter: Payer: Self-pay | Admitting: Registered Nurse

## 2020-04-10 ENCOUNTER — Encounter: Payer: Self-pay | Admitting: Genetic Counselor

## 2020-04-10 ENCOUNTER — Other Ambulatory Visit: Payer: Self-pay | Admitting: Obstetrics & Gynecology

## 2020-04-10 DIAGNOSIS — Z803 Family history of malignant neoplasm of breast: Secondary | ICD-10-CM

## 2020-04-10 LAB — COMPREHENSIVE METABOLIC PANEL
ALT: 44 IU/L — ABNORMAL HIGH (ref 0–32)
AST: 32 IU/L (ref 0–40)
Albumin/Globulin Ratio: 1.6 (ref 1.2–2.2)
Albumin: 4.8 g/dL (ref 3.8–4.9)
Alkaline Phosphatase: 109 IU/L (ref 44–121)
BUN/Creatinine Ratio: 18 (ref 9–23)
BUN: 13 mg/dL (ref 6–24)
Bilirubin Total: 0.3 mg/dL (ref 0.0–1.2)
CO2: 24 mmol/L (ref 20–29)
Calcium: 9.2 mg/dL (ref 8.7–10.2)
Chloride: 102 mmol/L (ref 96–106)
Creatinine, Ser: 0.72 mg/dL (ref 0.57–1.00)
GFR calc Af Amer: 107 mL/min/{1.73_m2} (ref >59)
GFR calc non Af Amer: 93 mL/min/{1.73_m2} (ref >59)
Globulin, Total: 3 g/dL (ref 1.5–4.5)
Glucose: 102 mg/dL — ABNORMAL HIGH (ref 65–99)
Potassium: 4.6 mmol/L (ref 3.5–5.2)
Sodium: 140 mmol/L (ref 134–144)
Total Protein: 7.8 g/dL (ref 6.0–8.5)

## 2020-04-10 LAB — CBC
Hematocrit: 41.2 % (ref 34.0–46.6)
Hemoglobin: 13.7 g/dL (ref 11.1–15.9)
MCH: 30 pg (ref 26.6–33.0)
MCHC: 33.3 g/dL (ref 31.5–35.7)
MCV: 90 fL (ref 79–97)
Platelets: 302 10*3/uL (ref 150–450)
RBC: 4.56 x10E6/uL (ref 3.77–5.28)
RDW: 13 % (ref 11.7–15.4)
WBC: 8 10*3/uL (ref 3.4–10.8)

## 2020-04-10 LAB — TSH: TSH: 2.23 u[IU]/mL (ref 0.450–4.500)

## 2020-04-10 LAB — HEMOGLOBIN A1C
Est. average glucose Bld gHb Est-mCnc: 103 mg/dL
Hgb A1c MFr Bld: 5.2 % (ref 4.8–5.6)

## 2020-04-10 LAB — HEPATITIS C ANTIBODY: Hep C Virus Ab: <0.1 s/co ratio (ref 0.0–0.9)

## 2020-04-10 NOTE — Progress Notes (Unsigned)
Received an in basket message from Dr. Sabra Heck asking about whether patient needs updated genetic testing.  Patient had been seen in 2014 and received genetic testing from [facility] through the Breast/Ovarian cancer panel.  The Breast/Ovarian gene panel offered by [facility] includes sequencing and rearrangement analysis for the following 20 genes:  ATM, BARD1, BRCA1, BRCA2, BRIP1, CDH1, CHEK2, EPCAM, FANCC, MLH1, MSH2, MSH6, NBN, PALB2, PMS2, PTEN, RAD51C, RAD51D, TP53, and XRCC2.   Her results were negative.  I discussed that the patient was tested for all of the high and moderate risk genes that would change her medical management.  However, Based on a current [employer], she has a 19.2% risk for breast cancer, placing her at just under a high risk classification for breast cancer.  This most likely is being driven by her category C breast density (from 2018) and her current ht/wt, as well as family history.  I think that a referral to the [facility] would be a good place for her to be seen, where they can discuss ways to lower her risk for breast cancer as well as refer for high risk screening if they feel it is warranted.

## 2020-04-13 ENCOUNTER — Other Ambulatory Visit: Payer: Self-pay | Admitting: Registered Nurse

## 2020-04-13 DIAGNOSIS — Z1211 Encounter for screening for malignant neoplasm of colon: Secondary | ICD-10-CM

## 2020-04-13 LAB — CYTOLOGY - PAP
Comment: NEGATIVE
Diagnosis: NEGATIVE
High risk HPV: NEGATIVE

## 2020-04-17 ENCOUNTER — Other Ambulatory Visit: Payer: Self-pay

## 2020-04-17 ENCOUNTER — Ambulatory Visit
Admission: RE | Admit: 2020-04-17 | Discharge: 2020-04-17 | Disposition: A | Discharge status: Home / Self Care | Payer: BC Managed Care – PPO | Source: Ambulatory Visit | Attending: Registered Nurse | Admitting: Registered Nurse

## 2020-04-17 ENCOUNTER — Other Ambulatory Visit: Payer: Self-pay | Admitting: Registered Nurse

## 2020-04-17 ENCOUNTER — Ambulatory Visit: Payer: BC Managed Care – PPO

## 2020-04-17 DIAGNOSIS — Z1231 Encounter for screening mammogram for malignant neoplasm of breast: Secondary | ICD-10-CM

## 2020-05-25 ENCOUNTER — Other Ambulatory Visit: Payer: Self-pay | Admitting: Registered Nurse

## 2020-05-25 DIAGNOSIS — F3342 Major depressive disorder, recurrent, in full remission: Secondary | ICD-10-CM

## 2020-05-27 ENCOUNTER — Other Ambulatory Visit: Payer: Self-pay | Admitting: Registered Nurse

## 2020-05-27 DIAGNOSIS — F3342 Major depressive disorder, recurrent, in full remission: Secondary | ICD-10-CM

## 2020-05-27 NOTE — Telephone Encounter (Signed)
Requested Prescriptions  Pending Prescriptions Disp Refills  . sertraline (ZOLOFT) 100 MG tablet [Pharmacy Med Name: SERTRALINE HCL 100 MG TABLET] 90 tablet 0    Sig: TAKE 1 TABLET (100 MG TOTAL) BY MOUTH DAILY. FOR DEPRESSION     Psychiatry:  Antidepressants - SSRI Passed - 05/27/2020  9:23 AM      Passed - Completed PHQ-2 or PHQ-9 in the last 360 days      Passed - Valid encounter within last 6 months    Recent Outpatient Visits          3 months ago Recurrent major depressive disorder, in full remission [facility])   [facility], Gerlene Burdock, NP   8 months ago Abdominal pain, unspecified abdominal location   Primary Care at [facility], Sandria Bales, MD   1 year ago Encounter to establish care   [facility], Gerlene Burdock, NP   2 years ago Seasonal allergies   Primary Care at [facility], Meda Coffee, MD   2 years ago Acute upper respiratory infection   Primary Care at [facility], Eilleen Kempf, MD

## 2020-08-25 ENCOUNTER — Other Ambulatory Visit: Payer: Self-pay | Admitting: Registered Nurse

## 2020-08-25 DIAGNOSIS — F3342 Major depressive disorder, recurrent, in full remission: Secondary | ICD-10-CM

## 2020-08-27 NOTE — Telephone Encounter (Signed)
LFD 05/25/20 #360 with no refills LOV 01/31/20 NOV none

## 2020-11-24 ENCOUNTER — Other Ambulatory Visit: Payer: Self-pay | Admitting: Registered Nurse

## 2020-11-24 DIAGNOSIS — F3342 Major depressive disorder, recurrent, in full remission: Secondary | ICD-10-CM

## 2021-01-30 ENCOUNTER — Encounter: Payer: Self-pay | Admitting: Registered Nurse

## 2021-02-01 ENCOUNTER — Emergency Department (HOSPITAL_BASED_OUTPATIENT_CLINIC_OR_DEPARTMENT_OTHER)
Admission: EM | Admit: 2021-02-01 | Discharge: 2021-02-01 | Disposition: A | Discharge status: Home / Self Care | Payer: BC Managed Care – PPO | Attending: Emergency Medicine | Admitting: Emergency Medicine

## 2021-02-01 ENCOUNTER — Other Ambulatory Visit: Payer: Self-pay

## 2021-02-01 ENCOUNTER — Emergency Department (HOSPITAL_BASED_OUTPATIENT_CLINIC_OR_DEPARTMENT_OTHER): Payer: BC Managed Care – PPO

## 2021-02-01 ENCOUNTER — Encounter (HOSPITAL_BASED_OUTPATIENT_CLINIC_OR_DEPARTMENT_OTHER): Payer: Self-pay | Admitting: *Deleted

## 2021-02-01 ENCOUNTER — Emergency Department (HOSPITAL_BASED_OUTPATIENT_CLINIC_OR_DEPARTMENT_OTHER): Payer: BC Managed Care – PPO | Admitting: Radiology

## 2021-02-01 ENCOUNTER — Ambulatory Visit: Payer: BC Managed Care – PPO | Admitting: Registered Nurse

## 2021-02-01 ENCOUNTER — Ambulatory Visit: Payer: BC Managed Care – PPO | Admitting: Physician Assistant

## 2021-02-01 VITALS — BP 124/79 | HR 99 | Temp 98.2°F | Ht 63.0 in | Wt 201.2 lb

## 2021-02-01 DIAGNOSIS — K644 Residual hemorrhoidal skin tags: Secondary | ICD-10-CM | POA: Insufficient documentation

## 2021-02-01 DIAGNOSIS — R197 Diarrhea, unspecified: Secondary | ICD-10-CM | POA: Diagnosis not present

## 2021-02-01 DIAGNOSIS — M546 Pain in thoracic spine: Secondary | ICD-10-CM | POA: Diagnosis not present

## 2021-02-01 DIAGNOSIS — K625 Hemorrhage of anus and rectum: Secondary | ICD-10-CM

## 2021-02-01 DIAGNOSIS — K760 Fatty (change of) liver, not elsewhere classified: Secondary | ICD-10-CM | POA: Insufficient documentation

## 2021-02-01 DIAGNOSIS — R42 Dizziness and giddiness: Secondary | ICD-10-CM

## 2021-02-01 DIAGNOSIS — R0789 Other chest pain: Secondary | ICD-10-CM | POA: Diagnosis not present

## 2021-02-01 DIAGNOSIS — R079 Chest pain, unspecified: Secondary | ICD-10-CM

## 2021-02-01 DIAGNOSIS — M533 Sacrococcygeal disorders, not elsewhere classified: Secondary | ICD-10-CM | POA: Diagnosis not present

## 2021-02-01 DIAGNOSIS — K649 Unspecified hemorrhoids: Secondary | ICD-10-CM

## 2021-02-01 LAB — CBC WITH DIFFERENTIAL/PLATELET
Abs Immature Granulocytes: 0.03 10*3/uL (ref 0.00–0.07)
Basophils Absolute: 0 10*3/uL (ref 0.0–0.1)
Basophils Relative: 0 %
Eosinophils Absolute: 0.1 10*3/uL (ref 0.0–0.5)
Eosinophils Relative: 1 %
HCT: 38.9 % (ref 36.0–46.0)
Hemoglobin: 12.9 g/dL (ref 12.0–15.0)
Immature Granulocytes: 0 %
Lymphocytes Relative: 34 %
Lymphs Abs: 2.7 10*3/uL (ref 0.7–4.0)
MCH: 29.4 pg (ref 26.0–34.0)
MCHC: 33.2 g/dL (ref 30.0–36.0)
MCV: 88.6 fL (ref 80.0–100.0)
Monocytes Absolute: 0.6 10*3/uL (ref 0.1–1.0)
Monocytes Relative: 8 %
Neutro Abs: 4.4 10*3/uL (ref 1.7–7.7)
Neutrophils Relative %: 57 %
Platelets: 318 10*3/uL (ref 150–400)
RBC: 4.39 MIL/uL (ref 3.87–5.11)
RDW: 14.6 % (ref 11.5–15.5)
WBC: 7.8 10*3/uL (ref 4.0–10.5)
nRBC: 0 % (ref 0.0–0.2)

## 2021-02-01 LAB — COMPREHENSIVE METABOLIC PANEL
ALT: 60 U/L — ABNORMAL HIGH (ref 0–44)
AST: 29 U/L (ref 15–41)
Albumin: 4.7 g/dL (ref 3.5–5.0)
Alkaline Phosphatase: 90 U/L (ref 38–126)
Anion gap: 10 (ref 5–15)
BUN: 14 mg/dL (ref 6–20)
CO2: 26 mmol/L (ref 22–32)
Calcium: 10 mg/dL (ref 8.9–10.3)
Chloride: 101 mmol/L (ref 98–111)
Creatinine, Ser: 0.66 mg/dL (ref 0.44–1.00)
GFR, Estimated: >60 mL/min (ref >60)
Glucose, Bld: 89 mg/dL (ref 70–99)
Potassium: 4.4 mmol/L (ref 3.5–5.1)
Sodium: 137 mmol/L (ref 135–145)
Total Bilirubin: 0.5 mg/dL (ref 0.3–1.2)
Total Protein: 7.9 g/dL (ref 6.5–8.1)

## 2021-02-01 LAB — URINALYSIS, ROUTINE W REFLEX MICROSCOPIC
Bilirubin Urine: NEGATIVE
Glucose, UA: NEGATIVE mg/dL
Ketones, ur: NEGATIVE mg/dL
Nitrite: NEGATIVE
Protein, ur: NEGATIVE mg/dL
Specific Gravity, Urine: 1.009 (ref 1.005–1.030)
pH: 6.5 (ref 5.0–8.0)

## 2021-02-01 LAB — MAGNESIUM: Magnesium: 2 mg/dL (ref 1.7–2.4)

## 2021-02-01 MED ORDER — METHOCARBAMOL 500 MG PO TABS
500.0000 mg | ORAL_TABLET | Freq: Once | ORAL | Status: AC
  Administered 2021-02-01: 500 mg via ORAL
  Filled 2021-02-01: qty 1

## 2021-02-01 MED ORDER — LACTATED RINGERS IV BOLUS
1000.0000 mL | Freq: Once | INTRAVENOUS | Status: AC
  Administered 2021-02-01: 1000 mL via INTRAVENOUS

## 2021-02-01 MED ORDER — KETOROLAC TROMETHAMINE 15 MG/ML IJ SOLN
15.0000 mg | Freq: Once | INTRAMUSCULAR | Status: AC
  Administered 2021-02-01: 15 mg via INTRAVENOUS
  Filled 2021-02-01: qty 1

## 2021-02-01 MED ORDER — IOHEXOL 300 MG/ML  SOLN: 100.0000 mL | Freq: Once | INTRAMUSCULAR | Status: DC | PRN

## 2021-02-01 MED ORDER — IOHEXOL 350 MG/ML SOLN
80.0000 mL | Freq: Once | INTRAVENOUS | Status: AC | PRN
  Administered 2021-02-01: 80 mL via INTRAVENOUS

## 2021-02-01 MED ORDER — METHOCARBAMOL 500 MG PO TABS: 500.0000 mg | ORAL_TABLET | Freq: Two times a day (BID) | ORAL | 0 refills | Status: DC | PRN

## 2021-02-01 NOTE — Patient Instructions (Signed)
Please go to [facility] ED at [facility]. You need stat labs and imaging done.

## 2021-02-01 NOTE — Progress Notes (Signed)
Subjective:    Patient ID: Sandra Mills, female    DOB: 1960/06/21, 60 y.o.   MRN: [id number]  Chief Complaint  Patient presents with   Back Pain   Rectal Bleeding     HPI Patient is in today for lumbar and sacral back pain x 1.5 weeks. Only feels it lower in the mornings and then pain migrates up as day goes on. 1/10 then 8-9/10 by the evenings. She feels it in her neck and shoulders. Acetaminophen, Ibuprofen, Meloxicam have helped her to sleep. Plays the piano and this worsens her pain. Denies any known injury.  Currently has mid-back pain. 5/10 pain at bra-line.   Some shortness of breath with walking the other day, couldn't talk and walk. Also had worse back pain after the walk. She has also felt tired and dizzy a few times.   Thinks she might have a bleeding hemorrhoid, but says she doesn't have a history of hemorrhoids. She bled through her pants today and her husband had to bring her new pants and underwear.   She has had intermittent nausea, worse when the pain is worst. No vomiting. Diarrhea on and off this whole time, bright red blood with diarrhea. 3 loose stools today. Denies any dysuria.   No new medications. Menopause around age 41 or 30.   Past Medical History:  Diagnosis Date   Anxiety    Colon polyps 12/09/12   three polyps; [facility]; repeat in 3-5 years.   Depression    IBS (irritable bowel syndrome)    diarrhea predominant; s/p GI consult with colonoscopy   Migraine    menstrually related.   Tremor, essential     Past Surgical History:  Procedure Laterality Date   COLONOSCOPY W/ POLYPECTOMY  12/09/12   three polyps; [place]. Repeat in 3-5 years.   Cyst resection perineal     removed cyst from periniel area      Family History  Problem Relation Age of Onset   Breast cancer Mother 9   Atrial fibrillation Father    Atrial fibrillation Sister    Breast cancer Other        maternal great aunt dx in her 39s   Breast cancer Other        6 maternal great  aunts dx in their 49s    Social History   Tobacco Use   Smoking status: Never   Smokeless tobacco: Never  Vaping Use   Vaping Use: Never used  Substance Use Topics   Alcohol use: No   Drug use: No     No Known Allergies  Review of Systems REFER TO HPI FOR PERTINENT POSITIVES AND NEGATIVES      Objective:     BP 124/79   Pulse 99   Temp 98.2 F (36.8 C)   Ht 5\' 3"  (1.6 m)   Wt 201 lb 4 oz (91.3 kg)   SpO2 97%   BMI 35.65 kg/m   Wt Readings from Last 3 Encounters:  02/01/21 201 lb 4 oz (91.3 kg)  04/09/20 188 lb 8 oz (85.5 kg)  09/29/19 176 lb (79.8 kg)    BP Readings from Last 3 Encounters:  02/01/21 124/79  04/09/20 127/83  09/29/19 (!) 142/80     Physical Exam Vitals and nursing note reviewed.  Constitutional:      Appearance: Normal appearance. She is normal weight. She is not toxic-appearing.     Comments: Nervous appearing, some shaking in her hands  HENT:  Head: Normocephalic and atraumatic.     Right Ear: External ear normal.     Left Ear: External ear normal.     Nose: Nose normal.     Mouth/Throat:     Mouth: Mucous membranes are moist.  Eyes:     Extraocular Movements: Extraocular movements intact.     Conjunctiva/sclera: Conjunctivae normal.     Pupils: Pupils are equal, round, and reactive to light.  Cardiovascular:     Rate and Rhythm: Normal rate and regular rhythm.     Pulses: Normal pulses.     Heart sounds: Normal heart sounds.  Pulmonary:     Effort: Pulmonary effort is normal.     Breath sounds: Normal breath sounds.  Abdominal:     General: Abdomen is flat. Bowel sounds are normal.     Palpations: Abdomen is soft.  Genitourinary:    Vagina: Normal.     Cervix: Normal.     Uterus: Normal.      Adnexa: Right adnexa normal and left adnexa normal.     Rectum: Tenderness and external hemorrhoid (BRBPR present) present.  Musculoskeletal:        General: Normal range of motion.     Cervical back: Normal range of motion  and neck supple.       Legs:  Skin:    General: Skin is warm and dry.  Neurological:     General: No focal deficit present.     Mental Status: She is alert and oriented to person, place, and time.  Psychiatric:        Behavior: Behavior normal.        Thought Content: Thought content normal.        Judgment: Judgment normal.     Comments: anxious       Assessment & Plan:   Problem List Items Addressed This Visit   None Visit Diagnoses     Chest pain, unspecified type    -  Primary   Relevant Orders   EKG 12-Lead (Completed)   Dizziness       Rectal bleeding          1. Chest pain, unspecified type 2. Dizziness 3. Rectal bleeding 4. Sacral back pain Concerning symptoms with no clear answer on exam today. Her EKG showed NSR with 92 bpm, no ST or T-wave changes. Her bright red blood per rectum was quite profuse and I cannot tell whether or not this is all coming from the external hemorrhoid or if she has internal bleeding as well. Her pelvic exam was normal aside from complaining of some posterior vaginal wall pain with palpation.  Her last colonoscopy was done in 2014 and benign polyps were found; she is overdue for follow up. Her family hx is significant for breast cancer and her last mammogram was normal on 04/17/20.   I advised her to go to the ED for further evaluation including stat labs and imaging. Her fiance is with her and will take her via personal vehicle. I called the ED at [facility] ahead of her arrival.    This note was prepared with assistance of Dragon voice recognition software. Occasional wrong-word or sound-a-like substitutions may have occurred due to the inherent limitations of voice recognition software.  Time Spent: In addition to time spent for ekg, I spent 65 minutes of total time on the date of the encounter performing the following actions: chart review prior to seeing the patient, obtaining history, performing a medically necessary exam, counseling  on the treatment plan, placing orders, and documenting in our EHR.     Maximilliano Kersh M Arjun Hard, PA-C

## 2021-02-01 NOTE — ED Notes (Signed)
Patient transported to CT 

## 2021-02-01 NOTE — ED Notes (Signed)
Rectal Bleeding (bright red) began within the last 24 hours. No history of this. Back pain and diarrhea the past 2 weeks.

## 2021-02-01 NOTE — ED Provider Notes (Signed)
[facility] EMERGENCY DEPT Provider Note   CSN: [id number] Arrival date & time: 02/01/21  1457     History Chief Complaint  Patient presents with   Rectal Bleeding   Back Pain   Diarrhea    Sandra Mills is a 60 y.o. female.   Rectal Bleeding Associated symptoms: no abdominal pain, no dizziness, no fever, no light-headedness and no vomiting   Back Pain Associated symptoms: chest pain   Associated symptoms: no abdominal pain, no dysuria, no fever, no headaches, no numbness, no pelvic pain and no weakness   Diarrhea Associated symptoms: no abdominal pain, no arthralgias, no chills, no diaphoresis, no fever, no headaches, no myalgias and no vomiting   Patient presents from her family doctor's office for rectal bleeding.  Additional recent medical history includes a week and a half of pain that started on the posterior aspect of her right hip.  She states that the pain gets worse throughout the day.  Throughout the day, it also begins radiating to her thoracic back and into her left side of chest.  She has been taking NSAIDs and Tylenol to help with the pain.  Last pain medication was ordered this morning.  4 days ago, she developed diarrhea.  Diarrhea has resolved.  Last night, she developed rectal bleeding and pain.  Bleeding and pain have been persistent since last night.  At her family doctor office, "profuse rectal bleeding" was noted.  At that time, she also underwent a pelvic exam which was normal.     Past Medical History:  Diagnosis Date   Anxiety    Colon polyps 12/09/12   three polyps; [facility]; repeat in 3-5 years.   Depression    IBS (irritable bowel syndrome)    diarrhea predominant; s/p GI consult with colonoscopy   Migraine    menstrually related.   Tremor, essential     Patient Active Problem List   Diagnosis Date Noted   Generalized anxiety disorder 02/27/2017   Recurrent major depressive disorder, in full remission (HCC) 12/03/2016    Migraine, menstrual 06/23/2007    Past Surgical History:  Procedure Laterality Date   COLONOSCOPY W/ POLYPECTOMY  12/09/12   three polyps; [place]. Repeat in 3-5 years.   Cyst resection perineal     removed cyst from periniel area       OB History     Gravida  4   Para      Term      Preterm      AB      Living  4      SAB      IAB      Ectopic      Multiple      Live Births  4           Family History  Problem Relation Age of Onset   Breast cancer Mother 32   Atrial fibrillation Father    Atrial fibrillation Sister    Breast cancer Other        maternal great aunt dx in her 62s   Breast cancer Other        6 maternal great aunts dx in their 66s    Social History   Tobacco Use   Smoking status: Never   Smokeless tobacco: Never  Vaping Use   Vaping Use: Never used  Substance Use Topics   Alcohol use: Yes    Comment: rarely   Drug use: No    Home Medications  Prior to Admission medications   Medication Sig Start Date End Date Taking? Authorizing Provider  azelastine (ASTELIN) 0.1 % nasal spray PLACE 2 SPRAYS INTO BOTH NOSTRILS 2 (TWO) TIMES DAILY. USE IN EACH NOSTRIL AS DIRECTED 08/12/17  Yes Jeffery, Chelle, PA  cetirizine (ZYRTEC) 10 MG tablet Take 1 tablet (10 mg total) by mouth daily. 12/06/18  Yes Janeece Agee, NP  gabapentin (NEURONTIN) 100 MG capsule TAKE 2 CAPSULES BY MOUTH TWICE A DAY 11/25/20  Yes Janeece Agee, NP  methocarbamol (ROBAXIN) 500 MG tablet Take 1 tablet (500 mg total) by mouth 2 (two) times daily as needed for muscle spasms. 02/01/21  Yes Gloris Manchester, MD  sertraline (ZOLOFT) 50 MG tablet Take 3 tablets (150 mg total) by mouth daily. 01/31/20  Yes Janeece Agee, NP  fluticasone (FLONASE) 50 MCG/ACT nasal spray PLACE 1 SPRAY INTO BOTH NOSTRILS 2 (TWO) TIMES DAILY. 11/03/19   Janeece Agee, NP    Allergies    Patient has no known allergies.  Review of Systems   Review of Systems  Constitutional:  Negative for chills,  diaphoresis, fatigue and fever.  HENT:  Negative for congestion, ear pain and sore throat.   Eyes:  Negative for pain and visual disturbance.  Respiratory:  Negative for cough, chest tightness and shortness of breath.   Cardiovascular:  Positive for chest pain. Negative for palpitations and leg swelling.  Gastrointestinal:  Positive for anal bleeding, diarrhea (Resolved), hematochezia and rectal pain. Negative for abdominal pain, nausea and vomiting.  Genitourinary:  Negative for dysuria, flank pain, hematuria and pelvic pain.  Musculoskeletal:  Positive for back pain. Negative for arthralgias, joint swelling and myalgias.  Skin:  Negative for color change and rash.  Neurological:  Negative for dizziness, seizures, syncope, weakness, light-headedness, numbness and headaches.  Hematological:  Does not bruise/bleed easily.  All other systems reviewed and are negative.  Physical Exam Updated Vital Signs BP 126/72 (BP Location: Right Arm)   Pulse 82   Temp 98 F (36.7 C) (Oral)   Resp 15   Ht 5' 2.5" (1.588 m)   Wt 90.7 kg   SpO2 97%   BMI 36.00 kg/m   Physical Exam Vitals and nursing note reviewed. Exam conducted with a chaperone present.  Constitutional:      General: She is not in acute distress.    Appearance: Normal appearance. She is well-developed. She is not ill-appearing, toxic-appearing or diaphoretic.  HENT:     Head: Normocephalic and atraumatic.     Right Ear: External ear normal.     Left Ear: External ear normal.     Nose: Nose normal.     Mouth/Throat:     Mouth: Mucous membranes are moist.     Pharynx: Oropharynx is clear.  Eyes:     General: No scleral icterus.    Extraocular Movements: Extraocular movements intact.     Conjunctiva/sclera: Conjunctivae normal.  Cardiovascular:     Rate and Rhythm: Normal rate and regular rhythm.     Heart sounds: No murmur heard. Pulmonary:     Effort: Pulmonary effort is normal. No respiratory distress.     Breath  sounds: Normal breath sounds. No wheezing or rales.  Chest:     Chest wall: No tenderness.  Abdominal:     Palpations: Abdomen is soft.     Tenderness: There is abdominal tenderness. There is no right CVA tenderness, left CVA tenderness or guarding.  Genitourinary:    Rectum: Tenderness and external hemorrhoid present.  Musculoskeletal:  General: Normal range of motion.     Cervical back: Normal range of motion and neck supple. No rigidity.     Right lower leg: No edema.     Left lower leg: No edema.  Skin:    General: Skin is warm and dry.     Capillary Refill: Capillary refill takes less than 2 seconds.     Coloration: Skin is not jaundiced or pale.  Neurological:     General: No focal deficit present.     Mental Status: She is alert and oriented to person, place, and time.     Cranial Nerves: No cranial nerve deficit.     Sensory: No sensory deficit.     Motor: No weakness.  Psychiatric:        Mood and Affect: Mood normal.        Behavior: Behavior normal.    ED Results / Procedures / Treatments   Labs (all labs ordered are listed, but only abnormal results are displayed) Labs Reviewed  COMPREHENSIVE METABOLIC PANEL - Abnormal; Notable for the following components:      Result Value   ALT 60 (*)    All other components within normal limits  URINALYSIS, ROUTINE W REFLEX MICROSCOPIC - Abnormal; Notable for the following components:   Color, Urine COLORLESS (*)    Hgb urine dipstick SMALL (*)    Leukocytes,Ua SMALL (*)    All other components within normal limits  CBC WITH DIFFERENTIAL/PLATELET  MAGNESIUM    EKG EKG Interpretation  Date/Time:  Friday February 01 2021 19:20:21 EDT Ventricular Rate:  83 PR Interval:  172 QRS Duration: 83 QT Interval:  374 QTC Calculation: 440 R Axis:   68 Text Interpretation: Sinus rhythm Confirmed by Gloris Manchester ([id number]) on 02/01/2021 7:21:24 PM  Radiology CT Angio Chest PE W and/or Wo Contrast  Result Date:  02/01/2021 CLINICAL DATA:  PE suspected, high prob; Flank pain, kidney stone suspected LUQ abdominal pain. Chest pain, back pain, diarrhea, and bright red blood per rectum. EXAM: CT ANGIOGRAPHY CHEST CT ABDOMEN AND PELVIS WITH CONTRAST TECHNIQUE: Multidetector CT imaging of the chest was performed using the standard protocol during bolus administration of intravenous contrast. Multiplanar CT image reconstructions and MIPs were obtained to evaluate the vascular anatomy. Multidetector CT imaging of the abdomen and pelvis was performed using the standard protocol during bolus administration of intravenous contrast. CONTRAST:  82mL OMNIPAQUE IOHEXOL 350 MG/ML SOLN COMPARISON:  02/21/2011 FINDINGS: CTA CHEST FINDINGS Cardiovascular: There is adequate opacification of the pulmonary arterial tree. No intraluminal filling defect identified to suggest acute pulmonary embolism. The central pulmonary arteries are of normal caliber. No significant coronary artery calcification. Global cardiac size within normal limits. No pericardial effusion. The thoracic aorta is unremarkable. Mediastinum/Nodes: No discrete thyroid nodule identified. Stable probable residual thymic tissue within the anterior mediastinum. No pathologic thoracic adenopathy. The esophagus is unremarkable. Small hiatal hernia. Lungs/Pleura: Lungs are clear. No pleural effusion or pneumothorax. Musculoskeletal: No acute bone abnormality. No lytic or blastic bone lesion. Review of the MIP images confirms the above findings. CT ABDOMEN and PELVIS FINDINGS Hepatobiliary: Multiple cysts are seen scattered throughout the liver. Mild hepatic steatosis. Mild hepatomegaly has progressed in the interval since prior examination no enhancing intrahepatic mass. No intra or extrahepatic biliary ductal dilation. Gallbladder is unremarkable. Pancreas: Unremarkable Spleen: Unremarkable Adrenals/Urinary Tract: Adrenal glands are unremarkable. Kidneys are normal, without renal  calculi, focal lesion, or hydronephrosis. Bladder is unremarkable. Stomach/Bowel: Stomach is within normal limits. Appendix appears normal. No  evidence of bowel wall thickening, distention, or inflammatory changes. Trace free fluid within the pelvis is nonspecific. No free intraperitoneal gas. Vascular/Lymphatic: No significant vascular findings are present. No enlarged abdominal or pelvic lymph nodes. Reproductive: Uterus and bilateral adnexa are unremarkable. Other: Tiny broad-based fat containing umbilical hernia. Rectum unremarkable. Musculoskeletal: No acute bone abnormality. No lytic or blastic bone lesion. Degenerative changes are seen within the lumbar spine. Review of the MIP images confirms the above findings. IMPRESSION: No pulmonary embolism. No acute intrathoracic or intra-abdominal pathology identified. Mild hepatic steatosis and progressive mild hepatomegaly. Trace free fluid within the pelvis, nonspecific. Electronically Signed   By: Helyn Numbers M.D.   On: 02/01/2021 20:50   CT ABDOMEN PELVIS W CONTRAST  Result Date: 02/01/2021 CLINICAL DATA:  PE suspected, high prob; Flank pain, kidney stone suspected LUQ abdominal pain. Chest pain, back pain, diarrhea, and bright red blood per rectum. EXAM: CT ANGIOGRAPHY CHEST CT ABDOMEN AND PELVIS WITH CONTRAST TECHNIQUE: Multidetector CT imaging of the chest was performed using the standard protocol during bolus administration of intravenous contrast. Multiplanar CT image reconstructions and MIPs were obtained to evaluate the vascular anatomy. Multidetector CT imaging of the abdomen and pelvis was performed using the standard protocol during bolus administration of intravenous contrast. CONTRAST:  58mL OMNIPAQUE IOHEXOL 350 MG/ML SOLN COMPARISON:  02/21/2011 FINDINGS: CTA CHEST FINDINGS Cardiovascular: There is adequate opacification of the pulmonary arterial tree. No intraluminal filling defect identified to suggest acute pulmonary embolism. The central  pulmonary arteries are of normal caliber. No significant coronary artery calcification. Global cardiac size within normal limits. No pericardial effusion. The thoracic aorta is unremarkable. Mediastinum/Nodes: No discrete thyroid nodule identified. Stable probable residual thymic tissue within the anterior mediastinum. No pathologic thoracic adenopathy. The esophagus is unremarkable. Small hiatal hernia. Lungs/Pleura: Lungs are clear. No pleural effusion or pneumothorax. Musculoskeletal: No acute bone abnormality. No lytic or blastic bone lesion. Review of the MIP images confirms the above findings. CT ABDOMEN and PELVIS FINDINGS Hepatobiliary: Multiple cysts are seen scattered throughout the liver. Mild hepatic steatosis. Mild hepatomegaly has progressed in the interval since prior examination no enhancing intrahepatic mass. No intra or extrahepatic biliary ductal dilation. Gallbladder is unremarkable. Pancreas: Unremarkable Spleen: Unremarkable Adrenals/Urinary Tract: Adrenal glands are unremarkable. Kidneys are normal, without renal calculi, focal lesion, or hydronephrosis. Bladder is unremarkable. Stomach/Bowel: Stomach is within normal limits. Appendix appears normal. No evidence of bowel wall thickening, distention, or inflammatory changes. Trace free fluid within the pelvis is nonspecific. No free intraperitoneal gas. Vascular/Lymphatic: No significant vascular findings are present. No enlarged abdominal or pelvic lymph nodes. Reproductive: Uterus and bilateral adnexa are unremarkable. Other: Tiny broad-based fat containing umbilical hernia. Rectum unremarkable. Musculoskeletal: No acute bone abnormality. No lytic or blastic bone lesion. Degenerative changes are seen within the lumbar spine. Review of the MIP images confirms the above findings. IMPRESSION: No pulmonary embolism. No acute intrathoracic or intra-abdominal pathology identified. Mild hepatic steatosis and progressive mild hepatomegaly. Trace free  fluid within the pelvis, nonspecific. Electronically Signed   By: Helyn Numbers M.D.   On: 02/01/2021 20:50    Procedures Procedures   Medications Ordered in ED Medications  lactated ringers bolus 1,000 mL (0 mLs Intravenous Stopped 02/01/21 2113)  ketorolac (TORADOL) 15 MG/ML injection 15 mg (15 mg Intravenous Given 02/01/21 1942)  methocarbamol (ROBAXIN) tablet 500 mg (500 mg Oral Given 02/01/21 1942)  iohexol (OMNIPAQUE) 350 MG/ML injection 80 mL (80 mLs Intravenous Contrast Given 02/01/21 2026)    ED Course  I  have reviewed the triage vital signs and the nursing notes.  Pertinent labs & imaging results that were available during my care of the patient were reviewed by me and considered in my medical decision making (see chart for details).    MDM Rules/Calculators/A&P                          Patient presents for painful rectal bleeding since last night.  Bleeding has been described as a small amount but persistent.  She denies any recent symptoms of anemia.  On arrival, her vital signs are normal.  On exam, patient has a external hemorrhoid.  Hemorrhoid is hemostatic at this time.  Lab work was obtained to identify possible anemia.  Patient also states that she has had ongoing pain over the past week and a half.  This pain is described as posterior right hip, radiating through her thoracic back and into her chest wall.  On exam, she does have some mild right upper quadrant tenderness.  Medications were given for treatment of her ongoing pain.  CT scan was ordered to identify possible lung and/or intra-abdominal etiology.  EKG showed no evidence of ischemia.  Given the chronic nature of the pain, do not suspect ACS.  Lab work was notable for mildly elevated ALT.  Patient's hemoglobin is baseline.  Platelets and leukocyte counts were normal.  CT scan showed no acute findings.  There was incidental findings of hepatomegaly, hepatic steatosis, and multiple hepatic cysts.  Patient has not had any  recent imaging for comparison.  Hepatic findings chronic and unlikely to be related to her symptoms, although, increased stretch on liver capsule could be causing her symptoms of pain over the past 10 days.  Patient was advised to utilize sitz bath's and fiber supplements for management of her hemorrhoid.  She was encouraged to return to the ED if she has any new or worsening symptoms.  She was discharged in stable condition.  Final Clinical Impression(s) / ED Diagnoses Final diagnoses:  Thoracic back pain, unspecified back pain laterality, unspecified chronicity  Hemorrhoids, unspecified hemorrhoid type    Rx / DC Orders ED Discharge Orders          Ordered    methocarbamol (ROBAXIN) 500 MG tablet  2 times daily PRN        02/01/21 2107             Gloris Manchester, MD 02/03/21 1316

## 2021-02-01 NOTE — Discharge Instructions (Signed)
Utilize sitz bath's to minimize symptoms from hemorrhoid.  Take daily psyllium husk for bulking and softening of stool.  Continue to treat pain with NSAIDs and Tylenol.  Additionally, there is a prescription for muscle relaxer to be taken as needed.  Follow-up with your primary care doctor when possible.

## 2021-02-01 NOTE — ED Triage Notes (Signed)
Rectal bleeding, diarrhea and back radiating to her abdominal area for 2 weeks.

## 2021-02-07 ENCOUNTER — Encounter: Payer: Self-pay | Admitting: Physician Assistant

## 2021-02-08 NOTE — Telephone Encounter (Signed)
Totally fine by me! She's very pleasant. If it's a while before she can formally est, she can see me in interim in office if she needs to.  Thank you  Rich

## 2021-02-08 NOTE — Telephone Encounter (Signed)
Patient is requesting TOC, okay for patient to come to [facility]?

## 2021-02-11 NOTE — Telephone Encounter (Signed)
LVM for patient to call back to get scheduled

## 2021-02-22 ENCOUNTER — Other Ambulatory Visit: Payer: Self-pay | Admitting: Registered Nurse

## 2021-02-22 DIAGNOSIS — F411 Generalized anxiety disorder: Secondary | ICD-10-CM

## 2021-02-22 DIAGNOSIS — F3342 Major depressive disorder, recurrent, in full remission: Secondary | ICD-10-CM

## 2021-02-25 ENCOUNTER — Telehealth: Payer: Self-pay

## 2021-02-25 NOTE — Telephone Encounter (Signed)
Patient is calling in wanting to transfer care to [facility], states she is more comfortable with a female.

## 2021-02-25 NOTE — Telephone Encounter (Signed)
She's very pleasant. Happy to have her go where she's comfortable!  Thank you,  Rich

## 2021-02-26 NOTE — Telephone Encounter (Signed)
Patient is scheduled   

## 2021-02-26 NOTE — Telephone Encounter (Signed)
Unable to LVM, will try call again.

## 2021-03-05 ENCOUNTER — Ambulatory Visit: Payer: BC Managed Care – PPO | Admitting: Physician Assistant

## 2021-03-05 ENCOUNTER — Encounter: Payer: Self-pay | Admitting: Physician Assistant

## 2021-03-05 ENCOUNTER — Other Ambulatory Visit: Payer: Self-pay

## 2021-03-05 VITALS — BP 110/64 | HR 75 | Temp 97.5°F | Ht 63.0 in | Wt 199.8 lb

## 2021-03-05 DIAGNOSIS — F3342 Major depressive disorder, recurrent, in full remission: Secondary | ICD-10-CM

## 2021-03-05 DIAGNOSIS — K7689 Other specified diseases of liver: Secondary | ICD-10-CM | POA: Diagnosis not present

## 2021-03-05 DIAGNOSIS — Z23 Encounter for immunization: Secondary | ICD-10-CM | POA: Diagnosis not present

## 2021-03-05 DIAGNOSIS — R748 Abnormal levels of other serum enzymes: Secondary | ICD-10-CM

## 2021-03-05 DIAGNOSIS — K760 Fatty (change of) liver, not elsewhere classified: Secondary | ICD-10-CM

## 2021-03-05 DIAGNOSIS — F411 Generalized anxiety disorder: Secondary | ICD-10-CM

## 2021-03-05 LAB — CBC WITH DIFFERENTIAL/PLATELET
Basophils Absolute: 0 10*3/uL (ref 0.0–0.1)
Basophils Relative: 0.7 % (ref 0.0–3.0)
Eosinophils Absolute: 0.1 10*3/uL (ref 0.0–0.7)
Eosinophils Relative: 1 % (ref 0.0–5.0)
HCT: 39.4 % (ref 36.0–46.0)
Hemoglobin: 12.9 g/dL (ref 12.0–15.0)
Lymphocytes Relative: 32.9 % (ref 12.0–46.0)
Lymphs Abs: 2.2 10*3/uL (ref 0.7–4.0)
MCHC: 32.8 g/dL (ref 30.0–36.0)
MCV: 90.2 fl (ref 78.0–100.0)
Monocytes Absolute: 0.4 10*3/uL (ref 0.1–1.0)
Monocytes Relative: 5.7 % (ref 3.0–12.0)
Neutro Abs: 3.9 10*3/uL (ref 1.4–7.7)
Neutrophils Relative %: 59.7 % (ref 43.0–77.0)
Platelets: 259 10*3/uL (ref 150.0–400.0)
RBC: 4.36 Mil/uL (ref 3.87–5.11)
RDW: 14.6 % (ref 11.5–15.5)
WBC: 6.5 10*3/uL (ref 4.0–10.5)

## 2021-03-05 LAB — COMPREHENSIVE METABOLIC PANEL
ALT: 35 U/L (ref 0–35)
AST: 24 U/L (ref 0–37)
Albumin: 4.4 g/dL (ref 3.5–5.2)
Alkaline Phosphatase: 90 U/L (ref 39–117)
BUN: 13 mg/dL (ref 6–23)
CO2: 27 mEq/L (ref 19–32)
Calcium: 9.2 mg/dL (ref 8.4–10.5)
Chloride: 104 mEq/L (ref 96–112)
Creatinine, Ser: 0.7 mg/dL (ref 0.40–1.20)
GFR: 94.76 mL/min (ref >60.00)
Glucose, Bld: 101 mg/dL — ABNORMAL HIGH (ref 70–99)
Potassium: 4 mEq/L (ref 3.5–5.1)
Sodium: 138 mEq/L (ref 135–145)
Total Bilirubin: 0.5 mg/dL (ref 0.2–1.2)
Total Protein: 7.1 g/dL (ref 6.0–8.3)

## 2021-03-05 NOTE — Progress Notes (Signed)
Subjective:    Patient ID: Sandra Mills, female    DOB: June 06, 1960, 60 y.o.   MRN: [id number]  Chief Complaint  Patient presents with   Transitions Of Care    HPI 60 y.o. patient presents today for new patient establishment with me. She was previously established with Janeece Agee, NP.   Current Care Team: Dr. Hyacinth Meeker, GYN   Acute Concerns: -Labs and CT results from ED visit on 02/01/21 -She has not had any alcohol or Tylenol since ED visit.  -Paternal uncle passed away from non-alcoholic cirrhosis around age 55 -Celestial herbal teas throughout the day, more than water per patient.   Chronic Concerns: See PMH listed below, as well as A/P for details on issues we specifically discussed during today's visit.     Past Medical History:  Diagnosis Date   Anxiety    Colon polyps 12/09/12   three polyps; [facility]; repeat in 3-5 years.   Depression    IBS (irritable bowel syndrome)    diarrhea predominant; s/p GI consult with colonoscopy   Migraine    menstrually related.   Tremor, essential     Past Surgical History:  Procedure Laterality Date   COLONOSCOPY W/ POLYPECTOMY  12/09/12   three polyps; [place]. Repeat in 3-5 years.   Cyst resection perineal     removed cyst from periniel area      Family History  Problem Relation Age of Onset   Breast cancer Mother 46   Atrial fibrillation Father    Atrial fibrillation Sister    Breast cancer Other        maternal great aunt dx in her 65s   Breast cancer Other        6 maternal great aunts dx in their 50s    Social History   Tobacco Use   Smoking status: Never   Smokeless tobacco: Never  Vaping Use   Vaping Use: Never used  Substance Use Topics   Alcohol use: Yes    Comment: rarely   Drug use: No     No Known Allergies  Review of Systems REFER TO HPI FOR PERTINENT POSITIVES AND NEGATIVES      Objective:     BP 110/64   Pulse 75   Temp (!) 97.5 F (36.4 C) (Temporal)   Ht 5\' 3"  (1.6 m)   Wt 199  lb 12.8 oz (90.6 kg)   SpO2 98%   BMI 35.39 kg/m   Wt Readings from Last 3 Encounters:  03/05/21 199 lb 12.8 oz (90.6 kg)  02/01/21 200 lb (90.7 kg)  02/01/21 201 lb 4 oz (91.3 kg)    BP Readings from Last 3 Encounters:  03/05/21 110/64  02/01/21 126/72  02/01/21 124/79     Physical Exam Vitals and nursing note reviewed.  Constitutional:      Appearance: Normal appearance. She is obese. She is not toxic-appearing.  HENT:     Head: Normocephalic and atraumatic.     Right Ear: Tympanic membrane, ear canal and external ear normal.     Left Ear: Tympanic membrane, ear canal and external ear normal.     Nose: Nose normal.     Mouth/Throat:     Mouth: Mucous membranes are moist.  Eyes:     Extraocular Movements: Extraocular movements intact.     Conjunctiva/sclera: Conjunctivae normal.     Pupils: Pupils are equal, round, and reactive to light.  Cardiovascular:     Rate and Rhythm: Normal rate and regular  rhythm.     Pulses: Normal pulses.     Heart sounds: Normal heart sounds.  Pulmonary:     Effort: Pulmonary effort is normal.     Breath sounds: Normal breath sounds.  Abdominal:     General: Abdomen is flat. Bowel sounds are normal.     Palpations: Abdomen is soft.  Musculoskeletal:        General: Normal range of motion.     Cervical back: Normal range of motion and neck supple.  Skin:    General: Skin is warm and dry.  Neurological:     General: No focal deficit present.     Mental Status: She is alert and oriented to person, place, and time.  Psychiatric:        Mood and Affect: Mood normal.        Behavior: Behavior normal.        Thought Content: Thought content normal.        Judgment: Judgment normal.       Assessment & Plan:   Problem List Items Addressed This Visit       Other   Recurrent major depressive disorder, in full remission (HCC)   Generalized anxiety disorder   Other Visit Diagnoses     Elevated liver enzymes    -  Primary    Relevant Orders   CBC with Differential/Platelet (Completed)   Comprehensive metabolic panel (Completed)   Liver cyst       Relevant Orders   CBC with Differential/Platelet (Completed)   Comprehensive metabolic panel (Completed)   Hepatic steatosis       Relevant Orders   CBC with Differential/Platelet (Completed)   Comprehensive metabolic panel (Completed)   Need for vaccination       Relevant Orders   Flu Vaccine QUAD 6+ mos PF IM (Fluarix Quad PF) (Completed)        1. Elevated liver enzymes 2. Liver cyst 3. Hepatic steatosis -Repeat CMP today -CT abd/pelvis results from 02/01/21:  Hepatobiliary: Multiple cysts are seen scattered throughout the liver. Mild hepatic steatosis. Mild hepatomegaly has progressed in the interval since prior examination no enhancing intrahepatic mass. No intra or extrahepatic biliary ductal dilation. Gallbladder is unremarkable. -She would like to f/up with GI on this. Referral placed today. -Encouraged her to continue her discontinuation of alcohol and Tylenol. Should also cut out herbal teas and just try water.   4. Need for vaccination Flu vaccine today   5. Recurrent major depressive disorder, in full remission (HCC) 6. Generalized anxiety disorder -Currently stable on Neurontin 100 mg daily and Zoloft 50 mg daily. No refills needed today.    This note was prepared with assistance of Conservation officer, historic buildings. Occasional wrong-word or sound-a-like substitutions may have occurred due to the inherent limitations of voice recognition software.  Time Spent: 26 minutes of total time was spent on the date of the encounter performing the following actions: chart review prior to seeing the patient, obtaining history, performing a medically necessary exam, counseling on the treatment plan, placing orders, and documenting in our EHR.    Harjit Leider M Amairani Shuey, PA-C

## 2021-03-05 NOTE — Patient Instructions (Signed)
Good to see you! Please go to the lab and I will call with results. Stop all alcohol, Tylenol, and teas.  F/up with me in 4-6 weeks to talk about weight loss plan. Start thinking of ideas / goals now.

## 2021-04-02 ENCOUNTER — Ambulatory Visit: Payer: BC Managed Care – PPO | Admitting: Physician Assistant

## 2021-04-15 ENCOUNTER — Encounter: Payer: Self-pay | Admitting: Gastroenterology

## 2021-04-15 ENCOUNTER — Ambulatory Visit (INDEPENDENT_AMBULATORY_CARE_PROVIDER_SITE_OTHER): Payer: BC Managed Care – PPO | Admitting: Gastroenterology

## 2021-04-15 ENCOUNTER — Other Ambulatory Visit (INDEPENDENT_AMBULATORY_CARE_PROVIDER_SITE_OTHER): Payer: BC Managed Care – PPO

## 2021-04-15 VITALS — BP 118/70 | HR 67 | Ht 63.0 in | Wt 203.0 lb

## 2021-04-15 DIAGNOSIS — K760 Fatty (change of) liver, not elsewhere classified: Secondary | ICD-10-CM | POA: Diagnosis not present

## 2021-04-15 DIAGNOSIS — R197 Diarrhea, unspecified: Secondary | ICD-10-CM

## 2021-04-15 DIAGNOSIS — K7689 Other specified diseases of liver: Secondary | ICD-10-CM | POA: Diagnosis not present

## 2021-04-15 DIAGNOSIS — Z1211 Encounter for screening for malignant neoplasm of colon: Secondary | ICD-10-CM

## 2021-04-15 LAB — GAMMA GT: GGT: 19 U/L (ref 7–51)

## 2021-04-15 NOTE — Patient Instructions (Signed)
If you are age 59 or older, your body mass index should be between 23-30. Your Body mass index is 35.96 kg/m. If this is out of the aforementioned range listed, please consider follow up with your Primary Care Provider.  If you are age 109 or younger, your body mass index should be between 19-25. Your Body mass index is 35.96 kg/m. If this is out of the aformentioned range listed, please consider follow up with your Primary Care Provider.   ________________________________________________________ Your provider has requested that you go to the basement level for lab work before leaving today. Press "B" on the elevator. The lab is located at the first door on the left as you exit the elevator.   The [facility] providers would like to encourage you to use [facility] to communicate with providers for non-urgent requests or questions.  Due to long hold times on the telephone, sending your provider a message by [facility] may be a faster and more efficient way to get a response.  Please allow 48 business hours for a response.  Please remember that this is for non-urgent requests.   You have been scheduled for an abdominal ultrasound with elastography at [facility] (1st floor). Your appointment is scheduled for 04/17/21 at 9:30am. Please arrive 15 minutes prior to your scheduled appointment for registration purposes. Make certain not to have anything to eat or drink 6 hours prior to your procedure. Should you need to reschedule your appointment, you may contact radiology at [phone].  Liver Elastography Various chronic liver diseases such as hepatitis B, C, and fatty liver disease can lead to tissue damage and subsequent scar tissue formation. As the scar tissue accumulates, the liver loses some of its elasticity and becomes stiffer. Liver elastography involves the use of a surface ultrasound probe that delivers a low frequency pulse or shear wave to a small volume of liver tissue under the rib  cage. The transmission of the sound wave is completely painless. How Is a Liver Elastography Performed? The liver is located in the right upper abdomen under the rib cage. Patients are asked to lie flat on an examination table. A technician places the FibroScan probe between the ribs on the right side of the lower chest wall. A series of 10 painless pulses are then applied to the liver. The results are recorded on the equipment and an overall liver stiffness score is generated. This score is then interpreted by a qualified physician to predict the likelihood of advanced fibrosis or cirrhosis.  Patients are asked to wear loose clothing and should not consume any liquids or solids for a minimum of 4 hours before the test to increase the likelihood of obtaining reliable test results. The scan will take 10 to 15 minutes to complete, but patients should plan on being available for 30 minutes to allow time for preparation

## 2021-04-15 NOTE — Progress Notes (Signed)
HPI : Sandra Mills is a very pleasant 60 year old female with a history of depression, anxiety, obesity and IBS who is referred to [place] by Alyssa Allwardt for further evaluation of fatty liver and hepatic cysts found incidentally on CT.  The patient had gone to the ED in early October because of symptoms of abdominal and back pain which she thinks was likely musculoskeletal in origin.  That pain has since resolved, but a CT was performed and showed diffuse mild hepatic steatosis and mild hepatomegaly, as well as multiple hepatic cysts.  The patient is noted to have chronically elevated aminotransferases going to back to 2018, ALT predominant.  She had a negative HCV anitbody, but otherwise has had no further evaluation for chronic liver disease.  She is obese but has no other NAFLD risk factors.  She has historically drank at most 1-2 glasses of wine per week, but since the CT showed fatty liver she has not drank at all.  She reports having an uncle that died from liver disease in his 62s, and he was not a drinker.  She has gained 20lbs in the past 1-2 years which she attributes to menopause and diet/activity changes that started with the [place] pandemic. She has a long history of frequent loose stools with urgency, sometimes with crampy abdominal pain.  Usually has 1-3 bowel movements per day.  She has been taking fiber gummies without improvement in her stool consistency.  She drinks diet sodas frequently. She had a colonoscopy in 2014 and had three small polyps removed, but thinks she was recommended to repeat in 10 years (colonoscopy report available, but pathology results are not available.     Past Medical History:  Diagnosis Date   Anxiety    Colon polyps 12/09/2012   three polyps; [place]; repeat in 3-5 years.   Depression    IBS (irritable bowel syndrome)    diarrhea predominant; s/p GI consult with colonoscopy   Migraine    menstrually related.   Obesity    Tremor, essential      Past  Surgical History:  Procedure Laterality Date   COLONOSCOPY W/ POLYPECTOMY  12/09/2012   three polyps; [place]. Repeat in 3-5 years.   Cyst resection perineal     Family History  Problem Relation Age of Onset   Breast cancer Mother 66       spread   Atrial fibrillation Father    Atrial fibrillation Sister    Breast cancer Other        maternal great aunt dx in her 80s   Breast cancer Other        6 maternal great aunts dx in their 82s   Colon cancer Neg Hx    Esophageal cancer Neg Hx    Social History   Tobacco Use   Smoking status: Never   Smokeless tobacco: Never  Vaping Use   Vaping Use: Never used  Substance Use Topics   Alcohol use: Yes    Comment: rarely   Drug use: No   Current Outpatient Medications  Medication Sig Dispense Refill   cetirizine (ZYRTEC) 10 MG tablet Take 1 tablet (10 mg total) by mouth daily. 30 tablet 11   fluticasone (FLONASE) 50 MCG/ACT nasal spray PLACE 1 SPRAY INTO BOTH NOSTRILS 2 (TWO) TIMES DAILY. 48 mL 2   gabapentin (NEURONTIN) 100 MG capsule TAKE 2 CAPSULES BY MOUTH TWICE A DAY 360 capsule 0   sertraline (ZOLOFT) 50 MG tablet TAKE 3 TABLETS BY MOUTH  EVERY DAY 270 tablet 3   No current facility-administered medications for this visit.   No Known Allergies   Review of Systems: All systems reviewed and negative except where noted in HPI.    No results found.  Physical Exam: BP 118/70    Pulse 67    Ht 5\' 3"  (1.6 m)    Wt 203 lb (92.1 kg)    BMI 35.96 kg/m  Constitutional: Pleasant,well-developed, Caucasian female in no acute distress.  Accompanied by husband HEENT: Normocephalic and atraumatic. Conjunctivae are normal. No scleral icterus. Cardiovascular: Normal rate, regular rhythm.  Pulmonary/chest: Effort normal and breath sounds normal. No wheezing, rales or rhonchi. Abdominal: Soft, nondistended, nontender. Bowel sounds active throughout. There are no masses palpable. No hepatomegaly. Extremities: no edema Neurological: Alert  and oriented to person place and time. Skin: Skin is warm and dry. No rashes noted. Psychiatric: Normal mood and affect. Behavior is normal.  CT CLINICAL DATA:  PE suspected, high prob; Flank pain, kidney stone suspected LUQ abdominal pain. Chest pain, back pain, diarrhea, and bright red blood per rectum.   EXAM: CT ANGIOGRAPHY CHEST   CT ABDOMEN AND PELVIS WITH CONTRAST   TECHNIQUE: Multidetector CT imaging of the chest was performed using the standard protocol during bolus administration of intravenous contrast. Multiplanar CT image reconstructions and MIPs were obtained to evaluate the vascular anatomy. Multidetector CT imaging of the abdomen and pelvis was performed using the standard protocol during bolus administration of intravenous contrast.   CONTRAST:  69mL OMNIPAQUE IOHEXOL 350 MG/ML SOLN   COMPARISON:  02/21/2011   FINDINGS: CTA CHEST FINDINGS   Cardiovascular: There is adequate opacification of the pulmonary arterial tree. No intraluminal filling defect identified to suggest acute pulmonary embolism. The central pulmonary arteries are of normal caliber. No significant coronary artery calcification. Global cardiac size within normal limits. No pericardial effusion. The thoracic aorta is unremarkable.   Mediastinum/Nodes: No discrete thyroid nodule identified. Stable probable residual thymic tissue within the anterior mediastinum. No pathologic thoracic adenopathy. The esophagus is unremarkable. Small hiatal hernia.   Lungs/Pleura: Lungs are clear. No pleural effusion or pneumothorax.   Musculoskeletal: No acute bone abnormality. No lytic or blastic bone lesion.   Review of the MIP images confirms the above findings.   CT ABDOMEN and PELVIS FINDINGS   Hepatobiliary: Multiple cysts are seen scattered throughout the liver. Mild hepatic steatosis. Mild hepatomegaly has progressed in the interval since prior examination no enhancing intrahepatic mass. No  intra or extrahepatic biliary ductal dilation. Gallbladder is unremarkable.   Pancreas: Unremarkable   Spleen: Unremarkable   Adrenals/Urinary Tract: Adrenal glands are unremarkable. Kidneys are normal, without renal calculi, focal lesion, or hydronephrosis. Bladder is unremarkable.   Stomach/Bowel: Stomach is within normal limits. Appendix appears normal. No evidence of bowel wall thickening, distention, or inflammatory changes. Trace free fluid within the pelvis is nonspecific. No free intraperitoneal gas.   Vascular/Lymphatic: No significant vascular findings are present. No enlarged abdominal or pelvic lymph nodes.   Reproductive: Uterus and bilateral adnexa are unremarkable.   Other: Tiny broad-based fat containing umbilical hernia. Rectum unremarkable.   Musculoskeletal: No acute bone abnormality. No lytic or blastic bone lesion. Degenerative changes are seen within the lumbar spine.   Review of the MIP images confirms the above findings.   IMPRESSION: No pulmonary embolism. No acute intrathoracic or intra-abdominal pathology identified.   Mild hepatic steatosis and progressive mild hepatomegaly.   Trace free fluid within the pelvis, nonspecific.     Electronically Signed  By: Fidela Salisbury M.D.   On: 02/01/2021 20:50      CBC    Component Value Date/Time   WBC 6.5 03/05/2021 1424   RBC 4.36 03/05/2021 1424   HGB 12.9 03/05/2021 1424   HGB 13.7 04/09/2020 1648   HCT 39.4 03/05/2021 1424   HCT 41.2 04/09/2020 1648   PLT 259.0 03/05/2021 1424   PLT 302 04/09/2020 1648   MCV 90.2 03/05/2021 1424   MCV 90 04/09/2020 1648   MCH 29.4 02/01/2021 1901   MCHC 32.8 03/05/2021 1424   RDW 14.6 03/05/2021 1424   RDW 13.0 04/09/2020 1648   LYMPHSABS 2.2 03/05/2021 1424   MONOABS 0.4 03/05/2021 1424   EOSABS 0.1 03/05/2021 1424   BASOSABS 0.0 03/05/2021 1424    CMP     Component Value Date/Time   NA 138 03/05/2021 1424   NA 140 04/09/2020 1648   K  4.0 03/05/2021 1424   CL 104 03/05/2021 1424   CO2 27 03/05/2021 1424   GLUCOSE 101 (H) 03/05/2021 1424   BUN 13 03/05/2021 1424   BUN 13 04/09/2020 1648   CREATININE 0.70 03/05/2021 1424   CREATININE 0.73 01/08/2015 1406   CALCIUM 9.2 03/05/2021 1424   PROT 7.1 03/05/2021 1424   PROT 7.8 04/09/2020 1648   ALBUMIN 4.4 03/05/2021 1424   ALBUMIN 4.8 04/09/2020 1648   AST 24 03/05/2021 1424   ALT 35 03/05/2021 1424   ALKPHOS 90 03/05/2021 1424   BILITOT 0.5 03/05/2021 1424   BILITOT 0.3 04/09/2020 1648   GFRNONAA >60 02/01/2021 1901   GFRAA 107 04/09/2020 1648     ASSESSMENT AND PLAN: 60 year old female with obesity and chronically elevated ALT, an enlarged fatty liver on CT and family history of liver disease (uncle, cirrhosis unknown etiology).  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  Will get Fibroscan to assess for liver stiffness/scarring.  We discussed the pathophysiology of nonalcoholic liver disease, the potential for progression to cirrhosis and the principles of disease management which center around weight through diet and exercise.  She was interested in meeting with a nutritionist to help her improve her diet.  I stressed the emerging role of highly processed carbohydrates/sugars in fatty liver disease and suggested she focus on reducing these foods in her diet.  We discussed the positive role of black coffee on liver health and suggested she liberalize coffee consumption (as long as no cream/sugar). With regards to her diarrhea, I suggested she stop drinking diet sodas and other foods with artificial sweeteners for a few weeks and start taking Metamucil powder and see if her stool bulk improves. Will get pathology report from Dr. Ulyses Amor office to see if patient is due for her next colonoscopy.   Fatty liver/elevated ALT, presumed NAFLD -  Fibroscan  - Exclude viral, metabolic/hereditary, autoimmune causes of chronic liver disease - Nutrition referral   Chronic diarrhea - Metamucil - Eliminate artificial sweeteners  Colon cancer screening - Obtain pathology report from 2014 colonoscopy  Hepatic cysts - No further follow up or evaluation for simple liver cysts  Avannah Decker E. Candis Schatz, MD [facility]   Allwardt, Randa Evens, PA-C

## 2021-04-16 ENCOUNTER — Other Ambulatory Visit: Payer: Self-pay

## 2021-04-16 ENCOUNTER — Ambulatory Visit: Payer: BC Managed Care – PPO | Admitting: Physician Assistant

## 2021-04-16 ENCOUNTER — Encounter: Payer: Self-pay | Admitting: Physician Assistant

## 2021-04-16 ENCOUNTER — Other Ambulatory Visit (HOSPITAL_COMMUNITY): Payer: Self-pay

## 2021-04-16 ENCOUNTER — Other Ambulatory Visit: Payer: Self-pay | Admitting: Physician Assistant

## 2021-04-16 VITALS — BP 111/74 | HR 74 | Temp 98.1°F | Ht 63.0 in | Wt 203.5 lb

## 2021-04-16 DIAGNOSIS — K760 Fatty (change of) liver, not elsewhere classified: Secondary | ICD-10-CM | POA: Diagnosis not present

## 2021-04-16 DIAGNOSIS — E6609 Other obesity due to excess calories: Secondary | ICD-10-CM

## 2021-04-16 DIAGNOSIS — R739 Hyperglycemia, unspecified: Secondary | ICD-10-CM | POA: Diagnosis not present

## 2021-04-16 DIAGNOSIS — E8881 Metabolic syndrome: Secondary | ICD-10-CM | POA: Diagnosis not present

## 2021-04-16 DIAGNOSIS — Z6836 Body mass index (BMI) 36.0-36.9, adult: Secondary | ICD-10-CM | POA: Diagnosis not present

## 2021-04-16 MED ORDER — TIRZEPATIDE 2.5 MG/0.5ML ~~LOC~~ SOAJ
2.5000 mg | SUBCUTANEOUS | 0 refills | Status: DC
  Filled 2021-04-16: qty 2, 28d supply, fill #0

## 2021-04-16 MED ORDER — TIRZEPATIDE 2.5 MG/0.5ML ~~LOC~~ SOAJ: 2.5000 mg | SUBCUTANEOUS | 0 refills | Status: DC

## 2021-04-16 NOTE — Patient Instructions (Signed)
Good to see you today! Start on the once weekly Mounjaro and f/up with me in about 6 weeks.  MyChart / call if any concerns come up.  I'm excited to have you see the nutritionist for additional support. See handout provided as well for helpful tips.  Try to walk at least 3-4 times per week.

## 2021-04-16 NOTE — Progress Notes (Signed)
Subjective:    Patient ID: Sandra Mills, female    DOB: 05/25/1960, 60 y.o.   MRN: [id number]  Chief Complaint  Patient presents with   Weight Check    HPI Patient is in today for discussion about weight loss.   History of weight loss attempts:  -Weight watchers -[employer]  -[place] Weight Loss -[employer] - most recent, lost 40 lbs, but gained this back plus more, very fast process per patient  Used to really enjoy running, but now has issues with her knees and cannot do this anymore.   Current work schedule has her getting home late. Leaves house by 8 am (says she's not a morning person), not getting home til 6 or 7 pm.   Eating pre-made salads. She has cut back on sodas. Drinks green-juice and sparkling waters.    Recent diagnosis of fatty liver and has seen GI. She has referral to nutritionist she will see in February next year as well.   Past Medical History:  Diagnosis Date   Anxiety    Colon polyps 12/09/2012   three polyps; [place]; repeat in 3-5 years.   Depression    IBS (irritable bowel syndrome)    diarrhea predominant; s/p GI consult with colonoscopy   Migraine    menstrually related.   Obesity    Tremor, essential     Past Surgical History:  Procedure Laterality Date   COLONOSCOPY W/ POLYPECTOMY  12/09/2012   three polyps; [place]. Repeat in 3-5 years.   Cyst resection perineal      Family History  Problem Relation Age of Onset   Breast cancer Mother 80       spread   Atrial fibrillation Father    Atrial fibrillation Sister    Breast cancer Other        maternal great aunt dx in her 73s   Breast cancer Other        6 maternal great aunts dx in their 25s   Colon cancer Neg Hx    Esophageal cancer Neg Hx     Social History   Tobacco Use   Smoking status: Never   Smokeless tobacco: Never  Vaping Use   Vaping Use: Never used  Substance Use Topics   Alcohol use: Yes    Comment: rarely   Drug use: No     No Known Allergies  Review of  Systems NEGATIVE UNLESS OTHERWISE INDICATED IN HPI      Objective:     BP 111/74    Pulse 74    Temp 98.1 F (36.7 C)    Ht 5\' 3"  (1.6 m)    Wt 203 lb 8 oz (92.3 kg)    SpO2 97%    BMI 36.05 kg/m   Wt Readings from Last 3 Encounters:  04/16/21 203 lb 8 oz (92.3 kg)  04/15/21 203 lb (92.1 kg)  03/05/21 199 lb 12.8 oz (90.6 kg)    BP Readings from Last 3 Encounters:  04/16/21 111/74  04/15/21 118/70  03/05/21 110/64     Physical Exam Vitals and nursing note reviewed.  Constitutional:      Appearance: Normal appearance. She is obese. She is not toxic-appearing.  HENT:     Head: Normocephalic and atraumatic.     Right Ear: External ear normal.     Left Ear: External ear normal.     Nose: Nose normal.     Mouth/Throat:     Mouth: Mucous membranes are moist.  Eyes:  Extraocular Movements: Extraocular movements intact.     Conjunctiva/sclera: Conjunctivae normal.     Pupils: Pupils are equal, round, and reactive to light.  Cardiovascular:     Rate and Rhythm: Normal rate and regular rhythm.     Pulses: Normal pulses.     Heart sounds: Normal heart sounds.  Pulmonary:     Effort: Pulmonary effort is normal.     Breath sounds: Normal breath sounds.  Musculoskeletal:        General: Normal range of motion.     Cervical back: Normal range of motion and neck supple.  Skin:    General: Skin is warm and dry.  Neurological:     General: No focal deficit present.     Mental Status: She is alert and oriented to person, place, and time.  Psychiatric:        Mood and Affect: Mood normal.        Behavior: Behavior normal.        Thought Content: Thought content normal.        Judgment: Judgment normal.       Assessment & Plan:   Problem List Items Addressed This Visit   None Visit Diagnoses     Class 2 obesity due to excess calories without serious comorbidity with body mass index (BMI) of 36.0 to 36.9 in adult    -  Primary   Insulin resistance        Hyperglycemia           1. Class 2 obesity due to excess calories without serious comorbidity with body mass index (BMI) of 36.0 to 36.9 in adult 2. Insulin resistance 3. Hyperglycemia 4. Fatty liver -Good discussion with patient today about injectable vs oral weight loss medication options -She will benefit from once weekly injectable Mounjaro; sample for the first 4 weeks provided to her in office today -Possible side effects discussed -F/up with me in 4-6 weeks -Stress importance of adjunctive lifestyle habits - exercise, nutrition!    This note was prepared with assistance of Conservation officer, historic buildings. Occasional wrong-word or sound-a-like substitutions may have occurred due to the inherent limitations of voice recognition software.  Time Spent: 35 minutes of total time was spent on the date of the encounter performing the following actions: chart review prior to seeing the patient, obtaining history, performing a medically necessary exam, counseling on the treatment plan, placing orders, and documenting in our EHR.    Vega Stare M Donnelle Olmeda, PA-C

## 2021-04-17 ENCOUNTER — Other Ambulatory Visit (HOSPITAL_COMMUNITY): Payer: Self-pay

## 2021-04-17 ENCOUNTER — Encounter: Payer: Self-pay | Admitting: Gastroenterology

## 2021-04-17 ENCOUNTER — Encounter (HOSPITAL_COMMUNITY): Payer: Self-pay

## 2021-04-17 ENCOUNTER — Ambulatory Visit (HOSPITAL_COMMUNITY)
Admission: RE | Admit: 2021-04-17 | Discharge: 2021-04-17 | Disposition: A | Discharge status: Home / Self Care | Payer: BC Managed Care – PPO | Source: Ambulatory Visit | Attending: Gastroenterology | Admitting: Gastroenterology

## 2021-04-17 DIAGNOSIS — K760 Fatty (change of) liver, not elsewhere classified: Secondary | ICD-10-CM

## 2021-04-18 LAB — IGA: Immunoglobulin A: 209 mg/dL (ref 47–310)

## 2021-04-18 LAB — TISSUE TRANSGLUTAMINASE, IGA: (tTG) Ab, IgA: <1 U/mL

## 2021-04-18 LAB — ANTI-SMOOTH MUSCLE ANTIBODY, IGG: Actin (Smooth Muscle) Antibody (IGG): <20 U (ref <20)

## 2021-04-18 LAB — ALPHA-1-ANTITRYPSIN: A-1 Antitrypsin, Ser: 157 mg/dL (ref 83–199)

## 2021-04-18 LAB — IGG: IgG (Immunoglobin G), Serum: 1077 mg/dL (ref 600–1640)

## 2021-04-18 LAB — MITOCHONDRIAL ANTIBODIES: Mitochondrial M2 Ab, IgG: 57.3 U — ABNORMAL HIGH (ref <=20.0)

## 2021-04-18 LAB — CERULOPLASMIN: Ceruloplasmin: 30 mg/dL (ref 18–53)

## 2021-04-18 LAB — HEPATITIS B SURFACE ANTIBODY,QUALITATIVE: Hep B S Ab: REACTIVE — AB

## 2021-04-18 LAB — HEPATITIS B SURFACE ANTIGEN: Hepatitis B Surface Ag: NONREACTIVE

## 2021-04-24 ENCOUNTER — Ambulatory Visit (HOSPITAL_COMMUNITY): Admission: RE | Admit: 2021-04-24 | Payer: BC Managed Care – PPO | Source: Ambulatory Visit

## 2021-04-25 ENCOUNTER — Other Ambulatory Visit: Payer: Self-pay

## 2021-04-25 ENCOUNTER — Ambulatory Visit (HOSPITAL_COMMUNITY)
Admission: RE | Admit: 2021-04-25 | Discharge: 2021-04-25 | Disposition: A | Discharge status: Home / Self Care | Payer: BC Managed Care – PPO | Source: Ambulatory Visit | Attending: Gastroenterology | Admitting: Gastroenterology

## 2021-04-25 DIAGNOSIS — K760 Fatty (change of) liver, not elsewhere classified: Secondary | ICD-10-CM | POA: Diagnosis not present

## 2021-05-14 ENCOUNTER — Ambulatory Visit: Payer: BC Managed Care – PPO | Admitting: Physician Assistant

## 2021-05-31 ENCOUNTER — Other Ambulatory Visit: Payer: Self-pay | Admitting: Registered Nurse

## 2021-05-31 DIAGNOSIS — F3342 Major depressive disorder, recurrent, in full remission: Secondary | ICD-10-CM

## 2021-06-04 ENCOUNTER — Encounter: Payer: Self-pay | Admitting: Dietician

## 2021-06-04 ENCOUNTER — Encounter: Payer: BC Managed Care – PPO | Attending: Gastroenterology | Admitting: Dietician

## 2021-06-04 ENCOUNTER — Other Ambulatory Visit: Payer: Self-pay

## 2021-06-04 DIAGNOSIS — Z713 Dietary counseling and surveillance: Secondary | ICD-10-CM | POA: Insufficient documentation

## 2021-06-04 DIAGNOSIS — K760 Fatty (change of) liver, not elsewhere classified: Secondary | ICD-10-CM | POA: Diagnosis not present

## 2021-06-04 NOTE — Progress Notes (Signed)
Medical Nutrition Therapy  Appointment Start time:  35  Appointment End time:  1715  Primary concerns today: Liver Health  Referral diagnosis: K76.0 - Fatty Liver Preferred learning style: Visual, Hands on Learning readiness: Change in progress   NUTRITION ASSESSMENT   Anthropometrics  None taken   Clinical Medical Hx: Anxiety, Depression Medications: Mounjarno, Gabapentin, Sertraline Labs: Glucose - 101, Liver Enzymes are currently in range Notable Signs/Symptoms: N/A  Lifestyle & Dietary Hx Abridged appointment due to limited time. Pt reports starting Mounjarno about 6 weeks ago and has lost about 12 pounds since starting. Pt reports a decrease in appetite, earlier satiety, and dramatic increase in energy level. Pt reports that they used to get home from work and want to lay down, but now they are much more energized and active. Pt reports trying to cut back on carbs and increase vegetables intake. Pt has been consuming Naked brand drinks daily, unaware of the high sugar content.  Estimated daily fluid intake: +64 oz Supplements: N/A Sleep: Sleeps well Stress / self-care: Mild Current average weekly physical activity: ADLs, doing more activities in the evenings now  24-Hr Dietary Recall First Meal: Low fat yogurt, green machine drink Snack: none Second Meal: Salad w/ spinach, chicken, walnuts, dried cranberries, feta cheese, vinegarettte Snack: Orange Third Meal: 2 slices of frozen pizza, pepperoni Snack: sugar-free popsicles Beverages: Green machine, 64 oz water    NUTRITION DIAGNOSIS  NB-1.1 Food and nutrition-related knowledge deficit As related to fatty liver.  As evidenced by dietary history high in concentrated sugars, .   NUTRITION INTERVENTION  Nutrition education (E-1) on the following topics:  Educated pt on the dietary recommendations for fatty liver, including low saturated fat, very low concentrated sugars, 50% or more carbs being whole grains, lean  proteins, and consistent physical activity. Educate pt on factors that can elevate LDL cholesterol, including high dietary intake of saturated fats. Educate pt on identifying sources of saturated fats, and how to make alternative food choices to lower saturated fat intake. Educate pt on the role of soluble fiber in binding to cholesterol in the GI tract an eliminating it from the body. Educate pt on dietary sources of soluble fiber.  Educate pt on the potential dietary causes of hypertriglyceridemia, including concentrated sugars and sugar sweetened beverages. Educated patient on their role in leading to fatty liver.   Handouts Provided Include  NAFLD Dietary Guidelines  Learning Style & Readiness for Change Teaching method utilized: Visual & Auditory  Demonstrated degree of understanding via: Teach Back  Barriers to learning/adherence to lifestyle change: None  Goals Established by Pt Choose sugar free beverages as often as possible. Choose sugar-free green tea, crystal light type powder mixes, and zero sugar sodas if desired. Keep up the 64 oz of water. Continue capitalizing on your increased energy levels and increase your activity level. Work towards eating three meals a day, about 5-6 hours apart! Begin to recognize carbohydrates, proteins, and non-starchy vegetables in your food choices! Begin to build your meals using the proportions of the Balanced Plate. First, select your carb choice(s) for the meal. Make this 25% of your meal. Next, select your source of protein to pair with your carb choice(s). Make this another 25% of your meal. Finally, complete your meal with a variety of non-starchy vegetables. Make this the remaining 50% of your meal. Use a combination of these three strategies to effectively lower your consumption of saturated fat 1) Consume a smaller portion when having animal products 2) Consume  animal products less frequently 3) Find a reduced or low fat version of  animal products   MONITORING & EVALUATION Dietary intake, and weekly physical activity PRN.  Next Steps  Patient is to call [facility] to schedule follow-up.

## 2021-06-04 NOTE — Patient Instructions (Addendum)
Choose sugar free beverages as often as possible. Choose sugar-free green tea, crystal light type powder mixes, and zero sugar sodas if desired. Keep up the 64 oz of water.  Continue capitalizing on your increased energy levels and increase your activity level.  Work towards eating three meals a day, about 5-6 hours apart!  Begin to recognize carbohydrates, proteins, and non-starchy vegetables in your food choices!  Begin to build your meals using the proportions of the Balanced Plate. First, select your carb choice(s) for the meal. Make this 25% of your meal. Next, select your source of protein to pair with your carb choice(s). Make this another 25% of your meal. Finally, complete your meal with a variety of non-starchy vegetables. Make this the remaining 50% of your meal.   Use a combination of these three strategies to effectively lower your consumption of saturated fat 1) Consume a smaller portion when having animal products 2) Consume animal products less frequently 3) Find a reduced or low fat version of animal products

## 2021-06-20 ENCOUNTER — Other Ambulatory Visit (HOSPITAL_COMMUNITY): Payer: Self-pay

## 2021-06-20 ENCOUNTER — Encounter: Payer: Self-pay | Admitting: Physician Assistant

## 2021-06-20 ENCOUNTER — Ambulatory Visit: Payer: BC Managed Care – PPO | Admitting: Physician Assistant

## 2021-06-20 ENCOUNTER — Other Ambulatory Visit: Payer: Self-pay

## 2021-06-20 VITALS — BP 107/75 | HR 82 | Temp 98.3°F | Ht 63.0 in | Wt 189.4 lb

## 2021-06-20 DIAGNOSIS — E782 Mixed hyperlipidemia: Secondary | ICD-10-CM | POA: Diagnosis not present

## 2021-06-20 DIAGNOSIS — Z6833 Body mass index (BMI) 33.0-33.9, adult: Secondary | ICD-10-CM | POA: Diagnosis not present

## 2021-06-20 DIAGNOSIS — E6609 Other obesity due to excess calories: Secondary | ICD-10-CM | POA: Diagnosis not present

## 2021-06-20 MED ORDER — TIRZEPATIDE 5 MG/0.5ML ~~LOC~~ SOAJ
5.0000 mg | SUBCUTANEOUS | 1 refills | Status: DC
  Filled 2021-06-20: qty 6, 84d supply, fill #0

## 2021-06-20 MED ORDER — WEGOVY 0.25 MG/0.5ML ~~LOC~~ SOAJ
0.2500 mg | SUBCUTANEOUS | 0 refills | Status: DC
  Filled 2021-06-20: qty 2, 30d supply, fill #0

## 2021-06-20 NOTE — Progress Notes (Signed)
Subjective:    Patient ID: Sandra Mills, female    DOB: 06-09-1960, 61 y.o.   MRN: [id number]  Chief Complaint  Patient presents with   Weight Check    HPI Patient is in today for weight recheck. Pt has lost 14 lbs since last office visit 04/16/21. She has been taking Mounjaro. No side effects. She has been working with a nutritionist and trying to increase her walking. She has been feeling very good and has no additional concerns at this time.   Past Medical History:  Diagnosis Date   Anxiety    Colon polyps 12/09/2012   three polyps; [facility]; repeat in 3-5 years.   Depression    IBS (irritable bowel syndrome)    diarrhea predominant; s/p GI consult with colonoscopy   Migraine    menstrually related.   Obesity    Tremor, essential     Past Surgical History:  Procedure Laterality Date   COLONOSCOPY W/ POLYPECTOMY  12/09/2012   three polyps; [place]. Repeat in 3-5 years.   Cyst resection perineal      Family History  Problem Relation Age of Onset   Breast cancer Mother 46       spread   Atrial fibrillation Father    Atrial fibrillation Sister    Breast cancer Other        maternal great aunt dx in her 1s   Breast cancer Other        6 maternal great aunts dx in their 2s   Colon cancer Neg Hx    Esophageal cancer Neg Hx     Social History   Tobacco Use   Smoking status: Never   Smokeless tobacco: Never  Vaping Use   Vaping Use: Never used  Substance Use Topics   Alcohol use: Yes    Comment: rarely   Drug use: No     No Known Allergies  Review of Systems NEGATIVE UNLESS OTHERWISE INDICATED IN HPI      Objective:     BP 107/75    Pulse 82    Temp 98.3 F (36.8 C)    Ht 5\' 3"  (1.6 m)    Wt 189 lb 5.8 oz (85.9 kg)    SpO2 96%    BMI 33.54 kg/m   Wt Readings from Last 3 Encounters:  06/20/21 189 lb 5.8 oz (85.9 kg)  04/16/21 203 lb 8 oz (92.3 kg)  04/15/21 203 lb (92.1 kg)    BP Readings from Last 3 Encounters:  06/20/21 107/75  04/16/21  111/74  04/15/21 118/70     Physical Exam Constitutional:      Appearance: Normal appearance. She is obese.  Cardiovascular:     Rate and Rhythm: Normal rate and regular rhythm.     Pulses: Normal pulses.     Heart sounds: Normal heart sounds.  Pulmonary:     Effort: Pulmonary effort is normal.     Breath sounds: Normal breath sounds.  Neurological:     Mental Status: She is alert.  Psychiatric:        Mood and Affect: Mood normal.        Behavior: Behavior normal.        Thought Content: Thought content normal.        Judgment: Judgment normal.       Assessment & Plan:   Problem List Items Addressed This Visit   None Visit Diagnoses     Class 1 obesity due to excess calories without  serious comorbidity with body mass index (BMI) of 33.0 to 33.9 in adult    -  Primary   Relevant Medications   Semaglutide-Weight Management (WEGOVY) 0.25 MG/0.5ML SOAJ   Mixed hyperlipidemia            Meds ordered this encounter  Medications   DISCONTD: tirzepatide (MOUNJARO) 5 MG/0.5ML Pen    Sig: Inject 5 mg into the skin once a week.    Dispense:  6 mL    Refill:  1   Semaglutide-Weight Management (WEGOVY) 0.25 MG/0.5ML SOAJ    Sig: Inject 0.25 mg into the skin once a week.    Dispense:  2 mL    Refill:  0    Plan: -Congratulated patient on her weight loss and lifestyle changes, encouraged her to keep up the good work -Will try to go to the next dose of Mounjaro (5 mg); if not approved for this, will attempt to start on Wegovy to help with continued weight loss, which should help with her cholesterol overall as well. Possible SE discussed with her. -Pt will let me know how things are going and f/up in 4-6 weeks  Isais Klipfel M Dorthy Hustead, PA-C

## 2021-06-22 ENCOUNTER — Other Ambulatory Visit: Payer: Self-pay | Admitting: Physician Assistant

## 2021-06-22 ENCOUNTER — Encounter: Payer: Self-pay | Admitting: Physician Assistant

## 2021-06-22 ENCOUNTER — Other Ambulatory Visit (HOSPITAL_COMMUNITY): Payer: Self-pay

## 2021-06-22 MED ORDER — TIRZEPATIDE 5 MG/0.5ML ~~LOC~~ SOAJ: 5.0000 mg | SUBCUTANEOUS | 0 refills | Status: DC

## 2021-07-08 ENCOUNTER — Other Ambulatory Visit: Payer: Self-pay | Admitting: Registered Nurse

## 2021-07-08 DIAGNOSIS — F3342 Major depressive disorder, recurrent, in full remission: Secondary | ICD-10-CM

## 2021-09-15 ENCOUNTER — Other Ambulatory Visit: Payer: Self-pay | Admitting: Physician Assistant

## 2021-09-15 DIAGNOSIS — F3342 Major depressive disorder, recurrent, in full remission: Secondary | ICD-10-CM

## 2021-09-16 NOTE — Telephone Encounter (Signed)
Last visit: 06/20/21  Next visit: none scheduled  Last filled: 02/22/21  Quantity: 360 w/ 0 refills  Filled by Janeece Agee NP

## 2021-10-01 IMAGING — MG DIGITAL SCREENING BILAT W/ TOMO W/ CAD
8 series · 8 of 24 positions shown · non-contrast
Comparison: Previous exam(s).

CLINICAL DATA: Screening.

EXAM:
DIGITAL SCREENING BILATERAL MAMMOGRAM WITH TOMO AND CAD

[L CC synth-2D]
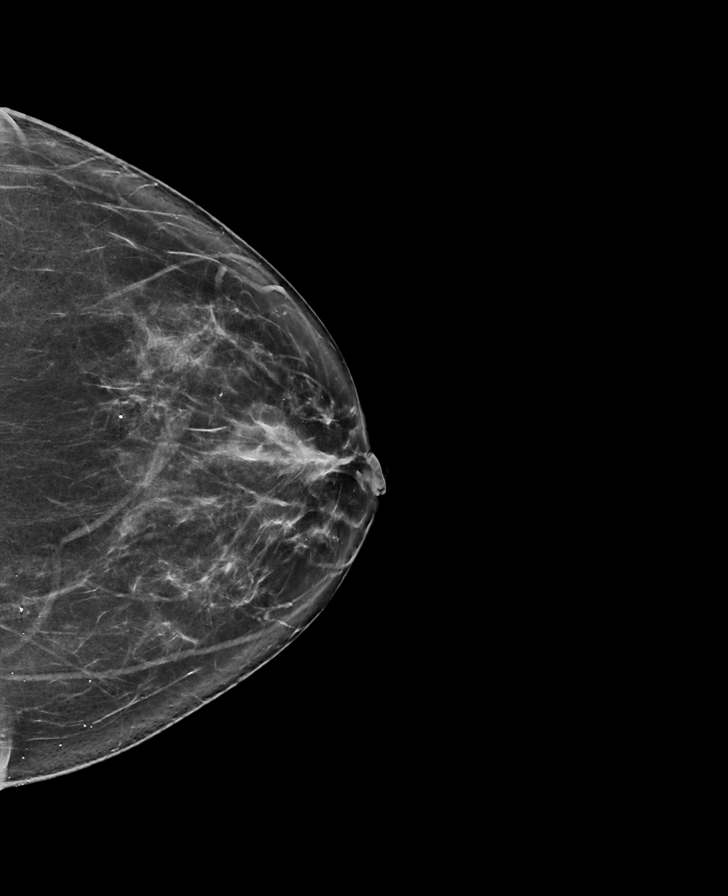

[R MLO synth-2D]
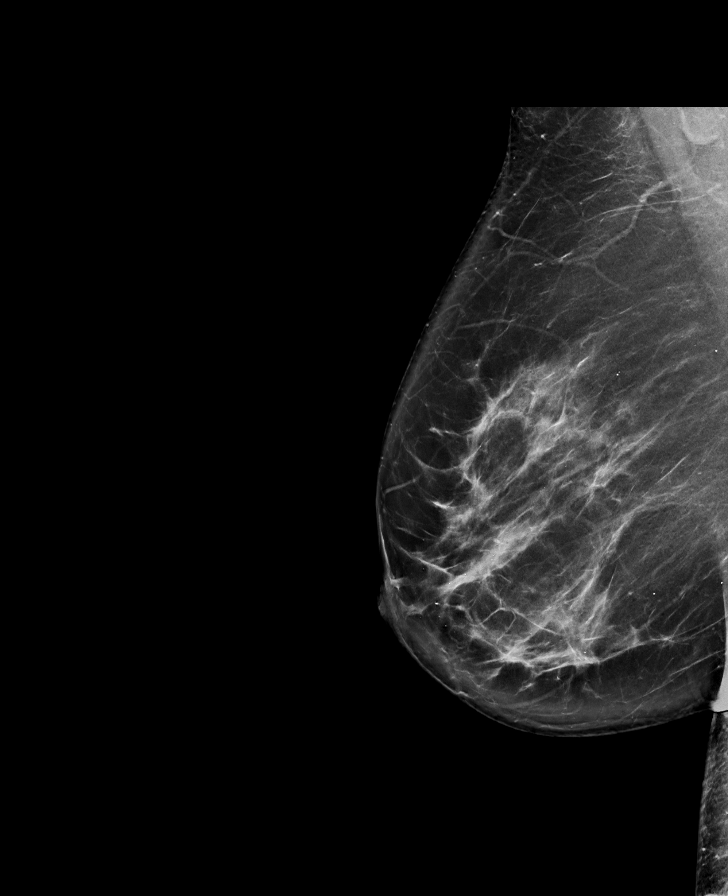

[L MLO synth-2D]
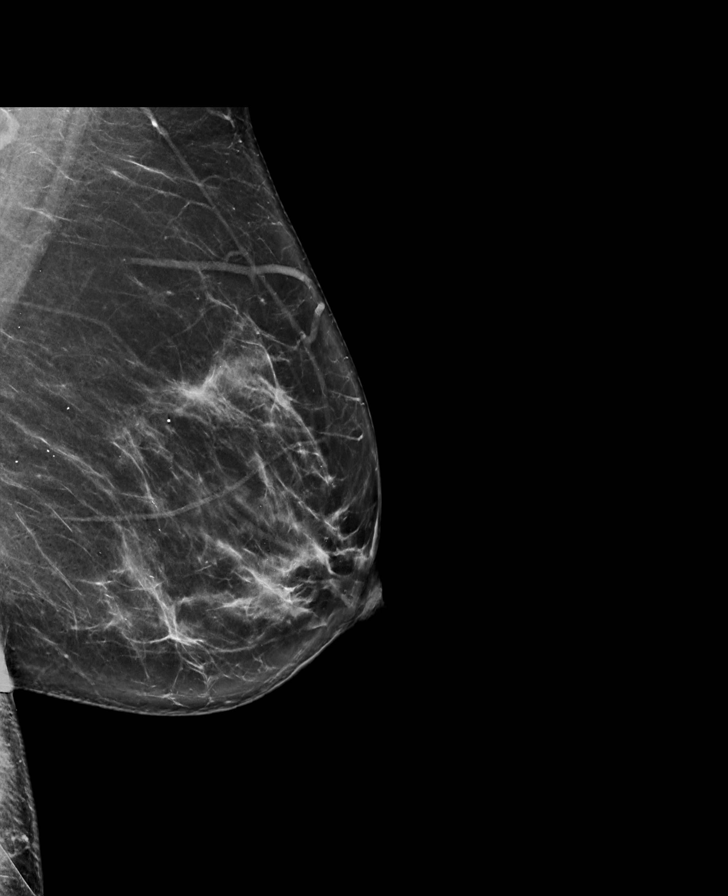

[R CC synth-2D]
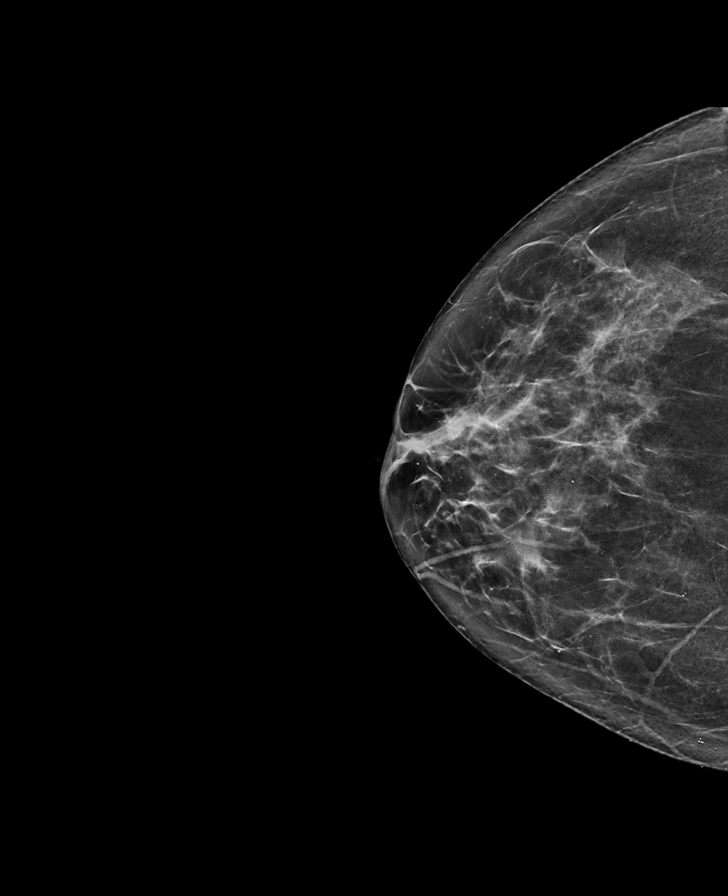

[R MLO tomo · tomo slice 47/93.0]
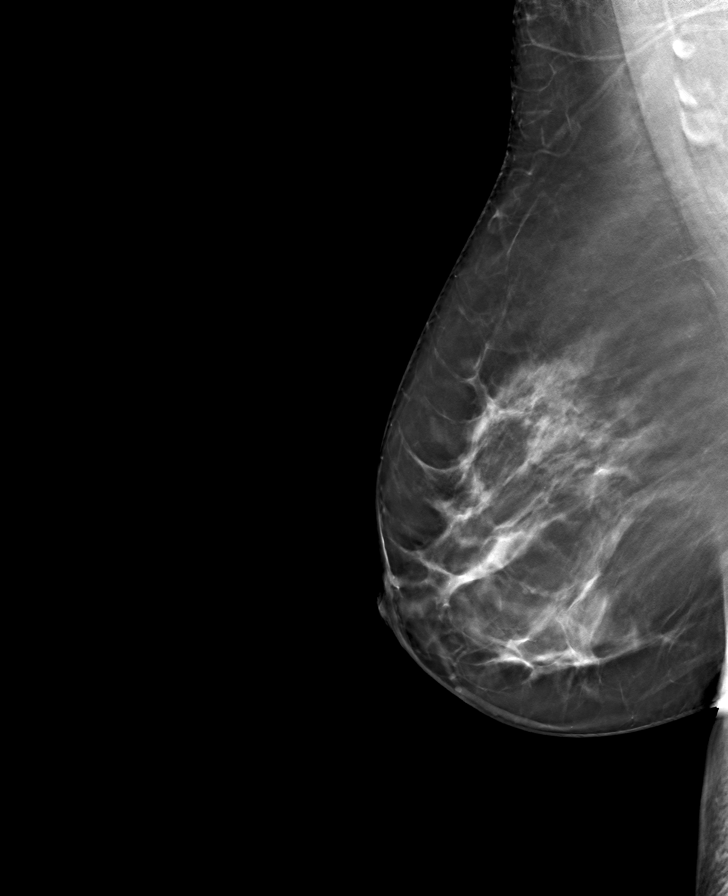

[L MLO tomo · tomo slice 43/85.0]
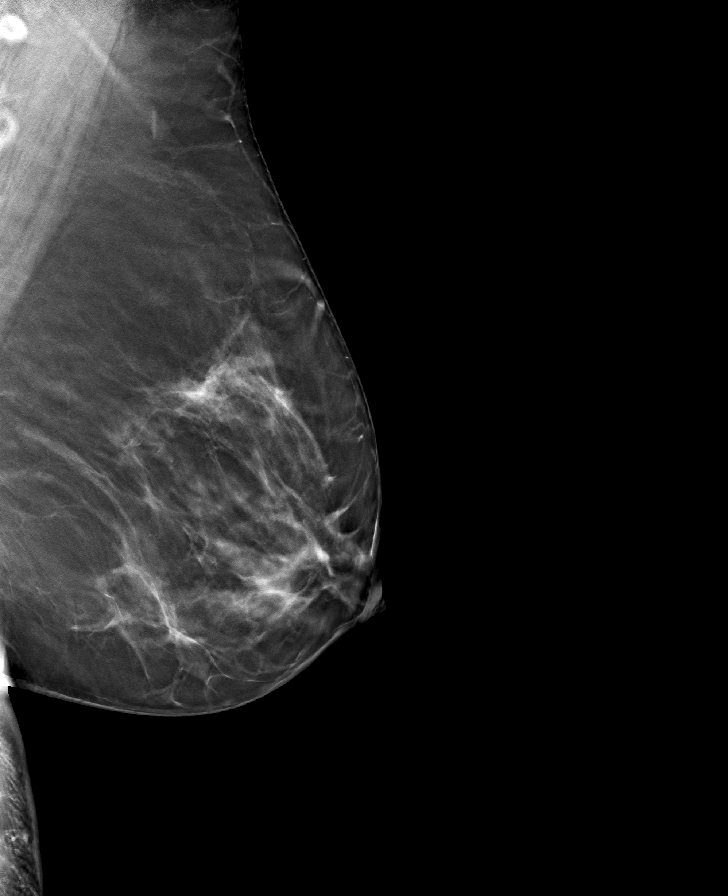

[L CC tomo · tomo slice 41/80.0]
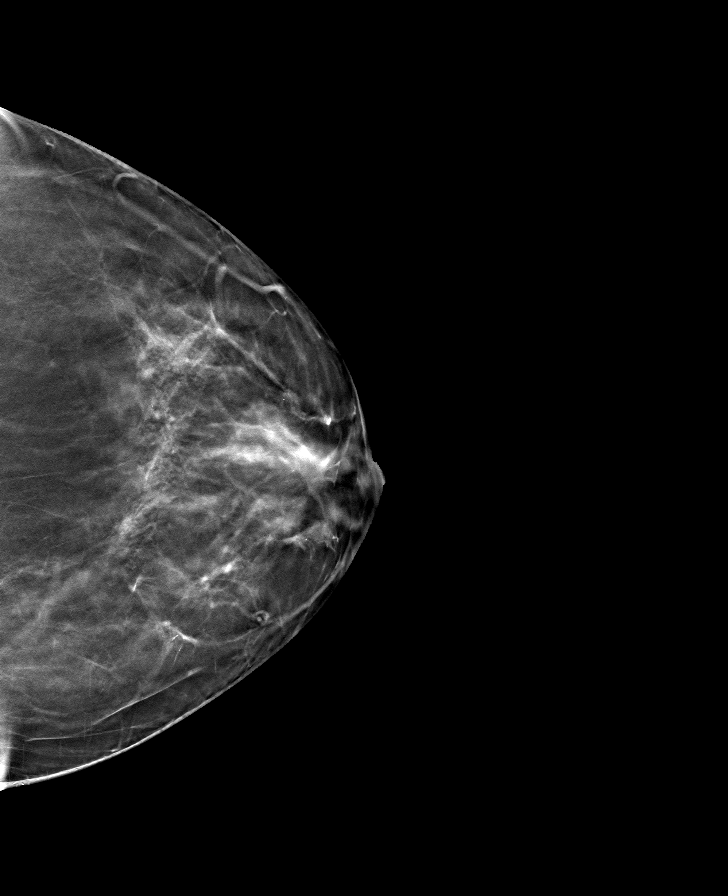

[R CC tomo · tomo slice 39/76.0]
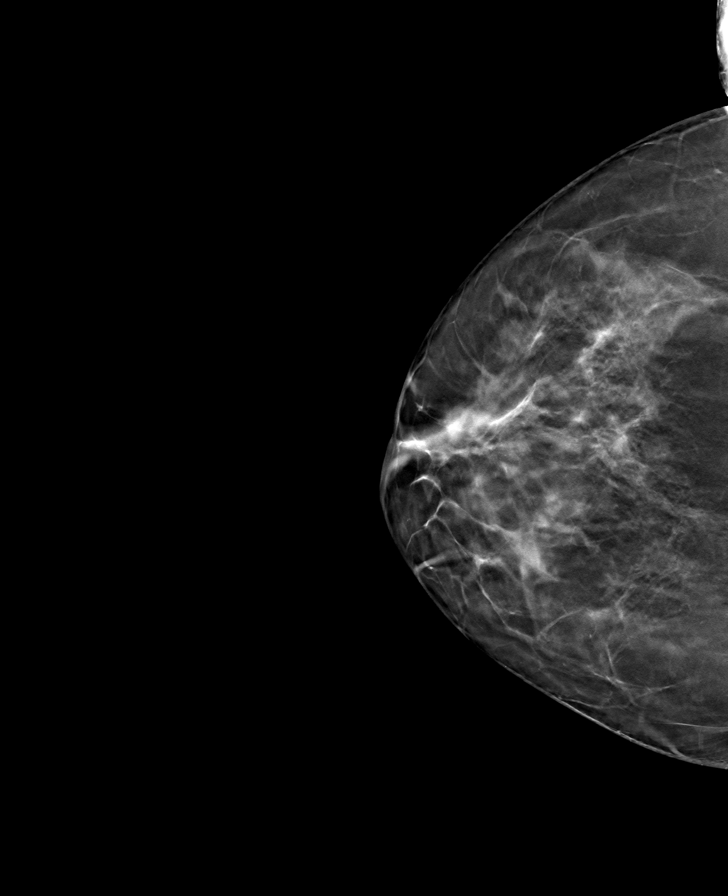

[8 of 24 positions shown; findings below may reference images not displayed]

ACR Breast Density Category c: The breast tissue is heterogeneously
dense, which may obscure small masses.
FINDINGS: There are no findings suspicious for malignancy. Images were
processed with CAD.
IMPRESSION: No mammographic evidence of malignancy. A result letter of this
screening mammogram will be mailed directly to the patient.

RECOMMENDATION:
Screening mammogram in one year. (Code:[id number])

BI-RADS CATEGORY  1: Negative.

## 2021-10-09 ENCOUNTER — Other Ambulatory Visit: Payer: Self-pay | Admitting: Physician Assistant

## 2021-10-09 DIAGNOSIS — Z1231 Encounter for screening mammogram for malignant neoplasm of breast: Secondary | ICD-10-CM

## 2021-10-18 ENCOUNTER — Ambulatory Visit
Admission: RE | Admit: 2021-10-18 | Discharge: 2021-10-18 | Disposition: A | Discharge status: Home / Self Care | Payer: BC Managed Care – PPO | Source: Ambulatory Visit | Attending: Physician Assistant | Admitting: Physician Assistant

## 2021-10-18 DIAGNOSIS — Z1231 Encounter for screening mammogram for malignant neoplasm of breast: Secondary | ICD-10-CM

## 2022-01-20 ENCOUNTER — Encounter: Payer: Self-pay | Admitting: *Deleted

## 2022-02-16 ENCOUNTER — Observation Stay (HOSPITAL_BASED_OUTPATIENT_CLINIC_OR_DEPARTMENT_OTHER)
Admission: EM | Admit: 2022-02-16 | Discharge: 2022-02-17 | Disposition: A | Discharge status: Home / Self Care | Payer: BC Managed Care – PPO | Attending: General Surgery | Admitting: General Surgery

## 2022-02-16 ENCOUNTER — Observation Stay (HOSPITAL_COMMUNITY): Payer: BC Managed Care – PPO | Admitting: Certified Registered Nurse Anesthetist

## 2022-02-16 ENCOUNTER — Encounter (HOSPITAL_COMMUNITY)
Admission: EM | Disposition: A | Discharge status: Home / Self Care | Payer: Self-pay | Source: Home / Self Care | Attending: Emergency Medicine

## 2022-02-16 ENCOUNTER — Emergency Department (HOSPITAL_BASED_OUTPATIENT_CLINIC_OR_DEPARTMENT_OTHER): Payer: BC Managed Care – PPO

## 2022-02-16 ENCOUNTER — Encounter (HOSPITAL_BASED_OUTPATIENT_CLINIC_OR_DEPARTMENT_OTHER): Payer: Self-pay

## 2022-02-16 ENCOUNTER — Other Ambulatory Visit: Payer: Self-pay

## 2022-02-16 DIAGNOSIS — K37 Unspecified appendicitis: Secondary | ICD-10-CM | POA: Diagnosis present

## 2022-02-16 DIAGNOSIS — K3580 Unspecified acute appendicitis: Secondary | ICD-10-CM | POA: Diagnosis not present

## 2022-02-16 DIAGNOSIS — R1031 Right lower quadrant pain: Secondary | ICD-10-CM | POA: Diagnosis present

## 2022-02-16 HISTORY — PX: LAPAROSCOPIC APPENDECTOMY: SHX408

## 2022-02-16 LAB — URINALYSIS, ROUTINE W REFLEX MICROSCOPIC
Bilirubin Urine: NEGATIVE
Glucose, UA: NEGATIVE mg/dL
Hgb urine dipstick: NEGATIVE
Ketones, ur: NEGATIVE mg/dL
Nitrite: NEGATIVE
Protein, ur: NEGATIVE mg/dL
Specific Gravity, Urine: 1.02 (ref 1.005–1.030)
pH: 5 (ref 5.0–8.0)

## 2022-02-16 LAB — COMPREHENSIVE METABOLIC PANEL
ALT: 29 U/L (ref 0–44)
AST: 28 U/L (ref 15–41)
Albumin: 4.1 g/dL (ref 3.5–5.0)
Alkaline Phosphatase: 88 U/L (ref 38–126)
Anion gap: 6 (ref 5–15)
BUN: 14 mg/dL (ref 6–20)
CO2: 23 mmol/L (ref 22–32)
Calcium: 9.4 mg/dL (ref 8.9–10.3)
Chloride: 107 mmol/L (ref 98–111)
Creatinine, Ser: 0.65 mg/dL (ref 0.44–1.00)
GFR, Estimated: >60 mL/min (ref >60)
Glucose, Bld: 123 mg/dL — ABNORMAL HIGH (ref 70–99)
Potassium: 4.1 mmol/L (ref 3.5–5.1)
Sodium: 136 mmol/L (ref 135–145)
Total Bilirubin: 0.7 mg/dL (ref 0.3–1.2)
Total Protein: 7.1 g/dL (ref 6.5–8.1)

## 2022-02-16 LAB — CBC WITH DIFFERENTIAL/PLATELET
Abs Immature Granulocytes: 0.05 10*3/uL (ref 0.00–0.07)
Basophils Absolute: 0 10*3/uL (ref 0.0–0.1)
Basophils Relative: 0 %
Eosinophils Absolute: 0.1 10*3/uL (ref 0.0–0.5)
Eosinophils Relative: 1 %
HCT: 38.9 % (ref 36.0–46.0)
Hemoglobin: 13.3 g/dL (ref 12.0–15.0)
Immature Granulocytes: 0 %
Lymphocytes Relative: 21 %
Lymphs Abs: 2.5 10*3/uL (ref 0.7–4.0)
MCH: 30.3 pg (ref 26.0–34.0)
MCHC: 34.2 g/dL (ref 30.0–36.0)
MCV: 88.6 fL (ref 80.0–100.0)
Monocytes Absolute: 0.6 10*3/uL (ref 0.1–1.0)
Monocytes Relative: 5 %
Neutro Abs: 8.8 10*3/uL — ABNORMAL HIGH (ref 1.7–7.7)
Neutrophils Relative %: 73 %
Platelets: 280 10*3/uL (ref 150–400)
RBC: 4.39 MIL/uL (ref 3.87–5.11)
RDW: 13.8 % (ref 11.5–15.5)
WBC: 12 10*3/uL — ABNORMAL HIGH (ref 4.0–10.5)
nRBC: 0 % (ref 0.0–0.2)

## 2022-02-16 LAB — URINALYSIS, MICROSCOPIC (REFLEX)

## 2022-02-16 LAB — LIPASE, BLOOD: Lipase: 39 U/L (ref 11–51)

## 2022-02-16 LAB — SURGICAL PCR SCREEN
MRSA, PCR: NEGATIVE
Staphylococcus aureus: NEGATIVE

## 2022-02-16 LAB — PREGNANCY, URINE: Preg Test, Ur: NEGATIVE

## 2022-02-16 SURGERY — APPENDECTOMY, LAPAROSCOPIC
Anesthesia: General | Site: Abdomen

## 2022-02-16 MED ORDER — ONDANSETRON HCL 4 MG/2ML IJ SOLN
INTRAMUSCULAR | Status: AC
  Filled 2022-02-16: qty 2

## 2022-02-16 MED ORDER — MORPHINE SULFATE (PF) 4 MG/ML IV SOLN
4.0000 mg | Freq: Once | INTRAVENOUS | Status: AC
  Administered 2022-02-16: 4 mg via INTRAVENOUS
  Filled 2022-02-16: qty 1

## 2022-02-16 MED ORDER — ONDANSETRON HCL 4 MG/2ML IJ SOLN
4.0000 mg | Freq: Once | INTRAMUSCULAR | Status: AC
  Administered 2022-02-16: 4 mg via INTRAVENOUS
  Filled 2022-02-16: qty 2

## 2022-02-16 MED ORDER — OXYCODONE-ACETAMINOPHEN 5-325 MG PO TABS
1.0000 | ORAL_TABLET | ORAL | Status: DC | PRN
  Administered 2022-02-17 (×2): 2 via ORAL
  Filled 2022-02-16 (×3): qty 2

## 2022-02-16 MED ORDER — FENTANYL CITRATE (PF) 250 MCG/5ML IJ SOLN
INTRAMUSCULAR | Status: DC | PRN
  Administered 2022-02-16 (×4): 50 ug via INTRAVENOUS

## 2022-02-16 MED ORDER — FENTANYL CITRATE PF 50 MCG/ML IJ SOSY: 25.0000 ug | PREFILLED_SYRINGE | INTRAMUSCULAR | Status: DC | PRN

## 2022-02-16 MED ORDER — LACTATED RINGERS IR SOLN
Status: DC | PRN
  Administered 2022-02-16: 1000 mL

## 2022-02-16 MED ORDER — FENTANYL CITRATE PF 50 MCG/ML IJ SOSY
PREFILLED_SYRINGE | INTRAMUSCULAR | Status: AC
  Filled 2022-02-16: qty 1

## 2022-02-16 MED ORDER — ONDANSETRON HCL 4 MG/2ML IJ SOLN
INTRAMUSCULAR | Status: DC | PRN
  Administered 2022-02-16: 4 mg via INTRAVENOUS

## 2022-02-16 MED ORDER — LIDOCAINE HCL (PF) 2 % IJ SOLN
INTRAMUSCULAR | Status: AC
  Filled 2022-02-16: qty 5

## 2022-02-16 MED ORDER — PHENYLEPHRINE 80 MCG/ML (10ML) SYRINGE FOR IV PUSH (FOR BLOOD PRESSURE SUPPORT)
PREFILLED_SYRINGE | INTRAVENOUS | Status: AC
  Filled 2022-02-16: qty 10

## 2022-02-16 MED ORDER — SODIUM CHLORIDE 0.9 % IV SOLN: INTRAVENOUS | Status: DC

## 2022-02-16 MED ORDER — DEXAMETHASONE SODIUM PHOSPHATE 10 MG/ML IJ SOLN
INTRAMUSCULAR | Status: DC | PRN
  Administered 2022-02-16: 5 mg via INTRAVENOUS

## 2022-02-16 MED ORDER — FENTANYL CITRATE (PF) 250 MCG/5ML IJ SOLN
INTRAMUSCULAR | Status: AC
  Filled 2022-02-16: qty 5

## 2022-02-16 MED ORDER — PANTOPRAZOLE SODIUM 40 MG IV SOLR
40.0000 mg | Freq: Every day | INTRAVENOUS | Status: DC
  Administered 2022-02-16: 40 mg via INTRAVENOUS
  Filled 2022-02-16: qty 10

## 2022-02-16 MED ORDER — EPHEDRINE SULFATE-NACL 50-0.9 MG/10ML-% IV SOSY
PREFILLED_SYRINGE | INTRAVENOUS | Status: DC | PRN
  Administered 2022-02-16: 5 mg via INTRAVENOUS

## 2022-02-16 MED ORDER — LACTATED RINGERS IV SOLN: INTRAVENOUS | Status: DC | PRN

## 2022-02-16 MED ORDER — SUCCINYLCHOLINE CHLORIDE 200 MG/10ML IV SOSY
PREFILLED_SYRINGE | INTRAVENOUS | Status: AC
  Filled 2022-02-16: qty 10

## 2022-02-16 MED ORDER — SODIUM CHLORIDE 0.9 % IV SOLN
2.0000 g | Freq: Every day | INTRAVENOUS | Status: DC
  Administered 2022-02-16 – 2022-02-17 (×2): 2 g via INTRAVENOUS
  Filled 2022-02-16 (×2): qty 20

## 2022-02-16 MED ORDER — SUGAMMADEX SODIUM 200 MG/2ML IV SOLN
INTRAVENOUS | Status: DC | PRN
  Administered 2022-02-16: 200 mg via INTRAVENOUS

## 2022-02-16 MED ORDER — PROPOFOL 10 MG/ML IV BOLUS
INTRAVENOUS | Status: DC | PRN
  Administered 2022-02-16: 170 mg via INTRAVENOUS

## 2022-02-16 MED ORDER — SERTRALINE HCL 50 MG PO TABS
150.0000 mg | ORAL_TABLET | Freq: Every day | ORAL | Status: DC
  Administered 2022-02-17: 150 mg via ORAL
  Filled 2022-02-16: qty 1

## 2022-02-16 MED ORDER — ROCURONIUM BROMIDE 10 MG/ML (PF) SYRINGE
PREFILLED_SYRINGE | INTRAVENOUS | Status: DC | PRN
  Administered 2022-02-16: 40 mg via INTRAVENOUS

## 2022-02-16 MED ORDER — PROPOFOL 10 MG/ML IV BOLUS
INTRAVENOUS | Status: AC
  Filled 2022-02-16: qty 20

## 2022-02-16 MED ORDER — SUCCINYLCHOLINE CHLORIDE 200 MG/10ML IV SOSY
PREFILLED_SYRINGE | INTRAVENOUS | Status: DC | PRN
  Administered 2022-02-16: 140 mg via INTRAVENOUS

## 2022-02-16 MED ORDER — BUPIVACAINE-EPINEPHRINE (PF) 0.25% -1:200000 IJ SOLN
INTRAMUSCULAR | Status: AC
  Filled 2022-02-16: qty 30

## 2022-02-16 MED ORDER — 0.9 % SODIUM CHLORIDE (POUR BTL) OPTIME
TOPICAL | Status: DC | PRN
  Administered 2022-02-16: 1000 mL

## 2022-02-16 MED ORDER — DEXAMETHASONE SODIUM PHOSPHATE 10 MG/ML IJ SOLN
INTRAMUSCULAR | Status: AC
  Filled 2022-02-16: qty 1

## 2022-02-16 MED ORDER — PIPERACILLIN-TAZOBACTAM 3.375 G IVPB 30 MIN
3.3750 g | Freq: Once | INTRAVENOUS | Status: AC
  Administered 2022-02-16: 3.375 g via INTRAVENOUS
  Filled 2022-02-16: qty 50

## 2022-02-16 MED ORDER — MIDAZOLAM HCL 2 MG/2ML IJ SOLN
INTRAMUSCULAR | Status: AC
  Filled 2022-02-16: qty 2

## 2022-02-16 MED ORDER — LIDOCAINE HCL (CARDIAC) PF 100 MG/5ML IV SOSY
PREFILLED_SYRINGE | INTRAVENOUS | Status: DC | PRN
  Administered 2022-02-16: 100 mg via INTRAVENOUS

## 2022-02-16 MED ORDER — ONDANSETRON 4 MG PO TBDP: 4.0000 mg | ORAL_TABLET | Freq: Four times a day (QID) | ORAL | Status: DC | PRN

## 2022-02-16 MED ORDER — MORPHINE SULFATE (PF) 2 MG/ML IV SOLN
1.0000 mg | INTRAVENOUS | Status: DC | PRN
  Administered 2022-02-16 – 2022-02-17 (×4): 2 mg via INTRAVENOUS
  Filled 2022-02-16 (×4): qty 1

## 2022-02-16 MED ORDER — FENTANYL CITRATE PF 50 MCG/ML IJ SOSY
50.0000 ug | PREFILLED_SYRINGE | Freq: Once | INTRAMUSCULAR | Status: AC
  Administered 2022-02-16: 50 ug via INTRAVENOUS
  Filled 2022-02-16: qty 1

## 2022-02-16 MED ORDER — PHENYLEPHRINE 80 MCG/ML (10ML) SYRINGE FOR IV PUSH (FOR BLOOD PRESSURE SUPPORT)
PREFILLED_SYRINGE | INTRAVENOUS | Status: DC | PRN
  Administered 2022-02-16 (×4): 160 ug via INTRAVENOUS

## 2022-02-16 MED ORDER — MIDAZOLAM HCL 2 MG/2ML IJ SOLN
INTRAMUSCULAR | Status: DC | PRN
  Administered 2022-02-16: 2 mg via INTRAVENOUS

## 2022-02-16 MED ORDER — BUPIVACAINE-EPINEPHRINE 0.25% -1:200000 IJ SOLN
INTRAMUSCULAR | Status: DC | PRN
  Administered 2022-02-16: 17 mL

## 2022-02-16 MED ORDER — METRONIDAZOLE 500 MG/100ML IV SOLN
500.0000 mg | Freq: Two times a day (BID) | INTRAVENOUS | Status: DC
  Administered 2022-02-16 – 2022-02-17 (×3): 500 mg via INTRAVENOUS
  Filled 2022-02-16 (×3): qty 100

## 2022-02-16 MED ORDER — ONDANSETRON HCL 4 MG/2ML IJ SOLN: 4.0000 mg | Freq: Four times a day (QID) | INTRAMUSCULAR | Status: DC | PRN

## 2022-02-16 MED ORDER — ROCURONIUM BROMIDE 10 MG/ML (PF) SYRINGE
PREFILLED_SYRINGE | INTRAVENOUS | Status: AC
  Filled 2022-02-16: qty 10

## 2022-02-16 MED ORDER — FENTANYL CITRATE PF 50 MCG/ML IJ SOSY
25.0000 ug | PREFILLED_SYRINGE | INTRAMUSCULAR | Status: DC | PRN
  Administered 2022-02-16: 50 ug via INTRAVENOUS

## 2022-02-16 MED ORDER — EPHEDRINE 5 MG/ML INJ
INTRAVENOUS | Status: AC
  Filled 2022-02-16: qty 5

## 2022-02-16 SURGICAL SUPPLY — 38 items
ADH SKN CLS APL DERMABOND .7 (GAUZE/BANDAGES/DRESSINGS) ×1
APL PRP STRL LF DISP 70% ISPRP (MISCELLANEOUS) ×1
APPLIER CLIP ROT 10 11.4 M/L (STAPLE) ×1
APR CLP MED LRG 11.4X10 (STAPLE) ×1
BAG COUNTER SPONGE SURGICOUNT (BAG) IMPLANT
BAG SPNG CNTER NS LX DISP (BAG)
CABLE HIGH FREQUENCY MONO STRZ (ELECTRODE) ×1 IMPLANT
CHLORAPREP W/TINT 26 (MISCELLANEOUS) ×1 IMPLANT
CLIP APPLIE ROT 10 11.4 M/L (STAPLE) IMPLANT
CUTTER FLEX LINEAR 45M (STAPLE) IMPLANT
DERMABOND ADVANCED .7 DNX12 (GAUZE/BANDAGES/DRESSINGS) ×1 IMPLANT
ELECT REM PT RETURN 15FT ADLT (MISCELLANEOUS) ×1 IMPLANT
ENDOLOOP SUT PDS II  0 18 (SUTURE)
ENDOLOOP SUT PDS II 0 18 (SUTURE) IMPLANT
GLOVE BIO SURGEON STRL SZ7.5 (GLOVE) ×1 IMPLANT
GOWN STRL REUS W/ TWL LRG LVL3 (GOWN DISPOSABLE) IMPLANT
GOWN STRL REUS W/TWL LRG LVL3 (GOWN DISPOSABLE)
IRRIG SUCT STRYKERFLOW 2 WTIP (MISCELLANEOUS) ×1
IRRIGATION SUCT STRKRFLW 2 WTP (MISCELLANEOUS) ×1 IMPLANT
KIT BASIN OR (CUSTOM PROCEDURE TRAY) ×1 IMPLANT
KIT TURNOVER KIT A (KITS) IMPLANT
PENCIL SMOKE EVACUATOR (MISCELLANEOUS) IMPLANT
RELOAD 45 VASCULAR/THIN (ENDOMECHANICALS) IMPLANT
RELOAD STAPLE 45 2.5 WHT GRN (ENDOMECHANICALS) IMPLANT
RELOAD STAPLE 45 3.5 BLU ETS (ENDOMECHANICALS) IMPLANT
RELOAD STAPLE TA45 3.5 REG BLU (ENDOMECHANICALS) ×1 IMPLANT
SCISSORS LAP 5X35 DISP (ENDOMECHANICALS) ×1 IMPLANT
SET TUBE SMOKE EVAC HIGH FLOW (TUBING) ×1 IMPLANT
SHEARS HARMONIC ACE PLUS 36CM (ENDOMECHANICALS) ×1 IMPLANT
SPIKE FLUID TRANSFER (MISCELLANEOUS) ×1 IMPLANT
SUT MNCRL AB 4-0 PS2 18 (SUTURE) ×1 IMPLANT
SYS BAG RETRIEVAL 10MM (BASKET) ×1
SYSTEM BAG RETRIEVAL 10MM (BASKET) ×1 IMPLANT
TOWEL OR 17X26 10 PK STRL BLUE (TOWEL DISPOSABLE) ×1 IMPLANT
TRAY FOLEY MTR SLVR 16FR STAT (SET/KITS/TRAYS/PACK) IMPLANT
TRAY LAPAROSCOPIC (CUSTOM PROCEDURE TRAY) ×1 IMPLANT
TROCAR BALLN 12MMX100 BLUNT (TROCAR) ×1 IMPLANT
TROCAR Z-THREAD OPTICAL 5X100M (TROCAR) ×1 IMPLANT

## 2022-02-16 NOTE — Anesthesia Procedure Notes (Signed)
Procedure Name: Intubation Date/Time: 02/16/2022 5:33 PM  Performed by: Raenette Rover, CRNAPre-anesthesia Checklist: Patient identified, Emergency Drugs available, Suction available and Patient being monitored Patient Re-evaluated:Patient Re-evaluated prior to induction Oxygen Delivery Method: Circle system utilized Preoxygenation: Pre-oxygenation with 100% oxygen Induction Type: IV induction, Cricoid Pressure applied and Rapid sequence Laryngoscope Size: Mac and 3 Grade View: Grade I Tube type: Oral Tube size: 7.0 mm Number of attempts: 1 Airway Equipment and Method: Stylet Placement Confirmation: ETT inserted through vocal cords under direct vision, positive ETCO2 and breath sounds checked- equal and bilateral Secured at: 21 cm Tube secured with: Tape Dental Injury: Teeth and Oropharynx as per pre-operative assessment

## 2022-02-16 NOTE — Interval H&P Note (Signed)
History and Physical Interval Note:  02/16/2022 4:49 PM  Sandra Mills  has presented today for surgery, with the diagnosis of acute appendicitis.  The various methods of treatment have been discussed with the patient and family. After consideration of risks, benefits and other options for treatment, the patient has consented to  Procedure(s): APPENDECTOMY LAPAROSCOPIC (N/A) as a surgical intervention.  The patient's history has been reviewed, patient examined, no change in status, stable for surgery.  I have reviewed the patient's chart and labs.  Questions were answered to the patient's satisfaction.     Autumn Messing III

## 2022-02-16 NOTE — ED Triage Notes (Signed)
Generalized right sided abdominal pain unable to differentiate if it is upper or lower. Pain suddenly appeared ~730PM after eating.   Nausea, and diarrhea. Denies vomiting.   Sts a hx of fatty liver.

## 2022-02-16 NOTE — Anesthesia Preprocedure Evaluation (Signed)
Anesthesia Evaluation  Patient identified by MRN, date of birth, ID band Patient awake    Reviewed: Allergy & Precautions, NPO status , Patient's Chart, lab work & pertinent test results  Airway Mallampati: II       Dental   Pulmonary    breath sounds clear to auscultation       Cardiovascular negative cardio ROS   Rhythm:Regular Rate:Normal     Neuro/Psych  Headaches, PSYCHIATRIC DISORDERS    GI/Hepatic Neg liver ROS, History noted Dr. Nyoka Cowden   Endo/Other  negative endocrine ROS  Renal/GU negative Renal ROS     Musculoskeletal   Abdominal   Peds  Hematology   Anesthesia Other Findings   Reproductive/Obstetrics                             Anesthesia Physical Anesthesia Plan  ASA: 2 and emergent  Anesthesia Plan:    Post-op Pain Management: Tylenol PO (pre-op)*   Induction:   PONV Risk Score and Plan: Ondansetron, Dexamethasone and Midazolam  Airway Management Planned: Oral ETT  Additional Equipment:   Intra-op Plan:   Post-operative Plan: Extubation in OR  Informed Consent: I have reviewed the patients History and Physical, chart, labs and discussed the procedure including the risks, benefits and alternatives for the proposed anesthesia with the patient or authorized representative who has indicated his/her understanding and acceptance.     Dental advisory given  Plan Discussed with: CRNA and Anesthesiologist  Anesthesia Plan Comments:         Anesthesia Quick Evaluation

## 2022-02-16 NOTE — ED Provider Notes (Signed)
[facility] EMERGENCY DEPARTMENT Provider Note   CSN: [id number] Arrival date & time: 02/16/22  0222     History  Chief Complaint  Patient presents with   Abdominal Pain    Sandra Mills is a 61 y.o. female.  The history is provided by the patient and the spouse.  Abdominal Pain Pain location:  RUQ and RLQ Pain radiates to:  Does not radiate Pain severity:  Severe Onset quality:  Sudden Duration:  7 hours Timing:  Constant Progression:  Worsening Chronicity:  New Context: not alcohol use, not suspicious food intake and not trauma   Associated symptoms: nausea   Associated symptoms: no dysuria and no fever        Home Medications Prior to Admission medications   Medication Sig Start Date End Date Taking? Authorizing Provider  cetirizine (ZYRTEC) 10 MG tablet Take 1 tablet (10 mg total) by mouth daily. 12/06/18   Maximiano Coss, NP  fluticasone (FLONASE) 50 MCG/ACT nasal spray PLACE 1 SPRAY INTO BOTH NOSTRILS 2 (TWO) TIMES DAILY. 11/03/19   Maximiano Coss, NP  gabapentin (NEURONTIN) 100 MG capsule TAKE 2 CAPSULES BY MOUTH TWICE A DAY 09/16/21   Allwardt, Alyssa M, PA-C  Semaglutide,0.25 or 0.5MG /DOS, (OZEMPIC, 0.25 OR 0.5 MG/DOSE,) 2 MG/1.5ML SOPN Inject 0.25 mg into the skin once a week. 06/24/21   Allwardt, Alyssa M, PA-C  sertraline (ZOLOFT) 50 MG tablet TAKE 3 TABLETS BY MOUTH EVERY DAY 02/22/21   Maximiano Coss, NP  tirzepatide [facility]) 2.5 MG/0.5ML Pen Inject 2.5 mg into the skin once a week. 04/16/21   Allwardt, Randa Evens, PA-C      Allergies    Patient has no known allergies.    Review of Systems   Review of Systems  Constitutional:  Negative for fever.  HENT:  Negative for facial swelling.   Eyes:  Negative for redness.  Respiratory:  Negative for wheezing and stridor.   Cardiovascular:  Negative for palpitations and leg swelling.  Gastrointestinal:  Positive for abdominal pain and nausea.  Genitourinary:  Negative for dysuria.   Neurological:  Negative for facial asymmetry.  Psychiatric/Behavioral:  Negative for agitation.   All other systems reviewed and are negative.   Physical Exam Updated Vital Signs BP 131/82   Pulse 78   Temp (!) 97.5 F (36.4 C)   Resp 20   Ht 5\' 2"  (1.575 m)   Wt 83.9 kg   SpO2 97%   BMI 33.84 kg/m  Physical Exam Constitutional:      General: She is not in acute distress.    Appearance: She is well-developed.  HENT:     Head: Normocephalic and atraumatic.     Nose: Nose normal.  Eyes:     Pupils: Pupils are equal, round, and reactive to light.  Cardiovascular:     Rate and Rhythm: Normal rate and regular rhythm.     Pulses: Normal pulses.     Heart sounds: Normal heart sounds.  Pulmonary:     Effort: Pulmonary effort is normal. No respiratory distress.     Breath sounds: Normal breath sounds.  Abdominal:     General: Bowel sounds are normal. There is no distension.     Palpations: Abdomen is soft.     Tenderness: There is abdominal tenderness. There is guarding and rebound.  Genitourinary:    Vagina: No vaginal discharge.  Musculoskeletal:        General: Normal range of motion.     Cervical back: Neck supple.  Skin:    General: Skin is warm and dry.     Capillary Refill: Capillary refill takes less than 2 seconds.     Findings: No erythema or rash.  Neurological:     General: No focal deficit present.     Mental Status: She is oriented to person, place, and time.     Deep Tendon Reflexes: Reflexes normal.  Psychiatric:        Mood and Affect: Mood normal.        Behavior: Behavior normal.     ED Results / Procedures / Treatments   Labs (all labs ordered are listed, but only abnormal results are displayed) Results for orders placed or performed during the hospital encounter of 02/16/22  Lipase, blood  Result Value Ref Range   Lipase 39 11 - 51 U/L  Comprehensive metabolic panel  Result Value Ref Range   Sodium 136 135 - 145 mmol/L   Potassium 4.1  3.5 - 5.1 mmol/L   Chloride 107 98 - 111 mmol/L   CO2 23 22 - 32 mmol/L   Glucose, Bld 123 (H) 70 - 99 mg/dL   BUN 14 6 - 20 mg/dL   Creatinine, Ser 0.65 0.44 - 1.00 mg/dL   Calcium 9.4 8.9 - 10.3 mg/dL   Total Protein 7.1 6.5 - 8.1 g/dL   Albumin 4.1 3.5 - 5.0 g/dL   AST 28 15 - 41 U/L   ALT 29 0 - 44 U/L   Alkaline Phosphatase 88 38 - 126 U/L   Total Bilirubin 0.7 0.3 - 1.2 mg/dL   GFR, Estimated >60 >60 mL/min   Anion gap 6 5 - 15  Urinalysis, Routine w reflex microscopic Urine, Clean Catch  Result Value Ref Range   Color, Urine YELLOW YELLOW   APPearance HAZY (A) CLEAR   Specific Gravity, Urine 1.020 1.005 - 1.030   pH 5.0 5.0 - 8.0   Glucose, UA NEGATIVE NEGATIVE mg/dL   Hgb urine dipstick NEGATIVE NEGATIVE   Bilirubin Urine NEGATIVE NEGATIVE   Ketones, ur NEGATIVE NEGATIVE mg/dL   Protein, ur NEGATIVE NEGATIVE mg/dL   Nitrite NEGATIVE NEGATIVE   Leukocytes,Ua SMALL (A) NEGATIVE  CBC with Differential  Result Value Ref Range   WBC 12.0 (H) 4.0 - 10.5 K/uL   RBC 4.39 3.87 - 5.11 MIL/uL   Hemoglobin 13.3 12.0 - 15.0 g/dL   HCT 38.9 36.0 - 46.0 %   MCV 88.6 80.0 - 100.0 fL   MCH 30.3 26.0 - 34.0 pg   MCHC 34.2 30.0 - 36.0 g/dL   RDW 13.8 11.5 - 15.5 %   Platelets 280 150 - 400 K/uL   nRBC 0.0 0.0 - 0.2 %   Neutrophils Relative % 73 %   Neutro Abs 8.8 (H) 1.7 - 7.7 K/uL   Lymphocytes Relative 21 %   Lymphs Abs 2.5 0.7 - 4.0 K/uL   Monocytes Relative 5 %   Monocytes Absolute 0.6 0.1 - 1.0 K/uL   Eosinophils Relative 1 %   Eosinophils Absolute 0.1 0.0 - 0.5 K/uL   Basophils Relative 0 %   Basophils Absolute 0.0 0.0 - 0.1 K/uL   Immature Granulocytes 0 %   Abs Immature Granulocytes 0.05 0.00 - 0.07 K/uL  Pregnancy, urine  Result Value Ref Range   Preg Test, Ur NEGATIVE NEGATIVE  Urinalysis, Microscopic (reflex)  Result Value Ref Range   RBC / HPF 0-5 0 - 5 RBC/hpf   WBC, UA 21-50 0 - 5 WBC/hpf  Bacteria, UA RARE (A) NONE SEEN   Squamous Epithelial / LPF 0-5  0 - 5   CT Renal Stone Study  Result Date: 02/16/2022 CLINICAL DATA:  Flank pain. Kidney stones suspected. Right-sided abdominal pain. Nausea and diarrhea. EXAM: CT ABDOMEN AND PELVIS WITHOUT CONTRAST TECHNIQUE: Multidetector CT imaging of the abdomen and pelvis was performed following the standard protocol without IV contrast. RADIATION DOSE REDUCTION: This exam was performed according to the departmental dose-optimization program which includes automated exposure control, adjustment of the mA and/or kV according to patient size and/or use of iterative reconstruction technique. COMPARISON:  02/01/2021 FINDINGS: Lower chest: Lung bases are clear. Hepatobiliary: Several large cysts are demonstrated in the liver, largest in the dome measuring 5.9 cm greatest diameter. No change in appearance since previous study. Mild diffuse fatty infiltration of the liver. Gallbladder and bile ducts are unremarkable. Pancreas: Unremarkable. No pancreatic ductal dilatation or surrounding inflammatory changes. Spleen: Normal in size without focal abnormality. Adrenals/Urinary Tract: Adrenal glands are unremarkable. Kidneys are normal, without renal calculi, focal lesion, or hydronephrosis. Bladder is unremarkable. Stomach/Bowel: Stomach, small bowel, and colon are not abnormally distended. No wall thickening or inflammatory changes. The appendix is mildly dilated and fluid-filled, measuring 12 mm in diameter. Suggestion of a small appendicolith at the base. Mild stranding around the appendix. Changes are consistent with acute appendicitis. No abscess. Vascular/Lymphatic: No significant vascular findings are present. No enlarged abdominal or pelvic lymph nodes. Reproductive: Uterus and bilateral adnexa are unremarkable. Other: No abdominal wall hernia or abnormality. No abdominopelvic ascites. Musculoskeletal: No acute or significant osseous findings. IMPRESSION: 1. Changes of acute appendicitis. No abscess. Small appendicoliths.  Appendiceal diameter is 12 mm. 2. Stable appearance of hepatic cysts. No imaging follow-up is indicated. 3. Mild diffuse fatty infiltration of the liver. 4. No renal or ureteral stone or obstruction. Electronically Signed   By: Lucienne Capers M.D.   On: 02/16/2022 03:58    Radiology CT Renal Stone Study  Result Date: 02/16/2022 CLINICAL DATA:  Flank pain. Kidney stones suspected. Right-sided abdominal pain. Nausea and diarrhea. EXAM: CT ABDOMEN AND PELVIS WITHOUT CONTRAST TECHNIQUE: Multidetector CT imaging of the abdomen and pelvis was performed following the standard protocol without IV contrast. RADIATION DOSE REDUCTION: This exam was performed according to the departmental dose-optimization program which includes automated exposure control, adjustment of the mA and/or kV according to patient size and/or use of iterative reconstruction technique. COMPARISON:  02/01/2021 FINDINGS: Lower chest: Lung bases are clear. Hepatobiliary: Several large cysts are demonstrated in the liver, largest in the dome measuring 5.9 cm greatest diameter. No change in appearance since previous study. Mild diffuse fatty infiltration of the liver. Gallbladder and bile ducts are unremarkable. Pancreas: Unremarkable. No pancreatic ductal dilatation or surrounding inflammatory changes. Spleen: Normal in size without focal abnormality. Adrenals/Urinary Tract: Adrenal glands are unremarkable. Kidneys are normal, without renal calculi, focal lesion, or hydronephrosis. Bladder is unremarkable. Stomach/Bowel: Stomach, small bowel, and colon are not abnormally distended. No wall thickening or inflammatory changes. The appendix is mildly dilated and fluid-filled, measuring 12 mm in diameter. Suggestion of a small appendicolith at the base. Mild stranding around the appendix. Changes are consistent with acute appendicitis. No abscess. Vascular/Lymphatic: No significant vascular findings are present. No enlarged abdominal or pelvic lymph  nodes. Reproductive: Uterus and bilateral adnexa are unremarkable. Other: No abdominal wall hernia or abnormality. No abdominopelvic ascites. Musculoskeletal: No acute or significant osseous findings. IMPRESSION: 1. Changes of acute appendicitis. No abscess. Small appendicoliths. Appendiceal diameter is 12 mm.  2. Stable appearance of hepatic cysts. No imaging follow-up is indicated. 3. Mild diffuse fatty infiltration of the liver. 4. No renal or ureteral stone or obstruction. Electronically Signed   By: Lucienne Capers M.D.   On: 02/16/2022 03:58    Procedures Procedures    Medications Ordered in ED Medications  piperacillin-tazobactam (ZOSYN) IVPB 3.375 g (has no administration in time range)  ondansetron (ZOFRAN) injection 4 mg (has no administration in time range)  fentaNYL (SUBLIMAZE) injection 50 mcg (has no administration in time range)    ED Course/ Medical Decision Making/ A&P                           Medical Decision Making Patient was in [place] moving her dad and developed right sided abdominal pain and drove home.  Associated nausea   Amount and/or Complexity of Data Reviewed Independent Historian: spouse    Details: See above  External Data Reviewed: notes.    Details: Previous notes reviewed  Labs: ordered.    Details: All labs reviewed:  lipase normal 39, normal sodium 136, normal potassium 4.1, normal creatinine .65 normal LFTS. Normal urine without UTI.  Elevated white count 12, normal hemoglobin 13.3, normal platelet counts.  Negative pregnancy test.   Radiology: ordered and independent interpretation performed.    Details: Appendix by me  Discussion of management or test interpretation with external provider(s): Dr. Rosendo Gros, go ED to ED for [facility] to see Dr. Roxanne Mins will accept   Risk Prescription drug management. Decision regarding hospitalization.    Final Clinical Impression(s) / ED Diagnoses Final diagnoses:  Appendicitis, unspecified appendicitis type   The  patient appears reasonably stabilized for admission considering the current resources, flow, and capabilities available in the ED at this time, and I doubt any other [facility] requiring further screening and/or treatment in the ED prior to admission.  Rx / DC Orders ED Discharge Orders     None         Cyrstal Leitz, MD 02/16/22 [phone]

## 2022-02-16 NOTE — H&P (Signed)
Sandra Mills is an 61 y.o. female.   Chief Complaint: Abdominal pain HPI: The patient is a 61 year old white female who began having right lower quadrant abdominal pain last night about 7 PM.  The pain has been associated with nausea and vomiting.  She denies any fevers or chills.  She went to the emergency department where a CT scan showed appendicitis.  Her white count was elevated at 12,000.  She is otherwise in pretty good health and does not smoke.  She plays piano for living.  Past Medical History:  Diagnosis Date   Anxiety    Colon polyps 12/09/2012   three polyps; [place]; repeat in 3-5 years.   Depression    IBS (irritable bowel syndrome)    diarrhea predominant; s/p GI consult with colonoscopy   Migraine    menstrually related.   Obesity    Tremor, essential     Past Surgical History:  Procedure Laterality Date   COLONOSCOPY W/ POLYPECTOMY  12/09/2012   three polyps; [place]. Repeat in 3-5 years.   Cyst resection perineal      Family History  Problem Relation Age of Onset   Breast cancer Mother 23       spread   Atrial fibrillation Father    Atrial fibrillation Sister    Breast cancer Other        maternal great aunt dx in her 67s   Breast cancer Other        6 maternal great aunts dx in their 37s   Colon cancer Neg Hx    Esophageal cancer Neg Hx    Social History:  reports that she has never smoked. She has never used smokeless tobacco. She reports current alcohol use. She reports that she does not use drugs.  Allergies: No Known Allergies  (Not in a hospital admission)   Results for orders placed or performed during the hospital encounter of 02/16/22 (from the past 48 hour(s))  Lipase, blood     Status: None   Collection Time: 02/16/22  2:40 AM  Result Value Ref Range   Lipase 39 11 - 51 U/L    Comment: Performed at [facility], [address] [place], [place] [address]  Comprehensive metabolic panel     Status: Abnormal   Collection Time:  02/16/22  2:40 AM  Result Value Ref Range   Sodium 136 135 - 145 mmol/L   Potassium 4.1 3.5 - 5.1 mmol/L   Chloride 107 98 - 111 mmol/L   CO2 23 22 - 32 mmol/L   Glucose, Bld 123 (H) 70 - 99 mg/dL    Comment: Glucose reference range applies only to samples taken after fasting for at least 8 hours.   BUN 14 6 - 20 mg/dL   Creatinine, Ser 0.65 0.44 - 1.00 mg/dL   Calcium 9.4 8.9 - 10.3 mg/dL   Total Protein 7.1 6.5 - 8.1 g/dL   Albumin 4.1 3.5 - 5.0 g/dL   AST 28 15 - 41 U/L   ALT 29 0 - 44 U/L   Alkaline Phosphatase 88 38 - 126 U/L   Total Bilirubin 0.7 0.3 - 1.2 mg/dL   GFR, Estimated >60 >60 mL/min    Comment: (NOTE) Calculated using the CKD-EPI Creatinine Equation (2021)    Anion gap 6 5 - 15    Comment: Performed at [facility], [address] [place], [place] [address]  Urinalysis, Routine w reflex microscopic Urine, Clean Catch  Status: Abnormal   Collection Time: 02/16/22  2:40 AM  Result Value Ref Range   Color, Urine YELLOW YELLOW   APPearance HAZY (A) CLEAR   Specific Gravity, Urine 1.020 1.005 - 1.030   pH 5.0 5.0 - 8.0   Glucose, UA NEGATIVE NEGATIVE mg/dL   Hgb urine dipstick NEGATIVE NEGATIVE   Bilirubin Urine NEGATIVE NEGATIVE   Ketones, ur NEGATIVE NEGATIVE mg/dL   Protein, ur NEGATIVE NEGATIVE mg/dL   Nitrite NEGATIVE NEGATIVE   Leukocytes,Ua SMALL (A) NEGATIVE    Comment: Performed at [facility], [address] [place], [place] [address]  CBC with Differential     Status: Abnormal   Collection Time: 02/16/22  2:40 AM  Result Value Ref Range   WBC 12.0 (H) 4.0 - 10.5 K/uL   RBC 4.39 3.87 - 5.11 MIL/uL   Hemoglobin 13.3 12.0 - 15.0 g/dL   HCT 38.9 36.0 - 46.0 %   MCV 88.6 80.0 - 100.0 fL   MCH 30.3 26.0 - 34.0 pg   MCHC 34.2 30.0 - 36.0 g/dL   RDW 13.8 11.5 - 15.5 %   Platelets 280 150 - 400 K/uL   nRBC 0.0 0.0 - 0.2 %   Neutrophils Relative % 73 %   Neutro Abs 8.8 (H) 1.7 - 7.7 K/uL   Lymphocytes Relative 21 %    Lymphs Abs 2.5 0.7 - 4.0 K/uL   Monocytes Relative 5 %   Monocytes Absolute 0.6 0.1 - 1.0 K/uL   Eosinophils Relative 1 %   Eosinophils Absolute 0.1 0.0 - 0.5 K/uL   Basophils Relative 0 %   Basophils Absolute 0.0 0.0 - 0.1 K/uL   Immature Granulocytes 0 %   Abs Immature Granulocytes 0.05 0.00 - 0.07 K/uL    Comment: Performed at [facility], [address] [place], [place] [address]  Pregnancy, urine     Status: None   Collection Time: 02/16/22  2:40 AM  Result Value Ref Range   Preg Test, Ur NEGATIVE NEGATIVE    Comment:        THE SENSITIVITY OF THIS METHODOLOGY IS >20 mIU/mL. Performed at [facility], [address] [place], [place] [address]   Urinalysis, Microscopic (reflex)     Status: Abnormal   Collection Time: 02/16/22  2:40 AM  Result Value Ref Range   RBC / HPF 0-5 0 - 5 RBC/hpf   WBC, UA 21-50 0 - 5 WBC/hpf   Bacteria, UA RARE (A) NONE SEEN   Squamous Epithelial / LPF 0-5 0 - 5    Comment: Performed at [facility], [address] [place], [place] [address]   CT Renal Stone Study  Result Date: 02/16/2022 CLINICAL DATA:  Flank pain. Kidney stones suspected. Right-sided abdominal pain. Nausea and diarrhea. EXAM: CT ABDOMEN AND PELVIS WITHOUT CONTRAST TECHNIQUE: Multidetector CT imaging of the abdomen and pelvis was performed following the standard protocol without IV contrast. RADIATION DOSE REDUCTION: This exam was performed according to the departmental dose-optimization program which includes automated exposure control, adjustment of the mA and/or kV according to patient size and/or use of iterative reconstruction technique. COMPARISON:  02/01/2021 FINDINGS: Lower chest: Lung bases are clear. Hepatobiliary: Several large cysts are demonstrated in the liver, largest in the dome measuring 5.9 cm greatest diameter. No change in appearance since previous study. Mild diffuse fatty infiltration of the liver. Gallbladder and bile ducts are  unremarkable. Pancreas: Unremarkable. No pancreatic ductal  dilatation or surrounding inflammatory changes. Spleen: Normal in size without focal abnormality. Adrenals/Urinary Tract: Adrenal glands are unremarkable. Kidneys are normal, without renal calculi, focal lesion, or hydronephrosis. Bladder is unremarkable. Stomach/Bowel: Stomach, small bowel, and colon are not abnormally distended. No wall thickening or inflammatory changes. The appendix is mildly dilated and fluid-filled, measuring 12 mm in diameter. Suggestion of a small appendicolith at the base. Mild stranding around the appendix. Changes are consistent with acute appendicitis. No abscess. Vascular/Lymphatic: No significant vascular findings are present. No enlarged abdominal or pelvic lymph nodes. Reproductive: Uterus and bilateral adnexa are unremarkable. Other: No abdominal wall hernia or abnormality. No abdominopelvic ascites. Musculoskeletal: No acute or significant osseous findings. IMPRESSION: 1. Changes of acute appendicitis. No abscess. Small appendicoliths. Appendiceal diameter is 12 mm. 2. Stable appearance of hepatic cysts. No imaging follow-up is indicated. 3. Mild diffuse fatty infiltration of the liver. 4. No renal or ureteral stone or obstruction. Electronically Signed   By: Lucienne Capers M.D.   On: 02/16/2022 03:58    Review of Systems  Constitutional: Negative.   HENT: Negative.    Eyes: Negative.   Respiratory: Negative.    Cardiovascular: Negative.   Gastrointestinal:  Positive for abdominal pain, nausea and vomiting.  Endocrine: Negative.   Genitourinary: Negative.   Musculoskeletal: Negative.   Skin: Negative.   Allergic/Immunologic: Negative.   Neurological: Negative.   Hematological: Negative.   Psychiatric/Behavioral: Negative.      Blood pressure 121/78, pulse 71, temperature 97.7 F (36.5 C), temperature source Oral, resp. rate 18, height 5\' 2"  (1.575 m), weight 83.9 kg, SpO2 98 %. Physical Exam Vitals  reviewed.  Constitutional:      General: She is not in acute distress.    Appearance: Normal appearance. She is obese.  HENT:     Head: Normocephalic and atraumatic.     Right Ear: External ear normal.     Left Ear: External ear normal.     Nose: Nose normal.     Mouth/Throat:     Mouth: Mucous membranes are dry.     Pharynx: Oropharynx is clear.  Eyes:     General: No scleral icterus.    Extraocular Movements: Extraocular movements intact.     Conjunctiva/sclera: Conjunctivae normal.     Pupils: Pupils are equal, round, and reactive to light.  Cardiovascular:     Rate and Rhythm: Normal rate and regular rhythm.     Pulses: Normal pulses.     Heart sounds: Normal heart sounds.  Pulmonary:     Effort: Pulmonary effort is normal. No respiratory distress.     Breath sounds: Normal breath sounds.  Abdominal:     General: There is no distension.     Palpations: Abdomen is soft.     Tenderness: There is abdominal tenderness.  Musculoskeletal:        General: No swelling or deformity. Normal range of motion.     Cervical back: Normal range of motion and neck supple.  Skin:    General: Skin is warm and dry.     Coloration: Skin is not jaundiced.  Neurological:     General: No focal deficit present.     Mental Status: She is alert and oriented to person, place, and time.  Psychiatric:        Mood and Affect: Mood normal.        Behavior: Behavior normal.      Assessment/Plan The patient appears to have acute appendicitis.  Because of the risk of  perforation and sepsis I feel she would benefit from having her appendix removed.  She would also like to have this done.  I have discussed with her in detail the risks and benefits of the operation as well as some of the technical aspects including the risk of leak and she understands and wishes to proceed.  We will admit her to the hospital and start her on broad-spectrum antibiotic therapy.  We will plan for surgery later today as the  schedule allows.  Autumn Messing III, MD 02/16/2022, 9:16 AM

## 2022-02-16 NOTE — Op Note (Signed)
02/16/2022  6:35 PM  PATIENT:  Sandra Mills  61 y.o. female  PRE-OPERATIVE DIAGNOSIS:  acute appendicitis  POST-OPERATIVE DIAGNOSIS:  acute appendicitis  PROCEDURE:  Procedure(s): APPENDECTOMY LAPAROSCOPIC (N/A)  SURGEON:  Surgeon(s) and Role:    * Jovita Kussmaul, MD - Primary  PHYSICIAN ASSISTANT:   ASSISTANTS: none   ANESTHESIA:   local and general  EBL:  10 mL   BLOOD ADMINISTERED:none  DRAINS: none   LOCAL MEDICATIONS USED:  MARCAINE     SPECIMEN:  Source of Specimen:  appendix  DISPOSITION OF SPECIMEN:  PATHOLOGY  COUNTS:  YES  TOURNIQUET:  * No tourniquets in log *  DICTATION: .Dragon Dictation  After informed consent was obtained patient was brought to the operating room placed in the supine position on the operating room table. After adequate induction of general anesthesia the patient's abdomen was prepped with ChloraPrep, allowed to dry, and draped in usual sterile manner. The area below the umbilicus was infiltrated with quarter percent Marcaine. A small incision was made with a 15 blade knife. This incision was carried down through the subcutaneous tissue bluntly with a hemostat and [employer] retractors until the linea alba was identified. The linea alba was incised with a 15 blade knife. Each side was grasped Coker clamps and elevated anteriorly. The preperitoneal space was probed bluntly with a hemostat until the peritoneum was opened and access was gained to the abdominal cavity. A 0 Vicryl purse string stitch was placed in the fascia surrounding the opening. A Hassan cannula was placed through the opening and anchored in place with the previously placed Vicryl purse string stitch. The laparoscope was placed through the [facility] cannula. The abdomen was insufflated with carbon dioxide without difficulty. Next the suprapubic area was infiltrated with quarter percent Marcaine. A small incision was made with a 15 blade knife. A 5 mm port was placed bluntly  through this incision into the abdominal cavity. A site was then chosen in the LUQ for placement of a 5 mm port. The area was infiltrated with quarter percent Marcaine. A small stab incision was made with a 15 blade knife. A 5 mm port was placed bluntly through this incision and the abdominal cavity under direct vision. The laparoscope was then moved to the suprapubic port. Using a Glassman grasper and harmonic scalpel the right lower quadrant was inspected. The appendix was readily identified. The appendix was elevated anteriorly and the mesoappendix was taken down sharply with the harmonic scalpel. Once the base of the appendix where it joined the cecum was identified and cleared of any tissue then a laparoscopic GIA blue load 6 row stapler was placed through the [facility] cannula. The stapler was placed across the base of the appendix clamped and fired thereby dividing the base of the appendix between staple lines. Some small bleeding along the staple line was controlled with clips. A laparoscopic bag was then inserted through the [facility] cannula. The appendix was placed within the bag and the bag was sealed. The abdomen was then irrigated with copious amounts of saline until the effluent was clear. No other abnormalities were noted. The appendix and bag were removed with the [facility] cannula through the infraumbilical port without difficulty. The fascial defect was closed with the previously placed Vicryl pursestring stitch as well as with another interrupted 0 Vicryl figure-of-eight stitch. The rest of the ports were removed under direct vision and were found to be hemostatic. The gas was allowed to escape. The skin incisions were closed  with interrupted 4-0 Monocryl subcuticular stitches. Dermabond dressings were applied. The patient tolerated the procedure well. At the end of the case all needle sponge and instrument counts were correct. The patient was then awakened and taken to recovery in stable condition.    PLAN OF CARE: Admit for overnight observation  PATIENT DISPOSITION:  PACU - hemodynamically stable.   Delay start of Pharmacological VTE agent (>24hrs) due to surgical blood loss or risk of bleeding: no

## 2022-02-16 NOTE — Anesthesia Postprocedure Evaluation (Signed)
Anesthesia Post Note  Patient: Sandra Mills  Procedure(s) Performed: APPENDECTOMY LAPAROSCOPIC (Abdomen)     Patient location during evaluation: PACU Anesthesia Type: General Level of consciousness: awake Pain management: pain level controlled Vital Signs Assessment: post-procedure vital signs reviewed and stable Respiratory status: spontaneous breathing Cardiovascular status: stable Postop Assessment: no apparent nausea or vomiting Anesthetic complications: no   No notable events documented.  Last Vitals:  Vitals:   02/16/22 1915 02/16/22 1930  BP: 116/71   Pulse: 88 75  Resp: 14 13  Temp: 36.7 C   SpO2: 98% 98%    Last Pain:  Vitals:   02/16/22 1930  TempSrc:   PainSc: 0-No pain                 Verneice Caspers

## 2022-02-16 NOTE — Transfer of Care (Signed)
Immediate Anesthesia Transfer of Care Note  Patient: Sandra Mills  Procedure(s) Performed: APPENDECTOMY LAPAROSCOPIC (Abdomen)  Patient Location: PACU  Anesthesia Type:General  Level of Consciousness: awake, alert , oriented and patient cooperative  Airway & Oxygen Therapy: Patient Spontanous Breathing and Patient connected to face mask oxygen  Post-op Assessment: Report given to RN and Post -op Vital signs reviewed and stable  Post vital signs: Reviewed and stable  Last Vitals:  Vitals Value Taken Time  BP 134/69 02/16/22 1839  Temp 36.7 C 02/16/22 1839  Pulse 85 02/16/22 1842  Resp 10 02/16/22 1842  SpO2 90 % 02/16/22 1842  Vitals shown include unvalidated device data.  Last Pain:  Vitals:   02/16/22 1324  TempSrc:   PainSc: 0-No pain      Patients Stated Pain Goal: 3 (57/47/34 0370)  Complications: No notable events documented.

## 2022-02-17 ENCOUNTER — Encounter (HOSPITAL_COMMUNITY): Payer: Self-pay | Admitting: General Surgery

## 2022-02-17 MED ORDER — ACETAMINOPHEN 500 MG PO TABS: 1000.0000 mg | ORAL_TABLET | Freq: Four times a day (QID) | ORAL | 2 refills | Status: DC | PRN

## 2022-02-17 MED ORDER — OXYCODONE HCL 5 MG PO TABS: 5.0000 mg | ORAL_TABLET | ORAL | 0 refills | Status: DC | PRN

## 2022-02-17 NOTE — Discharge Summary (Signed)
    Patient ID: Sandra Mills [id number] [birth year] 61 y.o.  Admit date: 02/16/2022 Discharge date: 02/17/2022  Admitting Diagnosis: Acute appendicitis  Discharge Diagnosis Patient Active Problem List   Diagnosis Date Noted   Appendicitis 02/16/2022   Generalized anxiety disorder 02/27/2017   Recurrent major depressive disorder, in full remission ([place]) 12/03/2016   Migraine, menstrual 06/23/2007  S/p lap appy  Consultants none  Reason for Admission: The patient is a 61 year old white female who began having right lower quadrant abdominal pain last night about 7 PM.  The pain has been associated with nausea and vomiting.  She denies any fevers or chills.  She went to the emergency department where a CT scan showed appendicitis.  Her white count was elevated at 12,000.  She is otherwise in pretty good health and does not smoke.  She plays piano for living.  Procedures Lap appy, Dr. Marlou Starks 02/16/22  Hospital Course:  The patient was admitted and underwent a laparoscopic appendectomy.  The patient tolerated the procedure well.  On POD 1, the patient was tolerating a regular diet, voiding well, mobilizing, and pain was controlled with oral pain medications.  The patient was stable for DC home at this time with appropriate follow up made.   Physical Exam: Abd: soft, appropriately tender, +BS, ND, incisions c/d/i  Allergies as of 02/17/2022   No Known Allergies      Medication List     TAKE these medications    acetaminophen 500 MG tablet Commonly known as: TYLENOL Take 2 tablets (1,000 mg total) by mouth every 6 (six) hours as needed.   cetirizine 10 MG tablet Commonly known as: ZYRTEC Take 1 tablet (10 mg total) by mouth daily.   fluticasone 50 MCG/ACT nasal spray Commonly known as: FLONASE PLACE 1 SPRAY INTO BOTH NOSTRILS 2 (TWO) TIMES DAILY. What changed:  when to take this reasons to take this   gabapentin 100 MG capsule Commonly known as: NEURONTIN TAKE  2 CAPSULES BY MOUTH TWICE A DAY What changed: when to take this   Mounjaro 2.5 MG/0.5ML Pen Generic drug: tirzepatide Inject 2.5 mg into the skin once a week.   oxyCODONE 5 MG immediate release tablet Commonly known as: Roxicodone Take 1 tablet (5 mg total) by mouth every 4 (four) hours as needed.   Ozempic (0.25 or 0.5 MG/DOSE) 2 MG/1.5ML Sopn Generic drug: Semaglutide(0.25 or 0.5MG /DOS) Inject 0.25 mg into the skin once a week.   sertraline 50 MG tablet Commonly known as: ZOLOFT TAKE 3 TABLETS BY MOUTH EVERY DAY What changed: how much to take          Follow-up Information     Maczis, Carlena Hurl, PA-C Follow up in 3 week(s).   Specialty: General Surgery Why: Office will call you with a follow up appointment, If you don't hear from the office, please call, Arrive 30 minutes prior to your appointment time, Please bring your insurance card and photo ID Contact information: [facility] [place] [address] [phone]                 Signed: Saverio Danker, [facility] 02/17/2022, 10:30 AM Please see Amion for pager number during day hours 7:00am-4:30pm, 7-11:30am on Weekends

## 2022-02-17 NOTE — Progress Notes (Signed)
  Transition of Care [facility]) Screening Note   Patient Details  Name: Sandra Mills Date of Birth: 1960/05/01   Transition of Care [facility]) CM/SW Contact:    Lennart Pall, LCSW Phone Number: 02/17/2022, 10:04 AM    Transition of Care Department [facility]) has reviewed patient and no TOC needs have been identified at this time. We will continue to monitor patient advancement through interdisciplinary progression rounds. If new patient transition needs arise, please place a TOC consult.

## 2022-02-17 NOTE — Discharge Instructions (Signed)
[facility] ° °Please arrive at least 30 min before your appointment to complete your check in paperwork.  If you are unable to arrive 30 min prior to your appointment time we may have to cancel or reschedule you. °LAPAROSCOPIC SURGERY: POST OP INSTRUCTIONS °Always review your discharge instruction sheet given to you by the facility where your surgery was performed. °IF YOU HAVE DISABILITY OR FAMILY LEAVE FORMS, YOU MUST BRING THEM TO THE OFFICE FOR PROCESSING.   °DO NOT GIVE THEM TO YOUR DOCTOR. ° °PAIN CONTROL ° °First take acetaminophen (Tylenol) AND/or ibuprofen (Advil) to control your pain after surgery.  Follow directions on package.  Taking acetaminophen (Tylenol) and/or ibuprofen (Advil) regularly after surgery will help to control your pain and lower the amount of prescription pain medication you may need.  You should not take more than 4,000 mg (4 grams) of acetaminophen (Tylenol) in 24 hours.  You should not take ibuprofen (Advil), aleve, motrin, naprosyn or other NSAIDS if you have a history of stomach ulcers or chronic kidney disease.  °A prescription for pain medication may be given to you upon discharge.  Take your pain medication as prescribed, if you still have uncontrolled pain after taking acetaminophen (Tylenol) or ibuprofen (Advil). °Use ice packs to help control pain. °If you need a refill on your pain medication, please contact your pharmacy.  They will contact our office to request authorization. Prescriptions will not be filled after 5pm or on week-ends. ° °HOME MEDICATIONS °Take your usually prescribed medications unless otherwise directed. ° °DIET °You should follow a light diet the first few days after arrival home.  Be sure to include lots of fluids daily. Avoid fatty, fried foods.  ° °CONSTIPATION °It is common to experience some constipation after surgery and if you are taking pain medication.  Increasing fluid intake and taking a stool softener (such as Colace)  will usually help or prevent this problem from occurring.  A mild laxative (Milk of Magnesia or Miralax) should be taken according to package instructions if there are no bowel movements after 48 hours. ° °WOUND/INCISION CARE °Most patients will experience some swelling and bruising in the area of the incisions.  Ice packs will help.  Swelling and bruising can take several days to resolve.  °Unless discharge instructions indicate otherwise, follow guidelines below  °STERI-STRIPS - you may remove your outer bandages 48 hours after surgery, and you may shower at that time.  You have steri-strips (small skin tapes) in place directly over the incision.  These strips should be left on the skin for 7-10 days.   °DERMABOND/SKIN GLUE - you may shower in 24 hours.  The glue will flake off over the next 2-3 weeks. °Any sutures or staples will be removed at the office during your follow-up visit. ° °ACTIVITIES °You may resume regular (light) daily activities beginning the next day--such as daily self-care, walking, climbing stairs--gradually increasing activities as tolerated.  You may have sexual intercourse when it is comfortable.  Refrain from any heavy lifting or straining until approved by your doctor. °You may drive when you are no longer taking prescription pain medication, you can comfortably wear a seatbelt, and you can safely maneuver your car and apply brakes. ° °FOLLOW-UP °You should see your doctor in the office for a follow-up appointment approximately 2-3 weeks after your surgery.  You should have been given your post-op/follow-up appointment when your surgery was scheduled.  If you did not receive a post-op/follow-up appointment, make sure   that you call for this appointment within a day or two after you arrive home to insure a convenient appointment time. ° ° °WHEN TO CALL YOUR DOCTOR: °Fever over 101.0 °Inability to urinate °Continued bleeding from incision. °Increased pain, redness, or drainage from the  incision. °Increasing abdominal pain ° °The clinic staff is available to answer your questions during regular business hours.  Please don’t hesitate to call and ask to speak to one of the nurses for clinical concerns.  If you have a medical emergency, go to the nearest emergency room or call 911.  A surgeon from [facility] is always on call at the hospital. °[address], [address], [place], [place]  [address] ? [address] [address], [place]   [address] °[phone] ? [phone] ? FAX [phone] ° ° ° ° °Managing Your Pain After Surgery Without Opioids ° ° ° °Thank you for participating in our program to help patients manage their pain after surgery without opioids. This is part of our effort to provide you with the best care possible, without exposing you or your family to the risk that opioids pose. ° °What pain can I expect after surgery? °You can expect to have some pain after surgery. This is normal. The pain is typically worse the day after surgery, and quickly begins to get better. °Many studies have found that many patients are able to manage their pain after surgery with Over-the-Counter (OTC) medications such as Tylenol and Motrin. If you have a condition that does not allow you to take Tylenol or Motrin, notify your surgical team. ° °How will I manage my pain? °The best strategy for controlling your pain after surgery is around the clock pain control with Tylenol (acetaminophen) and Motrin (ibuprofen or Advil). Alternating these medications with each other allows you to maximize your pain control. In addition to Tylenol and Motrin, you can use heating pads or ice packs on your incisions to help reduce your pain. ° °How will I alternate your regular strength over-the-counter pain medication? °You will take a dose of pain medication every three hours. °Start by taking 650 mg of Tylenol (2 pills of 325 mg) °3 hours later take 600 mg of Motrin (3 pills of 200 mg) °3 hours after  taking the Motrin take 650 mg of Tylenol °3 hours after that take 600 mg of Motrin. ° ° °- 1 - ° °See example - if your first dose of Tylenol is at 12:00 PM ° ° °12:00 PM Tylenol 650 mg (2 pills of 325 mg)  °3:00 PM Motrin 600 mg (3 pills of 200 mg)  °6:00 PM Tylenol 650 mg (2 pills of 325 mg)  °9:00 PM Motrin 600 mg (3 pills of 200 mg)  °Continue alternating every 3 hours  ° °We recommend that you follow this schedule around-the-clock for at least 3 days after surgery, or until you feel that it is no longer needed. Use the table on the last page of this handout to keep track of the medications you are taking. °Important: °Do not take more than 3000mg of Tylenol or 3200mg of Motrin in a 24-hour period. °Do not take ibuprofen/Motrin if you have a history of bleeding stomach ulcers, severe kidney disease, &/or actively taking a blood thinner ° °What if I still have pain? °If you have pain that is not controlled with the over-the-counter pain medications (Tylenol and Motrin or Advil) you might have what we call “breakthrough” pain. You will receive a prescription   for a small amount of an opioid pain medication such as Oxycodone, Tramadol, or Tylenol with Codeine. Use these opioid pills in the first 24 hours after surgery if you have breakthrough pain. Do not take more than 1 pill every 4-6 hours. ° °If you still have uncontrolled pain after using all opioid pills, don't hesitate to call our staff using the number provided. We will help make sure you are managing your pain in the best way possible, and if necessary, we can provide a prescription for additional pain medication. ° ° °Day 1   ° °Time  °Name of Medication Number of pills taken  °Amount of Acetaminophen  °Pain Level  ° °Comments  °AM PM       °AM PM       °AM PM       °AM PM       °AM PM       °AM PM       °AM PM       °AM PM       °Total Daily amount of Acetaminophen °Do not take more than  3,000 mg per day    ° ° °Day 2   ° °Time  °Name of Medication  Number of pills °taken  °Amount of Acetaminophen  °Pain Level  ° °Comments  °AM PM       °AM PM       °AM PM       °AM PM       °AM PM       °AM PM       °AM PM       °AM PM       °Total Daily amount of Acetaminophen °Do not take more than  3,000 mg per day    ° ° °Day 3   ° °Time  °Name of Medication Number of pills taken  °Amount of Acetaminophen  °Pain Level  ° °Comments  °AM PM       °AM PM       °AM PM       °AM PM       ° ° ° °AM PM       °AM PM       °AM PM       °AM PM       °Total Daily amount of Acetaminophen °Do not take more than  3,000 mg per day    ° ° °Day 4   ° °Time  °Name of Medication Number of pills taken  °Amount of Acetaminophen  °Pain Level  ° °Comments  °AM PM       °AM PM       °AM PM       °AM PM       °AM PM       °AM PM       °AM PM       °AM PM       °Total Daily amount of Acetaminophen °Do not take more than  3,000 mg per day    ° ° °Day 5   ° °Time  °Name of Medication Number °of pills taken  °Amount of Acetaminophen  °Pain Level  ° °Comments  °AM PM       °AM PM       °AM PM       °AM PM       °AM PM       °AM   PM       °AM PM       °AM PM       °Total Daily amount of Acetaminophen °Do not take more than  3,000 mg per day    ° ° ° °Day 6   ° °Time  °Name of Medication Number of pills °taken  °Amount of Acetaminophen  °Pain Level  °Comments  °AM PM       °AM PM       °AM PM       °AM PM       °AM PM       °AM PM       °AM PM       °AM PM       °Total Daily amount of Acetaminophen °Do not take more than  3,000 mg per day    ° ° °Day 7   ° °Time  °Name of Medication Number of pills taken  °Amount of Acetaminophen  °Pain Level  ° °Comments  °AM PM       °AM PM       °AM PM       °AM PM       °AM PM       °AM PM       °AM PM       °AM PM       °Total Daily amount of Acetaminophen °Do not take more than  3,000 mg per day    ° ° ° ° °For additional information about how and where to safely dispose of unused opioid °medications - https://www.morepowerfulnc.org ° °Disclaimer: This document  contains information and/or instructional materials adapted from [facility] for the typical patient with your condition. It does not replace medical advice from your health care provider because your experience may differ from that of the °typical patient. Talk to your health care provider if you have any questions about this °document, your condition or your treatment plan. °Adapted from [facility] ° °

## 2022-02-17 NOTE — Plan of Care (Signed)
  Problem: Health Behavior/Discharge Planning: Goal: Ability to manage health-related needs will improve Outcome: Completed/Met   Problem: Clinical Measurements: Goal: Ability to maintain clinical measurements within normal limits will improve Outcome: Completed/Met Goal: Will remain free from infection Outcome: Completed/Met Goal: Diagnostic test results will improve Outcome: Completed/Met Goal: Respiratory complications will improve Outcome: Completed/Met Goal: Cardiovascular complication will be avoided Outcome: Completed/Met   Problem: Activity: Goal: Risk for activity intolerance will decrease 02/17/2022 1459 by Iver Nestle A, LPN Outcome: Completed/Met 02/17/2022 1144 by Iver Nestle A, LPN Outcome: Progressing   Problem: Nutrition: Goal: Adequate nutrition will be maintained 02/17/2022 1459 by Iver Nestle A, LPN Outcome: Completed/Met 02/17/2022 1144 by Lise Auer, LPN Outcome: Progressing   Problem: Coping: Goal: Level of anxiety will decrease Outcome: Completed/Met   Problem: Elimination: Goal: Will not experience complications related to bowel motility Outcome: Completed/Met Goal: Will not experience complications related to urinary retention Outcome: Completed/Met   Problem: Pain Managment: Goal: General experience of comfort will improve 02/17/2022 1459 by Iver Nestle A, LPN Outcome: Completed/Met 02/17/2022 1144 by Iver Nestle A, LPN Outcome: Progressing   Problem: Safety: Goal: Ability to remain free from injury will improve 02/17/2022 1459 by Lise Auer, LPN Outcome: Completed/Met 02/17/2022 1144 by Lise Auer, LPN Outcome: Progressing

## 2022-02-17 NOTE — Plan of Care (Signed)
  Problem: Activity: Goal: Risk for activity intolerance will decrease Outcome: Progressing   Problem: Nutrition: Goal: Adequate nutrition will be maintained Outcome: Progressing   Problem: Pain Managment: Goal: General experience of comfort will improve Outcome: Progressing   Problem: Safety: Goal: Ability to remain free from injury will improve Outcome: Progressing   

## 2022-02-17 NOTE — Progress Notes (Signed)
Provided discharge education/instructions, all questions and concerns addressed. Pt not in acute distress, discharged home with belongings accompanied by husband. 

## 2022-02-18 LAB — SURGICAL PATHOLOGY

## 2022-03-03 ENCOUNTER — Ambulatory Visit (INDEPENDENT_AMBULATORY_CARE_PROVIDER_SITE_OTHER): Payer: BC Managed Care – PPO | Admitting: Family

## 2022-03-03 ENCOUNTER — Other Ambulatory Visit: Payer: Self-pay | Admitting: Family

## 2022-03-03 ENCOUNTER — Encounter: Payer: Self-pay | Admitting: Family

## 2022-03-03 VITALS — BP 125/86 | HR 92 | Temp 97.8°F | Ht 62.0 in | Wt 198.0 lb

## 2022-03-03 DIAGNOSIS — R300 Dysuria: Secondary | ICD-10-CM | POA: Diagnosis not present

## 2022-03-03 DIAGNOSIS — R197 Diarrhea, unspecified: Secondary | ICD-10-CM

## 2022-03-03 DIAGNOSIS — A0472 Enterocolitis due to Clostridium difficile, not specified as recurrent: Secondary | ICD-10-CM | POA: Diagnosis not present

## 2022-03-03 LAB — POCT URINALYSIS DIPSTICK
Bilirubin, UA: NEGATIVE
Blood, UA: NEGATIVE
Glucose, UA: NEGATIVE
Ketones, UA: NEGATIVE
Leukocytes, UA: NEGATIVE
Nitrite, UA: NEGATIVE
Protein, UA: NEGATIVE
Spec Grav, UA: <=1.005 — AB (ref 1.010–1.025)
Urobilinogen, UA: 0.2 E.U./dL
pH, UA: 6 (ref 5.0–8.0)

## 2022-03-03 MED ORDER — DIPHENOXYLATE-ATROPINE 2.5-0.025 MG PO TABS: 1.0000 | ORAL_TABLET | Freq: Four times a day (QID) | ORAL | 0 refills | Status: DC | PRN

## 2022-03-03 NOTE — Progress Notes (Signed)
Patient ID: Sandra Mills, female    DOB: 1960-09-21, 61 y.o.   MRN: [id number]  Chief Complaint  Patient presents with   Dysuria    Pt c/o burning during urination, diarrhea and tender abdomen. Present for the past couple of days. Has tried tylenol and ibuprofen.     HPI:      Dysuria & diarrhea: pt had appendectomy 2 weeks ago, had catheter and told the dysuria would last for a time, she thinks it has somewhat improved but still persisting. Denies any vaginitis sx. The Diarrhea has persisted since her surgery, she reports having 8-10 watery stools per day, denies any cramping pain, occasional nausea. She has not tried any OTC meds and has tried to eat differently, but has not slowed down the diarrhea.  Assessment & Plan:  1. Dysuria sx since catheterization during appendectomy. Advised her UA is negative, hopefully will be resolving soon.  - POCT Urinalysis Dipstick  2. Diarrhea of presumed infectious origin sending Lomotil, advised on use & SE. Advised to not start until after she is able to get a sample for testing. Advised on BRAT diet, & getting plenty of liquids. VSS, pt reports having a surgical f/u appt in a few days.   - Salmonella/Shigella Cult, Campy EIA and Shiga Toxin reflex; Future - Fecal lactoferrin, quant; Future - C. difficile GDH and Toxin A/B; Future - Stool culture; Future - diphenoxylate-atropine (LOMOTIL) 2.5-0.025 MG tablet; Take 1 tablet by mouth 4 (four) times daily as needed for diarrhea or loose stools.  Dispense: 30 tablet; Refill: 0   Subjective:    Outpatient Medications Prior to Visit  Medication Sig Dispense Refill   acetaminophen (TYLENOL) 500 MG tablet Take 2 tablets (1,000 mg total) by mouth every 6 (six) hours as needed. 100 tablet 2   cetirizine (ZYRTEC) 10 MG tablet Take 1 tablet (10 mg total) by mouth daily. 30 tablet 11   fluticasone (FLONASE) 50 MCG/ACT nasal spray PLACE 1 SPRAY INTO BOTH NOSTRILS 2 (TWO) TIMES DAILY. (Patient taking  differently: Place 1 spray into both nostrils 2 (two) times daily as needed for allergies.) 48 mL 2   gabapentin (NEURONTIN) 100 MG capsule TAKE 2 CAPSULES BY MOUTH TWICE A DAY (Patient taking differently: Take 200 mg by mouth daily.) 360 capsule 0   oxyCODONE (ROXICODONE) 5 MG immediate release tablet Take 1 tablet (5 mg total) by mouth every 4 (four) hours as needed. 15 tablet 0   sertraline (ZOLOFT) 50 MG tablet TAKE 3 TABLETS BY MOUTH EVERY DAY (Patient taking differently: Take 100 mg by mouth daily.) 270 tablet 3   tirzepatide (MOUNJARO) 2.5 MG/0.5ML Pen Inject 2.5 mg into the skin once a week. 2 mL 0   No facility-administered medications prior to visit.   Past Medical History:  Diagnosis Date   Anxiety    Colon polyps 12/09/2012   three polyps; [facility]; repeat in 3-5 years.   Depression    IBS (irritable bowel syndrome)    diarrhea predominant; s/p GI consult with colonoscopy   Migraine    menstrually related.   Obesity    Tremor, essential    Past Surgical History:  Procedure Laterality Date   COLONOSCOPY W/ POLYPECTOMY  12/09/2012   three polyps; [facility]. Repeat in 3-5 years.   Cyst resection perineal     LAPAROSCOPIC APPENDECTOMY N/A 02/16/2022   Procedure: APPENDECTOMY LAPAROSCOPIC;  Surgeon: Griselda Miner, MD;  Location: [facility];  Service: General;  Laterality: N/A;   No Known  Allergies    Objective:    Physical Exam Vitals and nursing note reviewed.  Constitutional:      Appearance: Normal appearance.  Cardiovascular:     Rate and Rhythm: Normal rate and regular rhythm.  Pulmonary:     Effort: Pulmonary effort is normal.     Breath sounds: Normal breath sounds.  Musculoskeletal:        General: Normal range of motion.  Skin:    General: Skin is warm and dry.  Neurological:     Mental Status: She is alert.  Psychiatric:        Mood and Affect: Mood normal.        Behavior: Behavior normal.    BP 125/86 (BP Location: Left Arm, Patient Position: Sitting, Cuff  Size: Large)   Pulse 92   Temp 97.8 F (36.6 C) (Temporal)   Ht 5\' 2"  (1.575 m)   Wt 198 lb (89.8 kg)   SpO2 95%   BMI 36.21 kg/m  Wt Readings from Last 3 Encounters:  03/03/22 198 lb (89.8 kg)  02/16/22 185 lb (83.9 kg)  06/20/21 189 lb 5.8 oz (85.9 kg)       Jeanie Sewer, NP

## 2022-03-04 MED ORDER — DIPHENOXYLATE-ATROPINE 2.5-0.025 MG PO TABS: 1.0000 | ORAL_TABLET | Freq: Four times a day (QID) | ORAL | 0 refills | Status: DC | PRN

## 2022-03-05 MED ORDER — FIDAXOMICIN 200 MG PO TABS: 200.0000 mg | ORAL_TABLET | Freq: Two times a day (BID) | ORAL | 0 refills | Status: DC

## 2022-03-05 NOTE — Addendum Note (Signed)
Addended byDulce Sellar on: 03/05/2022 10:56 PM   Modules accepted: Orders

## 2022-03-05 NOTE — Progress Notes (Signed)
Your stool specimen is positive for Clostridium Difficile, this is a common bacteria found in some hospital or post-hospital patients and/or it tends to grow after taking certain antibiotics. To treat this, I have sent an antibiotic, Fidoxomacin, to your pharmacy. Take this twice a day 20-102minutes after eating, as nausea is a side effect. You should notice an improvement in your diarrhea, Remember to continue to hydrate well. All of your stool testing is not complete, so I will send you another message when they have resulted. Any questions, let me know.

## 2022-03-06 ENCOUNTER — Encounter: Payer: Self-pay | Admitting: Family

## 2022-03-06 LAB — SPECIMEN STATUS REPORT

## 2022-03-10 LAB — FECAL LACTOFERRIN, QUANT
Fecal Lactoferrin: POSITIVE — AB
MICRO NUMBER:: 14155152
SPECIMEN QUALITY:: ADEQUATE

## 2022-03-10 LAB — SALMONELLA/SHIGELLA CULT, CAMPY EIA AND SHIGA TOXIN RFL ECOLI
MICRO NUMBER: 14155149
MICRO NUMBER:: 14155151
MICRO NUMBER:: 14155153
Result:: NOT DETECTED
SHIGA RESULT:: NOT DETECTED
SPECIMEN QUALITY: ADEQUATE
SPECIMEN QUALITY:: ADEQUATE
SPECIMEN QUALITY:: ADEQUATE

## 2022-03-10 LAB — C. DIFFICILE GDH AND TOXIN A/B
GDH ANTIGEN: DETECTED
MICRO NUMBER:: 14155150
SPECIMEN QUALITY:: ADEQUATE
TOXIN A AND B: DETECTED

## 2022-03-28 LAB — STOOL CULTURE

## 2022-04-10 ENCOUNTER — Encounter: Payer: Self-pay | Admitting: *Deleted

## 2022-06-10 ENCOUNTER — Encounter: Payer: Self-pay | Admitting: Physician Assistant

## 2022-06-11 ENCOUNTER — Other Ambulatory Visit: Payer: Self-pay

## 2022-06-11 DIAGNOSIS — F3342 Major depressive disorder, recurrent, in full remission: Secondary | ICD-10-CM

## 2022-06-11 DIAGNOSIS — F411 Generalized anxiety disorder: Secondary | ICD-10-CM

## 2022-06-11 MED ORDER — SERTRALINE HCL 50 MG PO TABS: 150.0000 mg | ORAL_TABLET | Freq: Every day | ORAL | 3 refills | Status: DC

## 2022-06-11 MED ORDER — GABAPENTIN 100 MG PO CAPS: 200.0000 mg | ORAL_CAPSULE | Freq: Two times a day (BID) | ORAL | 0 refills | Status: DC

## 2022-09-30 ENCOUNTER — Encounter: Payer: Self-pay | Admitting: Family

## 2022-09-30 NOTE — Telephone Encounter (Signed)
Contacted pt and scheduled for next available

## 2022-09-30 NOTE — Telephone Encounter (Signed)
Please schedule patient for annual CPE and fasting labs, all other needs and orders will be addressed at appt with PCP. Thanks

## 2022-10-09 IMAGING — US US ABDOMEN COMPLETE W/ ELASTOGRAPHY
2 series · 12 of 25 positions shown · non-contrast
Comparison: CT February 01, 2021

CLINICAL DATA: Hepatic steatosis

EXAM:
ULTRASOUND ABDOMEN
ULTRASOUND HEPATIC ELASTOGRAPHY
TECHNIQUE: Sonography of the upper abdomen was performed. In addition,
ultrasound elastography evaluation of the liver was performed. A
region of interest was placed within the right lobe of the liver.
Following application of a compressive sonographic pulse, tissue
compressibility was assessed. Multiple assessments were performed at
the selected site. Median tissue compressibility was determined.
Previously, hepatic stiffness was assessed by shear wave velocity.
Based on recently published Society of Radiologists in Ultrasound
consensus article, reporting is now recommended to be performed in
the SI units of pressure (kiloPascals) representing hepatic
stiffness/elasticity. The obtained result is compared to the
published reference standards. (cACLD = compensated Advanced Chronic
Liver Disease)

[Series 1: us abdomen complete w/elastography · 10 of 97 slices shown]
[im 6/97]
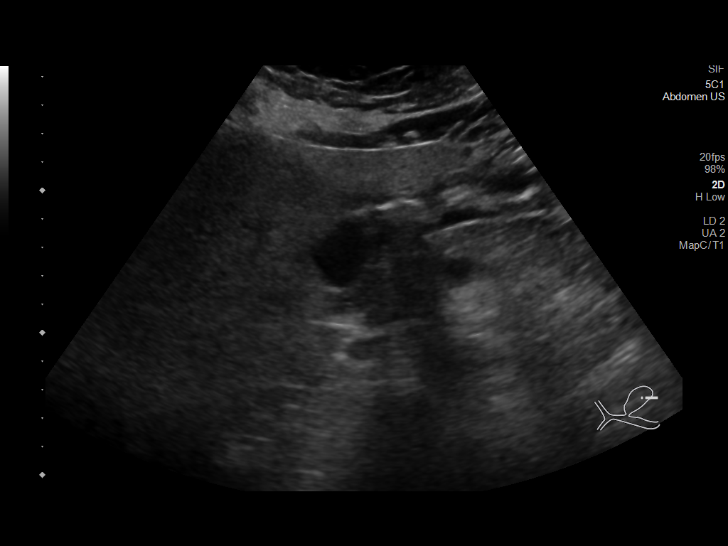
[im 16/97]
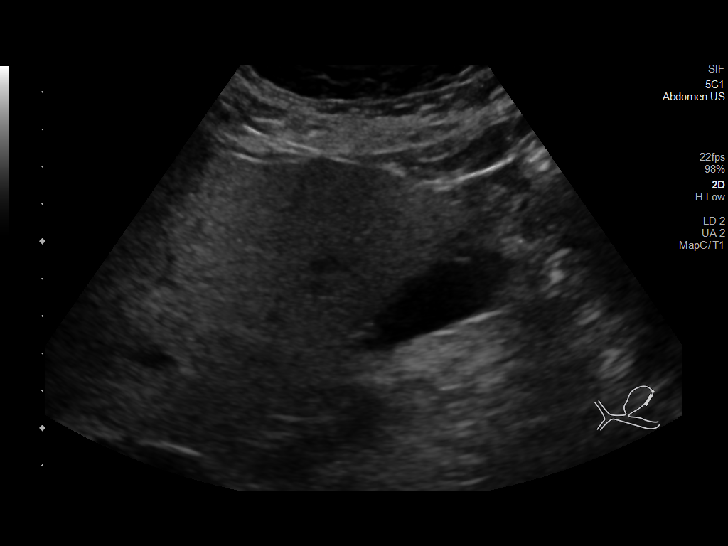
[im 26/97]
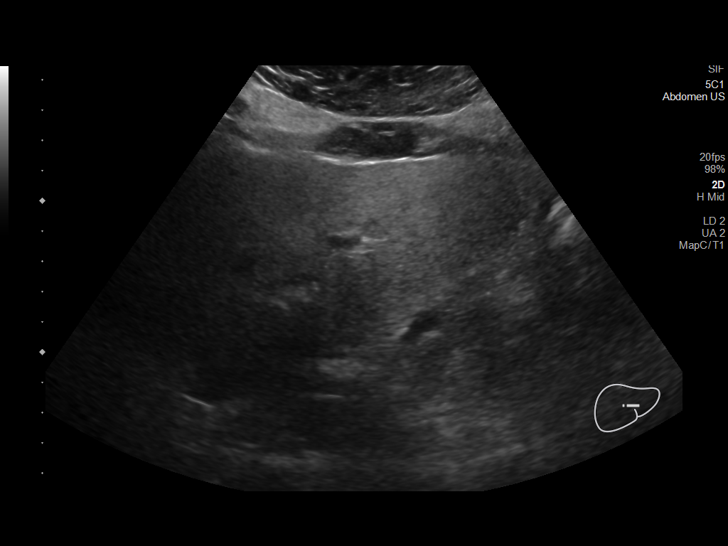
[im 36/97]
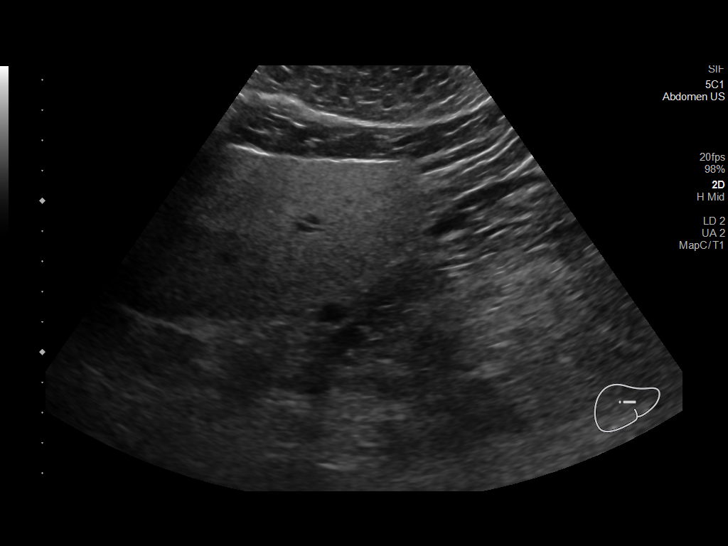
[im 46/97]
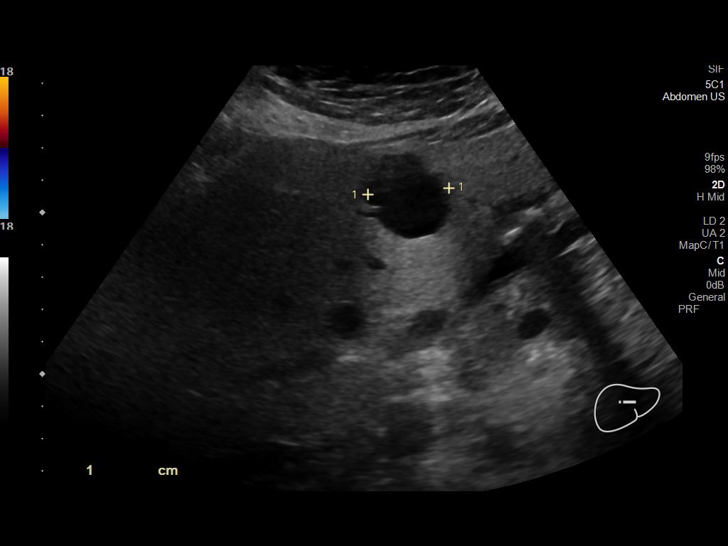
[im 56/97]
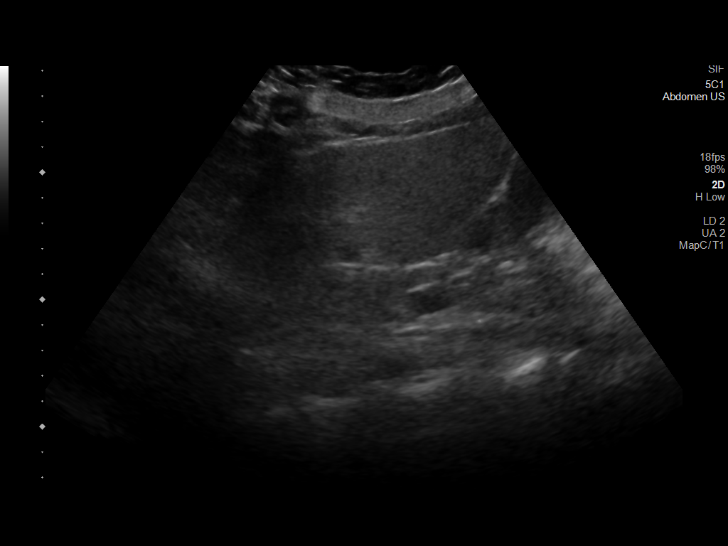
[im 66/97]
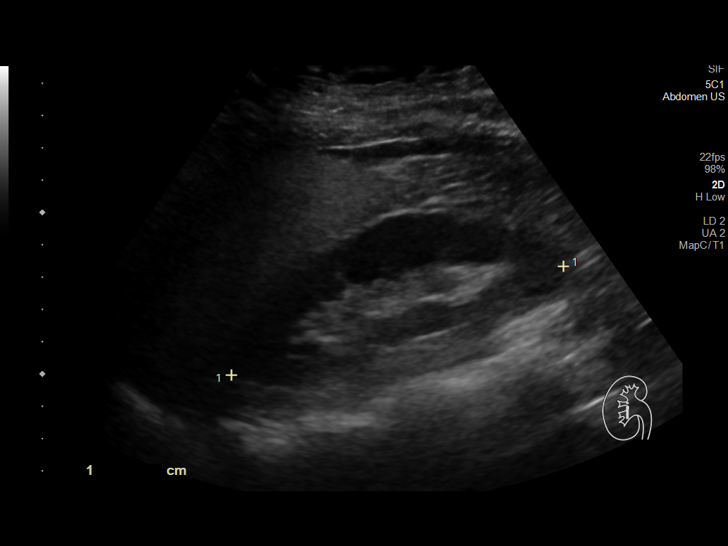
[im 76/97]
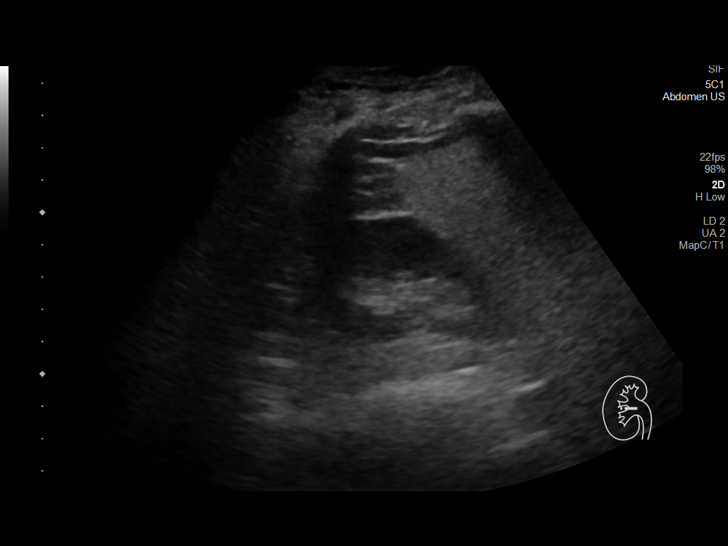
[im 86/97]
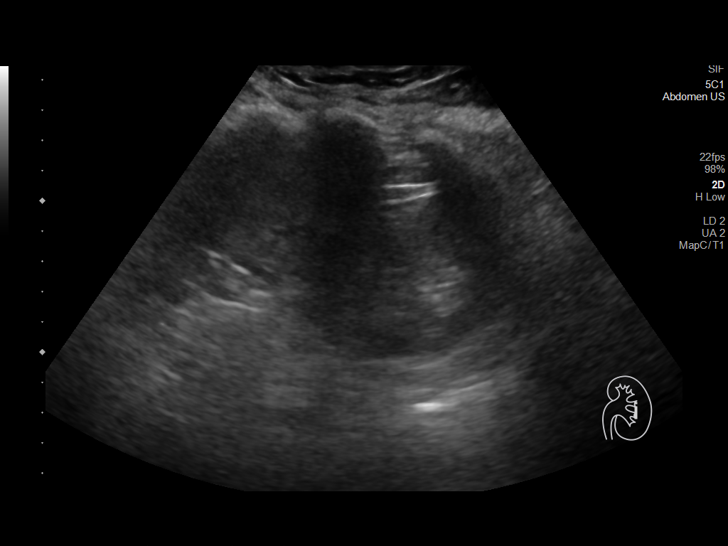
[im 97/97]
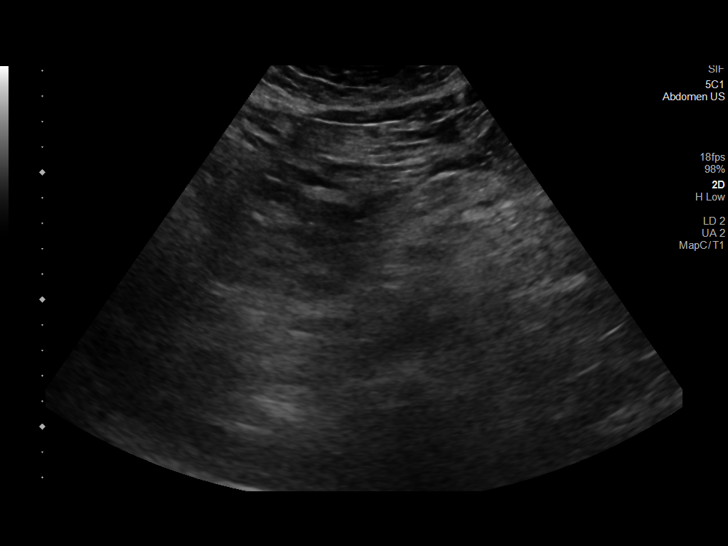

[Series 1001: abdomen us · 2 of 25 slices shown]
[im 7/25]
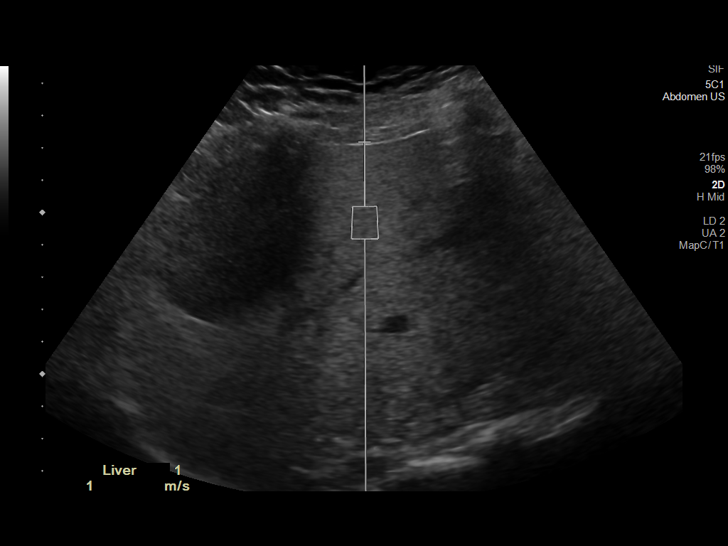
[im 19/25]
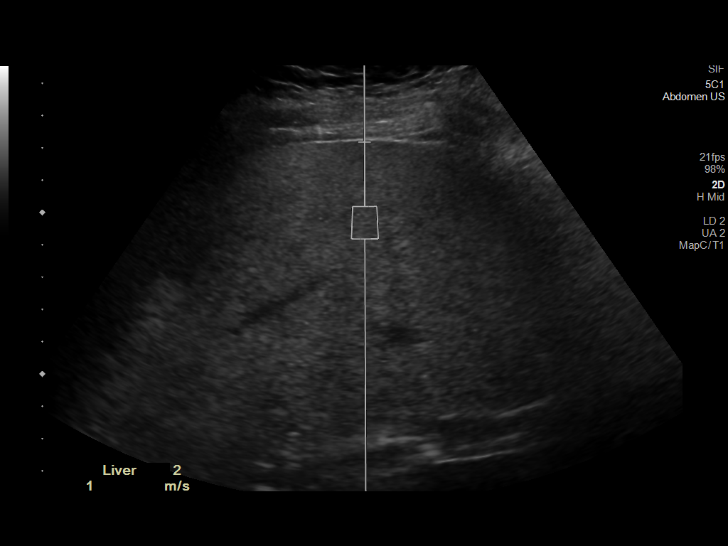

[12 of 25 positions shown; findings below may reference images not displayed]

FINDINGS: ULTRASOUND ABDOMEN

Gallbladder: No gallstones or wall thickening visualized. No
sonographic Murphy sign noted by sonographer.

Common bile duct: Diameter: 7 mm

Liver: Simple appearing bilobar hepatic cysts measuring up to
cm. Diffusely increased parenchymal echogenicity. Portal vein is
patent on color Doppler imaging with normal direction of blood flow
towards the liver.

IVC: No abnormality visualized.

Pancreas: Visualized portion unremarkable.

Spleen: Size and appearance within normal limits.

Right Kidney: Length: 10.8 cm. Echogenicity within normal limits. No
mass or hydronephrosis visualized.

Left Kidney: Length: 10.3 cm. Echogenicity within normal limits. No
mass or hydronephrosis visualized.

Abdominal aorta: No aneurysm visualized.

Other findings: None.

ULTRASOUND HEPATIC ELASTOGRAPHY

Device: Siemens Helix VTQ

Patient position: Oblique

Transducer 5C1

Number of measurements: 10

Hepatic segment:  8

Median kPa:

IQR:

IQR/Median kPa ratio:

Data quality:  Good

Diagnostic category:  < or = 5 kPa: high probability of being normal

The use of hepatic elastography is applicable to patients with viral
hepatitis and non-alcoholic fatty liver disease. At this time, there
is insufficient data for the referenced cut-off values and use in
other causes of liver disease, including alcoholic liver disease.
Patients, however, may be assessed by elastography and serve as
their own reference standard/baseline.

In patients with non-alcoholic liver disease, the values suggesting
compensated advanced chronic liver disease (cACLD) may be lower, and
patients may need additional testing with elasticity results of [DATE]
kPa.

Please note that abnormal hepatic elasticity and shear wave
velocities may also be identified in clinical settings other than
with hepatic fibrosis, such as: acute hepatitis, elevated right
heart and central venous pressures including use of beta blockers,
Jim disease (Bi Lel), infiltrative processes such as
mastocytosis/amyloidosis/infiltrative tumor/lymphoma, extrahepatic
cholestasis, with hyperemia in the post-prandial state, and with
liver transplantation. Correlation with patient history, laboratory
data, and clinical condition recommended.

Diagnostic Categories:

< or =5 kPa: high probability of being normal

< or =9 kPa: in the absence of other known clinical signs, rules [DATE] kPa and ?13 kPa: suggestive of cACLD, but needs further testing

>13 kPa: highly suggestive of cACLD

> or =17 kPa: highly suggestive of cACLD with an increased
probability of clinically significant portal hypertension
IMPRESSION: ULTRASOUND ABDOMEN:

Diffusely increased hepatic parenchymal echogenicity, nonspecific
but most commonly reflecting hepatic steatosis.

ULTRASOUND HEPATIC ELASTOGRAPHY:

Median kPa:

Diagnostic category:  < or = 5 kPa: high probability of being normal

## 2022-10-14 ENCOUNTER — Other Ambulatory Visit: Payer: Self-pay | Admitting: Physician Assistant

## 2022-10-14 DIAGNOSIS — Z0000 Encounter for general adult medical examination without abnormal findings: Secondary | ICD-10-CM

## 2022-10-29 ENCOUNTER — Ambulatory Visit
Admission: RE | Admit: 2022-10-29 | Discharge: 2022-10-29 | Disposition: A | Discharge status: Home / Self Care | Payer: BC Managed Care – PPO | Source: Ambulatory Visit | Attending: Physician Assistant | Admitting: Physician Assistant

## 2022-10-29 DIAGNOSIS — Z0000 Encounter for general adult medical examination without abnormal findings: Secondary | ICD-10-CM

## 2022-11-21 ENCOUNTER — Ambulatory Visit (INDEPENDENT_AMBULATORY_CARE_PROVIDER_SITE_OTHER): Payer: BC Managed Care – PPO | Admitting: Physician Assistant

## 2022-11-21 ENCOUNTER — Encounter: Payer: Self-pay | Admitting: Physician Assistant

## 2022-11-21 VITALS — BP 110/70 | HR 90 | Temp 97.6°F | Ht 62.0 in | Wt 212.2 lb

## 2022-11-21 DIAGNOSIS — F3342 Major depressive disorder, recurrent, in full remission: Secondary | ICD-10-CM

## 2022-11-21 DIAGNOSIS — Z131 Encounter for screening for diabetes mellitus: Secondary | ICD-10-CM

## 2022-11-21 DIAGNOSIS — F411 Generalized anxiety disorder: Secondary | ICD-10-CM | POA: Diagnosis not present

## 2022-11-21 DIAGNOSIS — M170 Bilateral primary osteoarthritis of knee: Secondary | ICD-10-CM | POA: Diagnosis not present

## 2022-11-21 DIAGNOSIS — Z23 Encounter for immunization: Secondary | ICD-10-CM | POA: Diagnosis not present

## 2022-11-21 DIAGNOSIS — Z13220 Encounter for screening for lipoid disorders: Secondary | ICD-10-CM | POA: Diagnosis not present

## 2022-11-21 DIAGNOSIS — Z0000 Encounter for general adult medical examination without abnormal findings: Secondary | ICD-10-CM

## 2022-11-21 DIAGNOSIS — Z1211 Encounter for screening for malignant neoplasm of colon: Secondary | ICD-10-CM

## 2022-11-21 LAB — COMPREHENSIVE METABOLIC PANEL
ALT: 42 U/L — ABNORMAL HIGH (ref 0–35)
AST: 33 U/L (ref 0–37)
Albumin: 4.5 g/dL (ref 3.5–5.2)
Alkaline Phosphatase: 88 U/L (ref 39–117)
BUN: 18 mg/dL (ref 6–23)
CO2: 27 mEq/L (ref 19–32)
Calcium: 9.7 mg/dL (ref 8.4–10.5)
Chloride: 103 mEq/L (ref 96–112)
Creatinine, Ser: 0.75 mg/dL (ref 0.40–1.20)
GFR: 86.19 mL/min (ref >60.00)
Glucose, Bld: 121 mg/dL — ABNORMAL HIGH (ref 70–99)
Potassium: 4.1 mEq/L (ref 3.5–5.1)
Sodium: 139 mEq/L (ref 135–145)
Total Bilirubin: 0.5 mg/dL (ref 0.2–1.2)
Total Protein: 7.4 g/dL (ref 6.0–8.3)

## 2022-11-21 LAB — LIPID PANEL
Cholesterol: 202 mg/dL — ABNORMAL HIGH (ref 0–200)
HDL: 61.5 mg/dL (ref >39.00)
LDL Cholesterol: 104 mg/dL — ABNORMAL HIGH (ref 0–99)
NonHDL: 140.01
Total CHOL/HDL Ratio: 3
Triglycerides: 180 mg/dL — ABNORMAL HIGH (ref 0.0–149.0)
VLDL: 36 mg/dL (ref 0.0–40.0)

## 2022-11-21 LAB — CBC WITH DIFFERENTIAL/PLATELET
Basophils Absolute: 0 10*3/uL (ref 0.0–0.1)
Basophils Relative: 0.8 % (ref 0.0–3.0)
Eosinophils Absolute: 0.1 10*3/uL (ref 0.0–0.7)
Eosinophils Relative: 2.1 % (ref 0.0–5.0)
HCT: 40 % (ref 36.0–46.0)
Hemoglobin: 13 g/dL (ref 12.0–15.0)
Lymphocytes Relative: 36.8 % (ref 12.0–46.0)
Lymphs Abs: 2.1 10*3/uL (ref 0.7–4.0)
MCHC: 32.4 g/dL (ref 30.0–36.0)
MCV: 91.4 fl (ref 78.0–100.0)
Monocytes Absolute: 0.4 10*3/uL (ref 0.1–1.0)
Monocytes Relative: 6.2 % (ref 3.0–12.0)
Neutro Abs: 3.1 10*3/uL (ref 1.4–7.7)
Neutrophils Relative %: 54.1 % (ref 43.0–77.0)
Platelets: 280 10*3/uL (ref 150.0–400.0)
RBC: 4.38 Mil/uL (ref 3.87–5.11)
RDW: 14.9 % (ref 11.5–15.5)
WBC: 5.8 10*3/uL (ref 4.0–10.5)

## 2022-11-21 LAB — TSH: TSH: 1.91 u[IU]/mL (ref 0.35–5.50)

## 2022-11-21 LAB — HEMOGLOBIN A1C: Hgb A1c MFr Bld: 5.7 % (ref 4.6–6.5)

## 2022-11-21 MED ORDER — MELOXICAM 15 MG PO TABS: 15.0000 mg | ORAL_TABLET | Freq: Every day | ORAL | 2 refills | Status: DC

## 2022-11-21 MED ORDER — SERTRALINE HCL 100 MG PO TABS: 100.0000 mg | ORAL_TABLET | Freq: Every day | ORAL | 3 refills | Status: DC

## 2022-11-21 MED ORDER — GABAPENTIN 100 MG PO CAPS: 200.0000 mg | ORAL_CAPSULE | Freq: Every day | ORAL | 3 refills | Status: DC

## 2022-11-21 NOTE — Patient Instructions (Addendum)
Very good to see you today!   Labs today - will keep you updated on results.  Tdap and Shingrix updated  Start on Meloxicam daily to help with osteoarthritis. Keep up good work in the pool. Keep working on good lifestyle habits!  Call if any concerns.

## 2022-11-21 NOTE — Progress Notes (Unsigned)
Subjective:    Patient ID: Sandra Mills, female    DOB: 1960/05/14, 62 y.o.   MRN: [id number]  Chief Complaint  Patient presents with   Anemia    Pt here for annual exam and is not currently fasting     HPI Patient is in today for annual exam.  Acute concerns: Bilateral knee pain - went to [facility] - told OA in knees & had oral steroids for a short time, which did help. Pool has helped some. Stairs / change in positions hurts.   Health maintenance: Lifestyle/ exercise: minimal at this time Nutrition: working on balance Mental health: doing well, called off engagement and report this was a good choice Substance use: none Sexual activity: not currently  Immunizations: tdap and shingrix to be updated Colonoscopy: Due next month  Pap: UTD with GYN  Mammogram: UTD DEXA: start at age 38     Past Medical History:  Diagnosis Date   Anxiety    Arthritis    Colon polyps 12/09/2012   three polyps; [facility]; repeat in 3-5 years.   Depression    IBS (irritable bowel syndrome)    diarrhea predominant; s/p GI consult with colonoscopy   Migraine    menstrually related.   Obesity    Tremor, essential     Past Surgical History:  Procedure Laterality Date   APPENDECTOMY     COLONOSCOPY W/ POLYPECTOMY  12/09/2012   three polyps; [facility]. Repeat in 3-5 years.   Cyst resection perineal     LAPAROSCOPIC APPENDECTOMY N/A 02/16/2022   Procedure: APPENDECTOMY LAPAROSCOPIC;  Surgeon: Griselda Miner, MD;  Location: [facility];  Service: General;  Laterality: N/A;    Family History  Problem Relation Age of Onset   Breast cancer Mother 4       spread   Anxiety disorder Mother    Depression Mother    Atrial fibrillation Father    Atrial fibrillation Sister    ADD / ADHD Daughter    Breast cancer Other        maternal great aunt dx in her 108s   Breast cancer Other        6 maternal great aunts dx in their 32s   Colon cancer Neg Hx    Esophageal cancer Neg Hx     Social History    Tobacco Use   Smoking status: Never   Smokeless tobacco: Never  Vaping Use   Vaping status: Never Used  Substance Use Topics   Alcohol use: Not Currently    Comment: rarely   Drug use: No     No Known Allergies  Review of Systems NEGATIVE UNLESS OTHERWISE INDICATED IN HPI      Objective:     BP 110/70   Pulse 90   Temp 97.6 F (36.4 C)   Ht 5\' 2"  (1.575 m)   Wt 212 lb 3.2 oz (96.3 kg)   SpO2 98%   BMI 38.81 kg/m   Wt Readings from Last 3 Encounters:  11/21/22 212 lb 3.2 oz (96.3 kg)  03/03/22 198 lb (89.8 kg)  02/16/22 185 lb (83.9 kg)    BP Readings from Last 3 Encounters:  11/21/22 110/70  03/03/22 125/86  02/17/22 107/65     Physical Exam Vitals and nursing note reviewed.  Constitutional:      Appearance: Normal appearance. She is obese. She is not toxic-appearing.  HENT:     Head: Normocephalic and atraumatic.     Right Ear: Tympanic membrane,  ear canal and external ear normal.     Left Ear: Tympanic membrane, ear canal and external ear normal.     Nose: Nose normal.     Mouth/Throat:     Mouth: Mucous membranes are moist.  Eyes:     Extraocular Movements: Extraocular movements intact.     Conjunctiva/sclera: Conjunctivae normal.     Pupils: Pupils are equal, round, and reactive to light.  Cardiovascular:     Rate and Rhythm: Normal rate and regular rhythm.     Pulses: Normal pulses.     Heart sounds: Normal heart sounds.  Pulmonary:     Effort: Pulmonary effort is normal.     Breath sounds: Normal breath sounds.  Abdominal:     General: Abdomen is flat. Bowel sounds are normal.     Palpations: Abdomen is soft.  Musculoskeletal:        General: Normal range of motion.     Cervical back: Normal range of motion and neck supple.  Skin:    General: Skin is warm and dry.     Findings: No lesion or rash.  Neurological:     General: No focal deficit present.     Mental Status: She is alert and oriented to person, place, and time.   Psychiatric:        Mood and Affect: Mood normal.        Behavior: Behavior normal.        Thought Content: Thought content normal.        Judgment: Judgment normal.        Assessment & Plan:  Encounter for annual physical exam -     Lipid panel -     Comprehensive metabolic panel -     CBC with Differential/Platelet -     Hemoglobin A1c -     TSH  Need for shingles vaccine -     Varicella-zoster vaccine IM  Need for Tdap vaccination -     Tdap vaccine greater than or equal to 7yo IM  Recurrent major depressive disorder, in full remission (HCC) -     Gabapentin; Take 2 capsules (200 mg total) by mouth at bedtime.  Dispense: 180 capsule; Refill: 3 -     Sertraline HCl; Take 1 tablet (100 mg total) by mouth daily.  Dispense: 90 tablet; Refill: 3  Generalized anxiety disorder -     Sertraline HCl; Take 1 tablet (100 mg total) by mouth daily.  Dispense: 90 tablet; Refill: 3  Screening for colon cancer -     Ambulatory referral to Gastroenterology  Bilateral primary osteoarthritis of knee -     Meloxicam; Take 1 tablet (15 mg total) by mouth daily. Take with food.  Dispense: 30 tablet; Refill: 2    Age-appropriate screening and counseling performed today. Will check labs and call with results. Preventive measures discussed and printed in AVS for patient.   Patient Counseling: [x]   Nutrition: Stressed importance of moderation in sodium/caffeine intake, saturated fat and cholesterol, caloric balance, sufficient intake of fresh fruits, vegetables, and fiber.  [x]   Stressed the importance of regular exercise.   []   Substance Abuse: Discussed cessation/primary prevention of tobacco, alcohol, or other drug use; driving or other dangerous activities under the influence; availability of treatment for abuse.   []   Injury prevention: Discussed safety belts, safety helmets, smoke detector, smoking near bedding or upholstery.   []   Sexuality: Discussed sexually transmitted diseases,  partner selection, use of condoms, avoidance of unintended pregnancy  and contraceptive alternatives.   [x]   Dental health: Discussed importance of regular tooth brushing, flossing, and dental visits.  [x]   Health maintenance and immunizations reviewed. Please refer to Health maintenance section.        Return in about 6 months (around 05/24/2023) for recheck/follow-up.     Konnor Vondrasek M Jalayah Gutridge, PA-C

## 2022-11-23 NOTE — Assessment & Plan Note (Signed)
Stable with gabapentin and sertraline, refilled today

## 2022-11-23 NOTE — Assessment & Plan Note (Signed)
Plan to start on Meloxicam 15 mg daily. Pt aware of risks vs benefits and possible adverse reactions. Needs to continue to work on weight loss. Consider sports med / ortho referral.

## 2022-12-26 ENCOUNTER — Telehealth (INDEPENDENT_AMBULATORY_CARE_PROVIDER_SITE_OTHER): Payer: BC Managed Care – PPO | Admitting: Family

## 2022-12-26 DIAGNOSIS — U071 COVID-19: Secondary | ICD-10-CM

## 2022-12-26 MED ORDER — PAXLOVID (300/100) 20 X 150 MG & 10 X 100MG PO TBPK: ORAL_TABLET | ORAL | 0 refills | Status: DC

## 2022-12-26 MED ORDER — ONDANSETRON 4 MG PO TBDP: 4.0000 mg | ORAL_TABLET | Freq: Three times a day (TID) | ORAL | 0 refills | Status: DC | PRN

## 2022-12-26 NOTE — Assessment & Plan Note (Signed)
  Positive test with symptoms of body aches, rhinorrhea, and vomiting. Vaccinated but first infection. Discussed the risks/benefits of Paxlovid (antiviral medication) and decided to start treatment due to obesity as a risk factor. -Start Paxlovid, monitor for metallic taste as a common side effect. -Continue symptomatic management with Tylenol or Motrin for body aches and fever. -Send in prescription for anti-nausea medication (dissolvable tablet). -discussed importance of rest and hydration -discussed need to go to the ER if unable to keep down food/liquids, increased weakness or shortness of breath.  Return to work Discussed CDC guidelines for return to work after COVID-19 infection. -Can return to work on day 6 (Wednesday) if symptoms are improving and no fever, with continued masking for an additional 5 days.  General Health Maintenance -Plan to get flu shot once recovered from COVID-19. -Consider COVID-19 booster in three months.

## 2022-12-26 NOTE — Patient Instructions (Signed)
VISIT SUMMARY:  During your visit, we discussed your recent positive COVID-19 test and associated symptoms, which include body aches, a runny nose, and vomiting. We also talked about your return to work and general health maintenance.  YOUR PLAN:  -COVID-19: COVID-19 is a viral infection that can cause a range of symptoms. Given your positive test and symptoms, we've decided to start you on Paxlovid, an antiviral medication. This medication can help reduce the severity of your symptoms. You may experience a metallic taste, which is a common side effect. Continue to manage your symptoms with Tylenol or Motrin for body aches and fever. I've also sent in a prescription for an anti-nausea medication to help with your vomiting.  Stay well hydrated and get plenty of rest.   -RETURN TO WORK: According to CDC guidelines, you can return to work on day 6 (Wednesday) if your symptoms are improving and you have no fever. However, you should continue to wear a mask for an additional 5 days after returning to work.  -GENERAL HEALTH MAINTENANCE: Once you've recovered from COVID-19, it's important to get your flu shot. Also, consider getting a COVID-19 booster shot in three months to further protect yourself from the virus.  INSTRUCTIONS:  Please start taking Paxlovid as prescribed and monitor for any side effects, particularly a metallic taste. Continue taking Tylenol or Motrin for body aches and fever. Use the anti-nausea medication as needed. You can return to work on Wednesday if your symptoms are improving and you have no fever, but remember to continue wearing a mask for an additional 5 days. Once you've recovered, please get your flu shot and consider a COVID-19 booster shot in three months.

## 2022-12-26 NOTE — Progress Notes (Signed)
MyChart Video Visit    Virtual Visit via Video Note    Patient location: Home. Patient and provider in visit Provider location: Office  I discussed the limitations of evaluation and management by telemedicine and the availability of in person appointments. The patient expressed understanding and agreed to proceed.  Visit Date: 12/26/2022  Today's healthcare provider: Lemont Fillers, NP     Subjective:    Patient ID: Sandra Mills, female    DOB: 06-28-1960, 62 y.o.   MRN: [id number]  Chief Complaint  Patient presents with   Covid Positive    Covid Positive    HPI  The patient, a school teacher with obesity, presents with symptoms of COVID-19 that started yesterday. She initially experienced sneezing yesterday afternoon, which was unusual for her as she regularly takes Zyrtec. Later in the evening, she developed a runny nose and generalized body aches, including in the arms, legs, back, and head. She had difficulty sleeping due to the discomfort and the need to frequently blow her nose. This morning, she experienced repeated episodes of vomiting. She tested positive for COVID-19 today. She has received some of the COVID vaccines. She has never had COVID-19 virus infection before.  Past Medical History:  Diagnosis Date   Anxiety    Arthritis    Colon polyps 12/09/2012   three polyps; [facility] Howard; repeat in 3-5 years.   Depression    IBS (irritable bowel syndrome)    diarrhea predominant; s/p GI consult with colonoscopy   Migraine    menstrually related.   Obesity    Tremor, essential     Past Surgical History:  Procedure Laterality Date   APPENDECTOMY     COLONOSCOPY W/ POLYPECTOMY  12/09/2012   three polyps; [facility]. Repeat in 3-5 years.   Cyst resection perineal     LAPAROSCOPIC APPENDECTOMY N/A 02/16/2022   Procedure: APPENDECTOMY LAPAROSCOPIC;  Surgeon: Griselda Miner, MD;  Location: [facility];  Service: General;  Laterality: N/A;    Family History   Problem Relation Age of Onset   Breast cancer Mother 60       spread   Anxiety disorder Mother    Depression Mother    Atrial fibrillation Father    Atrial fibrillation Sister    ADD / ADHD Daughter    Breast cancer Other        maternal great aunt dx in her 68s   Breast cancer Other        6 maternal great aunts dx in their 81s   Colon cancer Neg Hx    Esophageal cancer Neg Hx     Social History   Socioeconomic History   Marital status: Significant Other    Spouse name: Chip   Number of children: 4   Years of education: Master's   Highest education level: Not on file  Occupational History   Occupation: Magazine features editor: [employer]    Comment: Music (choir/voice)  Tobacco Use   Smoking status: Never   Smokeless tobacco: Never  Vaping Use   Vaping status: Never Used  Substance and Sexual Activity   Alcohol use: Not Currently    Comment: rarely   Drug use: No   Sexual activity: Not Currently    Birth control/protection: None    Comment: Husband vasectomy  Other Topics Concern   Not on file  Social History Narrative   Marital status:  Married x 31 years, no abuse. Separated 11/2015. Dating.  Lives: Lives alone, children (1993, 1995, 1997, 1999) live with their father and visit her regulalry.  No grandchildren.      Children: 4 children; no grandchildren.      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  Previously worked [employer].   No tobacco/alcohol/drugs; wine three times per year.   Exercise sporadic; curves and [employer].   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     Social Determinants of Health   Financial Resource Strain: Low Risk  (12/06/2018)   Overall Financial Resource Strain (CARDIA)    Difficulty of Paying Living Expenses: Not hard at all  Food Insecurity: No Food Insecurity (02/16/2022)   Hunger Vital Sign    Worried About Running Out of Food in the Last Year: Never true    Ran Out of Food in the Last Year: Never true   Transportation Needs: No Transportation Needs (02/16/2022)   PRAPARE - Administrator, Civil Service (Medical): No    Lack of Transportation (Non-Medical): No  Physical Activity: Insufficiently Active (12/06/2018)   Exercise Vital Sign    Days of Exercise per Week: 3 days    Minutes of Exercise per Session: 30 min  Stress: Stress Concern Present (12/06/2018)   [employer] of Occupational Health - Occupational Stress Questionnaire    Feeling of Stress : To some extent  Social Connections: Unknown (12/06/2018)   Social Connection and Isolation Panel [NHANES]    Frequency of Communication with Friends and Family: Three times a week    Frequency of Social Gatherings with Friends and Family: Twice a week    Attends Religious Services: Patient declined    Database administrator or Organizations: Patient declined    Attends Banker Meetings: Patient declined    Marital Status: Patient declined  Intimate Partner Violence: Not At Risk (02/16/2022)   Humiliation, Afraid, Rape, and Kick questionnaire    Fear of Current or Ex-Partner: No    Emotionally Abused: No    Physically Abused: No    Sexually Abused: No    Outpatient Medications Prior to Visit  Medication Sig Dispense Refill   cetirizine (ZYRTEC) 10 MG tablet Take 1 tablet (10 mg total) by mouth daily. 30 tablet 11   gabapentin (NEURONTIN) 100 MG capsule Take 2 capsules (200 mg total) by mouth at bedtime. 180 capsule 3   meloxicam (MOBIC) 15 MG tablet Take 1 tablet (15 mg total) by mouth daily. Take with food. 30 tablet 2   sertraline (ZOLOFT) 100 MG tablet Take 1 tablet (100 mg total) by mouth daily. 90 tablet 3   No facility-administered medications prior to visit.    No Known Allergies  ROS See HPI    Objective:    Physical Exam  There were no vitals taken for this visit. Wt Readings from Last 3 Encounters:  11/21/22 212 lb 3.2 oz (96.3 kg)  03/03/22 198 lb (89.8 kg)  02/16/22 185 lb (83.9  kg)   Gen: Awake, alert, laying in bed. Appears tired but in no acute distress ENT: sniffling frequenty Resp: Breathing is even and non-labored Psych: calm/pleasant demeanor Neuro: Alert and Oriented x 3, + facial symmetry, speech is clear.      Assessment & Plan:   Problem List Items Addressed This Visit       Unprioritized   COVID-19 virus infection - Primary     Positive test with symptoms of body aches, rhinorrhea, and vomiting. Vaccinated but first infection. Discussed the  risks/benefits of Paxlovid (antiviral medication) and decided to start treatment due to obesity as a risk factor. -Start Paxlovid, monitor for metallic taste as a common side effect. -Continue symptomatic management with Tylenol or Motrin for body aches and fever. -Send in prescription for anti-nausea medication (dissolvable tablet). -discussed importance of rest and hydration -discussed need to go to the ER if unable to keep down food/liquids, increased weakness or shortness of breath.  Return to work Discussed CDC guidelines for return to work after COVID-19 infection. -Can return to work on day 6 (Wednesday) if symptoms are improving and no fever, with continued masking for an additional 5 days.  General Health Maintenance -Plan to get flu shot once recovered from COVID-19. -Consider COVID-19 booster in three months.      Relevant Medications   nirmatrelvir & ritonavir (PAXLOVID, 300/100,) 20 x 150 MG & 10 x 100MG  TBPK    I am having Sandra Everman. Mills start on Paxlovid (300/100) and ondansetron. I am also having her maintain her cetirizine, meloxicam, gabapentin, and sertraline.  Meds ordered this encounter  Medications   nirmatrelvir & ritonavir (PAXLOVID, 300/100,) 20 x 150 MG & 10 x 100MG  TBPK    Sig: Please take per package instructions    Dispense:  30 tablet    Refill:  0    Order Specific Question:   Supervising Provider    Answer:   Danise Edge A [[id number]]   ondansetron (ZOFRAN-ODT)  4 MG disintegrating tablet    Sig: Take 1 tablet (4 mg total) by mouth every 8 (eight) hours as needed for nausea or vomiting.    Dispense:  20 tablet    Refill:  0    Order Specific Question:   Supervising Provider    Answer:   Danise Edge A [[id number]]    I discussed the assessment and treatment plan with the patient. The patient was provided an opportunity to ask questions and all were answered. The patient agreed with the plan and demonstrated an understanding of the instructions.   The patient was advised to call back or seek an in-person evaluation if the symptoms worsen or if the condition fails to improve as anticipated.   Lemont Fillers, NP [facility] [phone] (phone) [phone] (fax)  [facility]

## 2023-04-19 ENCOUNTER — Other Ambulatory Visit: Payer: Self-pay | Admitting: Physician Assistant

## 2023-04-19 DIAGNOSIS — M170 Bilateral primary osteoarthritis of knee: Secondary | ICD-10-CM

## 2023-06-01 ENCOUNTER — Encounter: Payer: Self-pay | Admitting: Family Medicine

## 2023-06-01 ENCOUNTER — Ambulatory Visit: Payer: 59 | Admitting: Family Medicine

## 2023-06-01 VITALS — BP 111/75 | HR 99 | Temp 97.7°F | Ht 62.0 in | Wt 206.0 lb

## 2023-06-01 DIAGNOSIS — R059 Cough, unspecified: Secondary | ICD-10-CM | POA: Diagnosis not present

## 2023-06-01 DIAGNOSIS — J40 Bronchitis, not specified as acute or chronic: Secondary | ICD-10-CM

## 2023-06-01 LAB — POCT INFLUENZA A/B
Influenza A, POC: NEGATIVE
Influenza B, POC: NEGATIVE

## 2023-06-01 MED ORDER — AMOXICILLIN-POT CLAVULANATE 875-125 MG PO TABS: 1.0000 | ORAL_TABLET | Freq: Two times a day (BID) | ORAL | 0 refills | Status: DC

## 2023-06-01 MED ORDER — ALBUTEROL SULFATE HFA 108 (90 BASE) MCG/ACT IN AERS: 2.0000 | INHALATION_SPRAY | Freq: Four times a day (QID) | RESPIRATORY_TRACT | 0 refills | Status: DC | PRN

## 2023-06-01 MED ORDER — BENZONATATE 100 MG PO CAPS: 100.0000 mg | ORAL_CAPSULE | Freq: Three times a day (TID) | ORAL | 0 refills | Status: DC | PRN

## 2023-06-01 MED ORDER — AZITHROMYCIN 250 MG PO TABS: ORAL_TABLET | ORAL | 0 refills | Status: AC

## 2023-06-01 MED ORDER — PREDNISONE 20 MG PO TABS: 40.0000 mg | ORAL_TABLET | Freq: Every day | ORAL | 0 refills | Status: AC

## 2023-06-01 NOTE — Patient Instructions (Signed)
 Meds sent

## 2023-06-01 NOTE — Progress Notes (Signed)
Subjective:     Patient ID: Sandra Mills, female    DOB: 1961-04-03, 63 y.o.   MRN: [id number]  Chief Complaint  Patient presents with   Cough    Took two Covid test, both negative    Sore Throat    Pt c/o of scratchy throat only when coughing    Headache    Pt c.o of headache for 1 week, pt has tried NyQuil and Tylenol that has helped some    HPI Discussed the use of AI scribe software for clinical note transcription with the patient, who gave verbal consent to proceed.  History of Present Illness   The patient presents with a persistent cough and fever.  She has been experiencing a persistent cough and fever for about three weeks. Initially, her symptoms were severe but improved slightly before worsening again. Recently, she has had a recurrence of fever, reaching around 101F in the evenings for the last three nights, accompanied by headache and body aches.  Her cough is exacerbated by talking and has led to episodes of gagging and vomiting, particularly when walking her dog two nights ago. She has significant shortness of breath but denies any history of asthma. She had pneumonia once in the past and recalls using inhalers during that time. She describes her current wheezing as less severe than during her previous pneumonia episode.  She has vomited once and attributes it to the exacerbation of her cough. The cough is particularly troublesome when she is active, such as when walking her dog.  She has been taking ibuprofen to manage her symptoms, which helps keep her fever down during the day, but it rises again at night. She has previously used ondansetron for nausea during a COVID-19 infection in August but is not currently using it. Her current medications include sertraline, gabapentin, and meloxicam.       There are no preventive care reminders to display for this patient.  Past Medical History:  Diagnosis Date   Anxiety    Arthritis    Colon polyps 12/09/2012    three polyps; [facility]; repeat in 3-5 years.   Depression    IBS (irritable bowel syndrome)    diarrhea predominant; s/p GI consult with colonoscopy   Migraine    menstrually related.   Obesity    Tremor, essential     Past Surgical History:  Procedure Laterality Date   APPENDECTOMY     COLONOSCOPY W/ POLYPECTOMY  12/09/2012   three polyps; [facility]. Repeat in 3-5 years.   Cyst resection perineal     LAPAROSCOPIC APPENDECTOMY N/A 02/16/2022   Procedure: APPENDECTOMY LAPAROSCOPIC;  Surgeon: Chevis Pretty III, MD;  Location: [facility];  Service: General;  Laterality: N/A;     Current Outpatient Medications:    albuterol (VENTOLIN HFA) 108 (90 Base) MCG/ACT inhaler, Inhale 2 puffs into the lungs every 6 (six) hours as needed for wheezing or shortness of breath., Disp: 8 g, Rfl: 0   amoxicillin-clavulanate (AUGMENTIN) 875-125 MG tablet, Take 1 tablet by mouth 2 (two) times daily., Disp: 20 tablet, Rfl: 0   azithromycin (ZITHROMAX) 250 MG tablet, Take 2 tablets on day 1, then 1 tablet daily on days 2 through 5, Disp: 6 tablet, Rfl: 0   benzonatate (TESSALON PERLES) 100 MG capsule, Take 1 capsule (100 mg total) by mouth 3 (three) times daily as needed., Disp: 20 capsule, Rfl: 0   cetirizine (ZYRTEC) 10 MG tablet, Take 1 tablet (10 mg total) by mouth daily., Disp: 30  tablet, Rfl: 11   meloxicam (MOBIC) 15 MG tablet, TAKE 1 TABLET BY MOUTH EVERY DAY WITH FOOD, Disp: 30 tablet, Rfl: 2   predniSONE (DELTASONE) 20 MG tablet, Take 2 tablets (40 mg total) by mouth daily with breakfast for 5 days., Disp: 10 tablet, Rfl: 0   sertraline (ZOLOFT) 100 MG tablet, Take 100 mg by mouth daily., Disp: , Rfl:    gabapentin (NEURONTIN) 100 MG capsule, Take 2 capsules (200 mg total) by mouth at bedtime., Disp: 180 capsule, Rfl: 3  No Known Allergies ROS neg/noncontributory except as noted HPI/below      Objective:     BP 111/75   Pulse 99   Temp 97.7 F (36.5 C)   Ht 5\' 2"  (1.575 m)   Wt 206 lb (93.4 kg)    SpO2 96%   BMI 37.68 kg/m  Wt Readings from Last 3 Encounters:  06/01/23 206 lb (93.4 kg)  11/21/22 212 lb 3.2 oz (96.3 kg)  03/03/22 198 lb (89.8 kg)    Physical Exam   Gen: WDWN NAD HEENT: NCAT, conjunctiva not injected, sclera nonicteric TM WNL B, OP moist, no exudates  NECK:  supple, no thyromegaly, no nodes, no carotid bruits CARDIAC: RRR, S1S2+, no murmur. DP 2+B LUNGS: CTAB. No wheezes EXT:  no edema MSK: no gross abnormalities.  NEURO: A&O x3.  CN II-XII intact.  PSYCH: normal mood. Good eye contact  Results for orders placed or performed in visit on 06/01/23  POCT Influenza A/B   Collection Time: 06/01/23 11:04 AM  Result Value Ref Range   Influenza A, POC Negative Negative   Influenza B, POC Negative Negative       Assessment & Plan:  Bronchitis  Cough, unspecified type -     POCT Influenza A/B  Other orders -     Albuterol Sulfate HFA; Inhale 2 puffs into the lungs every 6 (six) hours as needed for wheezing or shortness of breath.  Dispense: 8 g; Refill: 0 -     Benzonatate; Take 1 capsule (100 mg total) by mouth 3 (three) times daily as needed.  Dispense: 20 capsule; Refill: 0 -     predniSONE; Take 2 tablets (40 mg total) by mouth daily with breakfast for 5 days.  Dispense: 10 tablet; Refill: 0 -     Amoxicillin-Pot Clavulanate; Take 1 tablet by mouth 2 (two) times daily.  Dispense: 20 tablet; Refill: 0 -     Azithromycin; Take 2 tablets on day 1, then 1 tablet daily on days 2 through 5  Dispense: 6 tablet; Refill: 0  Assessment and Plan    Prolonged Respiratory Illness with Fever Experiencing a three-week respiratory illness with persistent cough, intermittent fever up to 101F, headache, body aches, and shortness of breath. COVID-19 and flu tests are negative. Symptoms initially improved but have worsened, with evening fevers over the past three nights. Differential diagnosis includes pneumonia and bronchitis. Due to the severity and duration of symptoms,  antibiotics are prescribed despite the absence of significant wheezing or definitive signs of pneumonia on examination. An inhaler is prescribed for cough and breathing, and prednisone to reduce inflammation. Tessalon Perles is offered for cough management. A note is provided to stay out of work for 48 hours, with the option to extend if symptoms do not improve. Monitor symptoms and return if significantly worse or not improving. discussed cxr but pt declined for now  General Health Maintenance Currently taking sertraline, gabapentin, and meloxicam. Previously took Paxlovid and ondansetron for  COVID-19 in August. No allergies noted. Update medication list to remove Paxlovid and ondansetron.  Follow-up Contact the clinic if symptoms significantly worsen or do not improve. Schedule a follow-up appointment if needed by Thursday, June 04, 2023.        Return if symptoms worsen or fail to improve.  Angelena Sole, MD

## 2023-06-04 ENCOUNTER — Encounter: Payer: Self-pay | Admitting: Family Medicine

## 2023-06-04 ENCOUNTER — Telehealth: Payer: Self-pay

## 2023-06-04 NOTE — Telephone Encounter (Signed)
 Copied from CRM [id number]. Topic: Clinical - Medical Advice >> Jun 04, 2023  9:43 AM Isabell A wrote: Reason for CRM: Patient was told to call back if her symptoms are not getting better - seen Dr.Kulik on Monday 2/3. Patient still experiencing cough, when laying down she can hear it in her lungs.  Please see pt concerns; not feeling better since visit Monday; please advise next steps for patient

## 2023-06-04 NOTE — Telephone Encounter (Signed)
 Called pt to advise PCP recommendations, pt verbalized understanding

## 2023-06-04 NOTE — Telephone Encounter (Signed)
 Per chart note: Lauri Poot, CMA    06/04/23  1:51 PM Note Called pt to advise PCP recommendations, pt verbalized understanding

## 2023-06-23 ENCOUNTER — Other Ambulatory Visit: Payer: Self-pay | Admitting: Family Medicine

## 2023-07-25 ENCOUNTER — Other Ambulatory Visit: Payer: Self-pay | Admitting: Physician Assistant

## 2023-07-25 DIAGNOSIS — M170 Bilateral primary osteoarthritis of knee: Secondary | ICD-10-CM

## 2023-10-07 ENCOUNTER — Encounter: Payer: Self-pay | Admitting: Physician Assistant

## 2023-10-07 ENCOUNTER — Ambulatory Visit: Payer: Self-pay

## 2023-10-07 ENCOUNTER — Ambulatory Visit: Admitting: Physician Assistant

## 2023-10-07 VITALS — BP 120/70 | HR 77 | Temp 97.9°F | Ht 62.0 in | Wt 206.0 lb

## 2023-10-07 DIAGNOSIS — R079 Chest pain, unspecified: Secondary | ICD-10-CM

## 2023-10-07 DIAGNOSIS — R350 Frequency of micturition: Secondary | ICD-10-CM | POA: Diagnosis not present

## 2023-10-07 DIAGNOSIS — R110 Nausea: Secondary | ICD-10-CM

## 2023-10-07 LAB — POCT URINALYSIS DIPSTICK
Bilirubin, UA: NEGATIVE
Blood, UA: NEGATIVE
Glucose, UA: NEGATIVE
Ketones, UA: NEGATIVE
Leukocytes, UA: NEGATIVE
Nitrite, UA: NEGATIVE
Protein, UA: NEGATIVE
Spec Grav, UA: >=1.03 — AB (ref 1.010–1.025)
Urobilinogen, UA: 0.2 U/dL
pH, UA: 6 (ref 5.0–8.0)

## 2023-10-07 MED ORDER — ONDANSETRON HCL 4 MG PO TABS: 4.0000 mg | ORAL_TABLET | Freq: Three times a day (TID) | ORAL | 0 refills | Status: DC | PRN

## 2023-10-07 NOTE — Telephone Encounter (Signed)
 FYI Only or Action Required?: FYI only for provider  Patient was last seen in primary care on 06/01/2023 by Christel Cousins, MD. Called Nurse Triage reporting Urinary Frequency. Symptoms began several days ago. Interventions attempted: Nothing. Symptoms are: unchanged.  Triage Disposition: See Physician Within 24 Hours  Patient/caregiver understands and will follow disposition?: Yes          Copied from CRM [id number]. Topic: Clinical - Red Word Triage >> Oct 07, 2023 10:22 AM [place] A wrote: [facility] that prompted transfer to Nurse Triage: Patient has pain during urination, burning and lower back pain since Friday Reason for Disposition  Side (flank) or lower back pain present  Answer Assessment - Initial Assessment Questions 1. SYMPTOM: What's the main symptom you're concerned about? (e.g., frequency, incontinence)     Frequency, the need to urinate makes her feel nauseated.  2. ONSET: When did the  symptoms start?    Last Friday 3. PAIN: Is there any pain? If Yes, ask: How bad is it? (Scale: 1-10; mild, moderate, severe)     Burning sensation 3/10 4. CAUSE: What do you think is causing the symptoms?     Unknown, had similar symptoms in the past  5. OTHER SYMPTOMS: Do you have any other symptoms? (e.g., blood in urine, fever, flank pain, pain with urination)    Nausea, flank pain, diarrhea, fever not current, last Saturday.  Protocols used: Urinary Symptoms-A-AH

## 2023-10-07 NOTE — Telephone Encounter (Signed)
 Noted

## 2023-10-07 NOTE — Patient Instructions (Signed)
 It was great to see you!  EKG is overall reassuring compared to prior EKG  We will order blood work and ultrasound for further evaluation  If you develop chest pain or new symptom(s), please go to the ER  Take care,  Makaia Rappa PA-C

## 2023-10-07 NOTE — Progress Notes (Signed)
 Sandra Mills is a 63 y.o. female here for a new problem.  History of Present Illness:   Chief Complaint  Patient presents with   Urinary Frequency    Pt c/o urinary frequency, and nausea, low back pain, started on Friday, Saturday night vomited and diarrhea, stated fever Sat night but did not check it.    HPI  Urinary frequency //Nausea // Chest pain Pt complains of urinary frequency starting Friday, 6/6.  Pt reports associated sx of nausea, diarrhea, and lower back pain. She states waking up in the middle of the night with nausea and a stomach ache, which is relieved after urinating. Two nights ago, she also reports breaking out in cold sweats due to the nausea. Pt reports she felt worst 2 nights ago and recently felt left-sided chest pain and a fluttery sensation. She notes a hx of heart burn and previous episodes of UTIs, but states it has but has been a while since the last time.  She also notes she was hospitalized in 2012 due to similar sx and a syncopal episode while on the toilet. Per pt, they did a full cardio work up, but did not find anything. They also did an abdominal ultrasound and a CT scan and her records show hx of fatty liver and a liver cyst. Reports abdominal pain upon palpation.  Endorses eating regularly, tracking calories, increased recent water intake, and taking Tums. Endorses recent travel of 3 weeks ago to [place] , but states she felt fine at the time. Denies constipation, vaginal discharge, vaginal pain, or sick contacts.   Past Medical History:  Diagnosis Date   Anxiety    Arthritis    Colon polyps 12/09/2012   three polyps; [facility]; repeat in 3-5 years.   Depression    IBS (irritable bowel syndrome)    diarrhea predominant; s/p GI consult with colonoscopy   Migraine    menstrually related.   Obesity    Tremor, essential      Social History   Tobacco Use   Smoking status: Never   Smokeless tobacco: Never  Vaping Use   Vaping  status: Never Used  Substance Use Topics   Alcohol use: Not Currently    Comment: rarely   Drug use: No    Past Surgical History:  Procedure Laterality Date   APPENDECTOMY     COLONOSCOPY W/ POLYPECTOMY  12/09/2012   three polyps; [facility]. Repeat in 3-5 years.   Cyst resection perineal     LAPAROSCOPIC APPENDECTOMY N/A 02/16/2022   Procedure: APPENDECTOMY LAPAROSCOPIC;  Surgeon: Caralyn Chandler, MD;  Location: [facility];  Service: General;  Laterality: N/A;    Family History  Problem Relation Age of Onset   Breast cancer Mother 74       spread   Anxiety disorder Mother    Depression Mother    Atrial fibrillation Father    Stroke Father        multiple   Atrial fibrillation Sister    ADD / ADHD Daughter    Breast cancer Other        maternal great aunt dx in her 8s   Breast cancer Other        6 maternal great aunts dx in their 33s   Colon cancer Neg Hx    Esophageal cancer Neg Hx     No Known Allergies  Current Medications:   Current Outpatient Medications:    cetirizine  (ZYRTEC ) 10 MG tablet, Take 1 tablet (10 mg total) by  mouth daily., Disp: 30 tablet, Rfl: 11   meloxicam  (MOBIC ) 15 MG tablet, TAKE 1 TABLET BY MOUTH EVERY DAY WITH FOOD, Disp: 30 tablet, Rfl: 2   ondansetron  (ZOFRAN ) 4 MG tablet, Take 1 tablet (4 mg total) by mouth every 8 (eight) hours as needed for nausea or vomiting., Disp: 20 tablet, Rfl: 0   sertraline  (ZOLOFT ) 100 MG tablet, Take 100 mg by mouth daily., Disp: , Rfl:    gabapentin  (NEURONTIN ) 100 MG capsule, Take 2 capsules (200 mg total) by mouth at bedtime., Disp: 180 capsule, Rfl: 3   Review of Systems:   Negative unless otherwise specified per HPI.  Vitals:   Vitals:   10/07/23 1132  BP: 120/70  Pulse: 77  Temp: 97.9 F (36.6 C)  TempSrc: Temporal  SpO2: 95%  Weight: 206 lb (93.4 kg)  Height: 5' 2 (1.575 m)     Body mass index is 37.68 kg/m.  Physical Exam:   Physical Exam Vitals and nursing note reviewed.  Constitutional:       General: She is not in acute distress.    Appearance: She is well-developed. She is not ill-appearing or toxic-appearing.  Cardiovascular:     Rate and Rhythm: Normal rate and regular rhythm.     Pulses: Normal pulses.     Heart sounds: Normal heart sounds, S1 normal and S2 normal.  Pulmonary:     Effort: Pulmonary effort is normal.     Breath sounds: Normal breath sounds.  Abdominal:     General: Abdomen is flat. Bowel sounds are normal.     Palpations: Abdomen is soft.     Tenderness: There is abdominal tenderness in the epigastric area. There is no right CVA tenderness or left CVA tenderness.  Skin:    General: Skin is warm and dry.  Neurological:     Mental Status: She is alert.     GCS: GCS eye subscore is 4. GCS verbal subscore is 5. GCS motor subscore is 6.  Psychiatric:        Speech: Speech normal.        Behavior: Behavior normal. Behavior is cooperative.    Results for orders placed or performed in visit on 10/07/23  POCT urinalysis dipstick  Result Value Ref Range   Color, UA Yellow    Clarity, UA Clear    Glucose, UA Negative Negative   Bilirubin, UA Negative    Ketones, UA Negative    Spec Grav, UA >=1.030 (A) 1.010 - 1.025   Blood, UA Negative    pH, UA 6.0 5.0 - 8.0   Protein, UA Negative Negative   Urobilinogen, UA 0.2 0.2 or 1.0 E.U./dL   Nitrite, UA Negative    Leukocytes, UA Negative Negative   Appearance     Odor      Assessment and Plan:   1. Urinary frequency (Primary) Initial urinalysis is unremarkable Recommend urine culture to rule out true infection Will await results to assess need for antibiotic(s) unless develops new/worsening symptom(s)  - POCT urinalysis dipstick - Urine Culture  2. Chest pain, unspecified type EKG tracing is personally reviewed.  EKG notes NSR.  No acute changes.  Unclear etiology Will update blood work and assess ultrasound for possible gallbladder etiology Prior cardiac evaluation was >10 years ago; low  threshold to refer to cardiology if returns ER precautions advised - EKG 12-Lead - TSH - US  Abdomen Limited RUQ (LIVER/GB); Future  3. Nausea Unclear etiology Consider gastrointestinal viral illness, pyelonephritis, urinary tract infection (  UTI), gastritis, cholelithiasis, peptic ulcer disease, among others Recommend bland diet Trial Zofran  as needed  Will order abdomen ultrasound  Follow up based on results/symptom(s)  - CBC with Differential/Platelet - Comprehensive metabolic panel with GFR - Lipase - US  Abdomen Limited RUQ (LIVER/GB); Future   I, Timoteo Force, acting as a Neurosurgeon for [employer], [place]., have documented all relevant documentation on the behalf of Alexander Iba, [place], as directed by  Alexander Iba, PA while in the presence of Alexander Iba, [place].  I, Alexander Iba, [place], have reviewed all documentation for this visit. The documentation on 10/07/23 for the exam, diagnosis, procedures, and orders are all accurate and complete.  Alexander Iba, PA-C

## 2023-10-08 ENCOUNTER — Ambulatory Visit: Payer: Self-pay | Admitting: Physician Assistant

## 2023-10-08 ENCOUNTER — Ambulatory Visit (HOSPITAL_BASED_OUTPATIENT_CLINIC_OR_DEPARTMENT_OTHER)
Admission: RE | Admit: 2023-10-08 | Discharge: 2023-10-08 | Disposition: A | Discharge status: Home / Self Care | Source: Ambulatory Visit | Attending: Physician Assistant | Admitting: Physician Assistant

## 2023-10-08 DIAGNOSIS — R110 Nausea: Secondary | ICD-10-CM | POA: Diagnosis present

## 2023-10-08 DIAGNOSIS — R079 Chest pain, unspecified: Secondary | ICD-10-CM | POA: Insufficient documentation

## 2023-10-08 LAB — COMPREHENSIVE METABOLIC PANEL WITH GFR
AG Ratio: 1.6 (calc) (ref 1.0–2.5)
ALT: 39 U/L — ABNORMAL HIGH (ref 6–29)
AST: 25 U/L (ref 10–35)
Albumin: 4.6 g/dL (ref 3.6–5.1)
Alkaline phosphatase (APISO): 88 U/L (ref 37–153)
BUN: 16 mg/dL (ref 7–25)
CO2: 27 mmol/L (ref 20–32)
Calcium: 9.9 mg/dL (ref 8.6–10.4)
Chloride: 99 mmol/L (ref 98–110)
Creat: 0.73 mg/dL (ref 0.50–1.05)
Globulin: 2.9 g/dL (ref 1.9–3.7)
Glucose, Bld: 87 mg/dL (ref 65–99)
Potassium: 4.1 mmol/L (ref 3.5–5.3)
Sodium: 138 mmol/L (ref 135–146)
Total Bilirubin: 0.6 mg/dL (ref 0.2–1.2)
Total Protein: 7.5 g/dL (ref 6.1–8.1)
eGFR: 94 mL/min/{1.73_m2} (ref >=60)

## 2023-10-08 LAB — CBC WITH DIFFERENTIAL/PLATELET
Absolute Lymphocytes: 3094 {cells}/uL (ref 850–3900)
Absolute Monocytes: 559 {cells}/uL (ref 200–950)
Basophils Absolute: 49 {cells}/uL (ref 0–200)
Basophils Relative: 0.6 %
Eosinophils Absolute: 138 {cells}/uL (ref 15–500)
Eosinophils Relative: 1.7 %
HCT: 42.1 % (ref 35.0–45.0)
Hemoglobin: 13.6 g/dL (ref 11.7–15.5)
MCH: 29.8 pg (ref 27.0–33.0)
MCHC: 32.3 g/dL (ref 32.0–36.0)
MCV: 92.1 fL (ref 80.0–100.0)
MPV: 9.3 fL (ref 7.5–12.5)
Monocytes Relative: 6.9 %
Neutro Abs: 4261 {cells}/uL (ref 1500–7800)
Neutrophils Relative %: 52.6 %
Platelets: 318 10*3/uL (ref 140–400)
RBC: 4.57 10*6/uL (ref 3.80–5.10)
RDW: 14.2 % (ref 11.0–15.0)
Total Lymphocyte: 38.2 %
WBC: 8.1 10*3/uL (ref 3.8–10.8)

## 2023-10-08 LAB — URINE CULTURE
MICRO NUMBER:: 16567279
Result:: NO GROWTH
SPECIMEN QUALITY:: ADEQUATE

## 2023-10-08 LAB — TSH: TSH: 1.91 m[IU]/L (ref 0.40–4.50)

## 2023-10-08 LAB — LIPASE: Lipase: 48 U/L (ref 7–60)

## 2023-12-31 ENCOUNTER — Other Ambulatory Visit: Payer: Self-pay | Admitting: Physician Assistant

## 2023-12-31 NOTE — Telephone Encounter (Signed)
 Last Visit: 611/25  Next Visit: none  Prev filled by historical provider

## 2024-01-02 ENCOUNTER — Other Ambulatory Visit: Payer: Self-pay | Admitting: Physician Assistant

## 2024-01-02 DIAGNOSIS — F411 Generalized anxiety disorder: Secondary | ICD-10-CM

## 2024-01-02 DIAGNOSIS — F3342 Major depressive disorder, recurrent, in full remission: Secondary | ICD-10-CM

## 2024-01-04 NOTE — Telephone Encounter (Signed)
 Last Visit: 10/07/23  Next Visit: none  Last Filled: 11/21/22  Quantity: 180

## 2024-01-28 ENCOUNTER — Encounter: Payer: Self-pay | Admitting: Physician Assistant

## 2024-01-29 ENCOUNTER — Other Ambulatory Visit: Payer: Self-pay | Admitting: Physician Assistant

## 2024-01-29 DIAGNOSIS — F3342 Major depressive disorder, recurrent, in full remission: Secondary | ICD-10-CM

## 2024-01-29 DIAGNOSIS — F411 Generalized anxiety disorder: Secondary | ICD-10-CM

## 2024-01-29 MED ORDER — GABAPENTIN 100 MG PO CAPS: 200.0000 mg | ORAL_CAPSULE | Freq: Every day | ORAL | 0 refills | Status: DC

## 2024-01-29 MED ORDER — SERTRALINE HCL 100 MG PO TABS: 100.0000 mg | ORAL_TABLET | Freq: Every day | ORAL | 0 refills | Status: DC

## 2024-01-29 NOTE — Telephone Encounter (Signed)
 Copied from CRM [id number]. Topic: Clinical - Medication Refill >> Jan 29, 2024  8:41 AM Ahlexyia S wrote: Medication:  gabapentin  (NEURONTIN ) 100 MG capsule sertraline  (ZOLOFT ) 100 MG tablet  Has the patient contacted their pharmacy? No, pt stated she know she doesn't have any refills remaining. (Agent: If no, request that the patient contact the pharmacy for the refill. If patient does not wish to contact the pharmacy document the reason why and proceed with request.) (Agent: If yes, when and what did the pharmacy advise?)  This is the patient's preferred pharmacy:  [id number] [place], [place] - [address] [place] [address] Phone: [phone] Fax: [phone]  Is this the correct pharmacy for this prescription? Yes If no, delete pharmacy and type the correct one.   Has the prescription been filled recently? No  Is the patient out of the medication? Yes  Has the patient been seen for an appointment in the last year OR does the patient have an upcoming appointment? Yes  Can we respond through MyChart? Yes  Agent: Please be advised that Rx refills may take up to 3 business days. We ask that you follow-up with your pharmacy.

## 2024-01-29 NOTE — Telephone Encounter (Signed)
 Please see pt msg regarding refills

## 2024-01-29 NOTE — Telephone Encounter (Signed)
 Prescriptions previously denied by PCP, please see previous msg sent via MyChart and advise on refills.

## 2024-02-20 ENCOUNTER — Other Ambulatory Visit: Payer: Self-pay | Admitting: Physician Assistant

## 2024-02-24 ENCOUNTER — Other Ambulatory Visit: Payer: Self-pay | Admitting: Physician Assistant

## 2024-02-25 ENCOUNTER — Encounter: Payer: Self-pay | Admitting: Physician Assistant

## 2024-02-25 ENCOUNTER — Telehealth: Admitting: Physician Assistant

## 2024-02-25 VITALS — Ht 62.0 in | Wt 206.0 lb

## 2024-02-25 DIAGNOSIS — M170 Bilateral primary osteoarthritis of knee: Secondary | ICD-10-CM | POA: Diagnosis not present

## 2024-02-25 DIAGNOSIS — Z1211 Encounter for screening for malignant neoplasm of colon: Secondary | ICD-10-CM | POA: Insufficient documentation

## 2024-02-25 DIAGNOSIS — R55 Syncope and collapse: Secondary | ICD-10-CM | POA: Insufficient documentation

## 2024-02-25 DIAGNOSIS — R112 Nausea with vomiting, unspecified: Secondary | ICD-10-CM | POA: Insufficient documentation

## 2024-02-25 DIAGNOSIS — K529 Noninfective gastroenteritis and colitis, unspecified: Secondary | ICD-10-CM | POA: Insufficient documentation

## 2024-02-25 DIAGNOSIS — R197 Diarrhea, unspecified: Secondary | ICD-10-CM | POA: Insufficient documentation

## 2024-02-25 DIAGNOSIS — F3342 Major depressive disorder, recurrent, in full remission: Secondary | ICD-10-CM

## 2024-02-25 DIAGNOSIS — F411 Generalized anxiety disorder: Secondary | ICD-10-CM

## 2024-02-25 MED ORDER — GABAPENTIN 100 MG PO CAPS: 200.0000 mg | ORAL_CAPSULE | Freq: Every day | ORAL | 3 refills | Status: AC

## 2024-02-25 MED ORDER — SERTRALINE HCL 100 MG PO TABS: 100.0000 mg | ORAL_TABLET | Freq: Every day | ORAL | 3 refills | Status: AC

## 2024-02-25 MED ORDER — MELOXICAM 15 MG PO TABS: 15.0000 mg | ORAL_TABLET | Freq: Every day | ORAL | 2 refills | Status: AC

## 2024-02-25 NOTE — Progress Notes (Signed)
   Virtual Visit via Video Note  I connected with  Sandra Mills  on 02/25/24 at 11:00 AM EDT by a video enabled telemedicine application and verified that I am speaking with the correct person using two identifiers.  Location: Patient: work (school) in [employer] Provider: Nature Conservation Officer at [employer] Persons present: Patient and myself   I discussed the limitations of evaluation and management by telemedicine and the availability of in person appointments. The patient expressed understanding and agreed to proceed.   History of Present Illness:  Discussed the use of AI scribe software for clinical note transcription with the patient, who gave verbal consent to proceed.  History of Present Illness Sandra Mills is a 63 year old female who presents for a virtual follow-up visit for anxiety and depression.  She has been stable on Zoloft  100 mg daily and gabapentin  200 mg at bedtime since 2018 for her anxiety and depression. She is concerned about the potential consequences of discontinuing her medications abruptly.  No current symptoms of depression or anxiety are reported. No little interest or pleasure in doing things, feeling down, depressed, or hopeless, feeling tired or having little energy, feeling bad about herself, having trouble concentrating, moving or speaking slowly, or having thoughts of self-harm. She reports occasional trouble falling asleep and acknowledges having a good appetite, sometimes overeating.  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     Observations/Objective:   Gen: Awake, alert, no acute distress Resp: Breathing is even and non-labored Psych: calm/pleasant demeanor Neuro: Alert and Oriented x 3, + facial symmetry, speech is clear.   Assessment and Plan:  Assessment and Plan Assessment & Plan Generalized anxiety disorder and depression Well-managed on Zoloft  100 mg daily and gabapentin  200 mg at bedtime since  2018. Reports stability with current medication regimen. No significant depressive symptoms on PHQ-9 assessment, except occasional difficulty falling asleep. No suicidal ideation or significant anxiety symptoms reported. - Continue Zoloft  100 mg daily. - Continue gabapentin  200 mg at bedtime. - [facility] [address] [facility] supply.  Bilateral primary osteoarthritis of knee Increased knee pain due to osteoarthritis, leading to more frequent use of meloxicam . Reports meloxicam  is effective for pain management. - Continue meloxicam  as needed for knee pain, ensuring it is taken with food. - Advised against concurrent use of other NSAIDs such as Aleve or ibuprofen. - Sent prescription for meloxicam  with a 30-day supply and a couple of refills.    Follow Up Instructions:    I discussed the assessment and treatment plan with the patient. The patient was provided an opportunity to ask questions and all were answered. The patient agreed with the plan and demonstrated an understanding of the instructions.   The patient was advised to call back or seek an in-person evaluation if the symptoms worsen or if the condition fails to improve as anticipated.  Christopherjame Carnell M Demontray Franta, PA-C

## 2024-02-25 NOTE — Telephone Encounter (Signed)
She is scheduled today

## 2024-02-25 NOTE — Telephone Encounter (Signed)
 Please see pt msg and advise on refills

## 2024-02-25 NOTE — Patient Instructions (Signed)
  VISIT SUMMARY: You had a virtual follow-up visit to discuss your anxiety, depression, and knee pain due to osteoarthritis. Your current medications for anxiety and depression are working well, and you have no significant symptoms. You also discussed your knee pain and the use of meloxicam  for relief.  YOUR PLAN: GENERALIZED ANXIETY DISORDER AND DEPRESSION: Your anxiety and depression are well-managed with your current medications, Zoloft  and gabapentin . You have no significant symptoms at this time. -Continue taking Zoloft  100 mg daily. -Continue taking gabapentin  200 mg at bedtime. -Prescriptions for a 90-day supply of [facility] [address].  BILATERAL PRIMARY OSTEOARTHRITIS OF KNEE: You have increased knee pain due to osteoarthritis and have been using meloxicam  for relief. -Continue taking meloxicam  as needed for knee pain, and make sure to take it with food. -Avoid using other NSAIDs like Aleve or ibuprofen while taking meloxicam . -A prescription for a 30-day supply of meloxicam  with refills has been sent.                      Contains text generated by [employer]bridge.                                 Contains text generated by Abridge.

## 2024-04-01 ENCOUNTER — Encounter: Admitting: Physician Assistant
